# Patient Record
Sex: Male | Born: 1960 | Race: Black or African American | Hispanic: No | Smoking: Former smoker
Health system: Southern US, Community
[De-identification: ages and names within clinical notes are randomized; demographics above are authoritative.]

## PROBLEM LIST (undated history)

## (undated) DIAGNOSIS — I10 Essential (primary) hypertension: Secondary | ICD-10-CM

## (undated) DIAGNOSIS — I480 Paroxysmal atrial fibrillation: Secondary | ICD-10-CM

## (undated) DIAGNOSIS — T8610 Unspecified complication of kidney transplant: Secondary | ICD-10-CM

## (undated) DIAGNOSIS — Z8639 Personal history of other endocrine, nutritional and metabolic disease: Secondary | ICD-10-CM

## (undated) DIAGNOSIS — I2510 Atherosclerotic heart disease of native coronary artery without angina pectoris: Secondary | ICD-10-CM

## (undated) DIAGNOSIS — Z87438 Personal history of other diseases of male genital organs: Secondary | ICD-10-CM

## (undated) DIAGNOSIS — D631 Anemia in chronic kidney disease: Secondary | ICD-10-CM

## (undated) DIAGNOSIS — R112 Nausea with vomiting, unspecified: Secondary | ICD-10-CM

## (undated) DIAGNOSIS — Z992 Dependence on renal dialysis: Secondary | ICD-10-CM

## (undated) DIAGNOSIS — K219 Gastro-esophageal reflux disease without esophagitis: Secondary | ICD-10-CM

## (undated) DIAGNOSIS — I5022 Chronic systolic (congestive) heart failure: Secondary | ICD-10-CM

## (undated) DIAGNOSIS — R011 Cardiac murmur, unspecified: Secondary | ICD-10-CM

## (undated) DIAGNOSIS — N186 End stage renal disease: Secondary | ICD-10-CM

## (undated) DIAGNOSIS — N189 Chronic kidney disease, unspecified: Secondary | ICD-10-CM

## (undated) DIAGNOSIS — I428 Other cardiomyopathies: Secondary | ICD-10-CM

## (undated) DIAGNOSIS — Z8719 Personal history of other diseases of the digestive system: Secondary | ICD-10-CM

## (undated) HISTORY — PX: BACK SURGERY: SHX140

## (undated) HISTORY — PX: CARDIOVERSION: SHX1299

## (undated) HISTORY — PX: CARDIOVASCULAR STRESS TEST: SHX262

---

## 1999-01-20 HISTORY — PX: ANTERIOR CERVICAL DECOMP/DISCECTOMY FUSION: SHX1161

## 2009-09-21 HISTORY — PX: KIDNEY TRANSPLANT: SHX239

## 2009-10-21 HISTORY — PX: ESOPHAGOGASTRODUODENOSCOPY: SHX1529

## 2009-10-21 HISTORY — PX: COLONOSCOPY W/ POLYPECTOMY: SHX1380

## 2009-10-26 ENCOUNTER — Ambulatory Visit: Payer: Self-pay | Admitting: Internal Medicine

## 2009-10-26 ENCOUNTER — Inpatient Hospital Stay (HOSPITAL_COMMUNITY): Admission: EM | Admit: 2009-10-26 | Discharge: 2009-10-28 | Payer: Self-pay | Admitting: Emergency Medicine

## 2009-10-27 ENCOUNTER — Encounter: Payer: Self-pay | Admitting: Internal Medicine

## 2009-10-29 ENCOUNTER — Encounter: Payer: Self-pay | Admitting: Internal Medicine

## 2011-02-22 LAB — CBC
HCT: 20.2 % — ABNORMAL LOW (ref 39.0–52.0)
HCT: 25.5 % — ABNORMAL LOW (ref 39.0–52.0)
HCT: 27.9 % — ABNORMAL LOW (ref 39.0–52.0)
Hemoglobin: 5.8 g/dL — CL (ref 13.0–17.0)
Hemoglobin: 6.9 g/dL — CL (ref 13.0–17.0)
Hemoglobin: 9.6 g/dL — ABNORMAL LOW (ref 13.0–17.0)
MCHC: 32.9 g/dL (ref 30.0–36.0)
MCHC: 34.3 g/dL (ref 30.0–36.0)
MCHC: 34.5 g/dL (ref 30.0–36.0)
MCHC: 34.6 g/dL (ref 30.0–36.0)
MCV: 93 fL (ref 78.0–100.0)
MCV: 93 fL (ref 78.0–100.0)
MCV: 93.2 fL (ref 78.0–100.0)
MCV: 94.2 fL (ref 78.0–100.0)
MCV: 94.4 fL (ref 78.0–100.0)
Platelets: 110 10*3/uL — ABNORMAL LOW (ref 150–400)
Platelets: 123 10*3/uL — ABNORMAL LOW (ref 150–400)
Platelets: 125 10*3/uL — ABNORMAL LOW (ref 150–400)
Platelets: 126 10*3/uL — ABNORMAL LOW (ref 150–400)
Platelets: 202 10*3/uL (ref 150–400)
RBC: 2.8 MIL/uL — ABNORMAL LOW (ref 4.22–5.81)
RDW: 16.2 % — ABNORMAL HIGH (ref 11.5–15.5)
RDW: 16.2 % — ABNORMAL HIGH (ref 11.5–15.5)
RDW: 16.2 % — ABNORMAL HIGH (ref 11.5–15.5)
RDW: 19.3 % — ABNORMAL HIGH (ref 11.5–15.5)
WBC: 5.8 10*3/uL (ref 4.0–10.5)
WBC: 7.6 10*3/uL (ref 4.0–10.5)
WBC: 8 10*3/uL (ref 4.0–10.5)

## 2011-02-22 LAB — COMPREHENSIVE METABOLIC PANEL
Albumin: 2.9 g/dL — ABNORMAL LOW (ref 3.5–5.2)
Alkaline Phosphatase: 107 U/L (ref 39–117)
BUN: 57 mg/dL — ABNORMAL HIGH (ref 6–23)
Creatinine, Ser: 3.05 mg/dL — ABNORMAL HIGH (ref 0.4–1.5)
Potassium: 5.9 mEq/L — ABNORMAL HIGH (ref 3.5–5.1)
Total Protein: 5.3 g/dL — ABNORMAL LOW (ref 6.0–8.3)

## 2011-02-22 LAB — CROSSMATCH: Antibody Screen: NEGATIVE

## 2011-02-22 LAB — URINALYSIS, ROUTINE W REFLEX MICROSCOPIC
Glucose, UA: NEGATIVE mg/dL
Specific Gravity, Urine: 1.014 (ref 1.005–1.030)
Urobilinogen, UA: 0.2 mg/dL (ref 0.0–1.0)

## 2011-02-22 LAB — APTT: aPTT: 24 seconds (ref 24–37)

## 2011-02-22 LAB — URINE MICROSCOPIC-ADD ON

## 2011-02-22 LAB — POCT I-STAT, CHEM 8
Calcium, Ion: 1.49 mmol/L — ABNORMAL HIGH (ref 1.12–1.32)
Chloride: 120 mEq/L — ABNORMAL HIGH (ref 96–112)
Glucose, Bld: 133 mg/dL — ABNORMAL HIGH (ref 70–99)
HCT: 17 % — ABNORMAL LOW (ref 39.0–52.0)
TCO2: 13 mmol/L (ref 0–100)

## 2011-02-22 LAB — DIFFERENTIAL
Lymphs Abs: 0.3 10*3/uL — ABNORMAL LOW (ref 0.7–4.0)
Monocytes Relative: 6 % (ref 3–12)
Neutro Abs: 8.7 10*3/uL — ABNORMAL HIGH (ref 1.7–7.7)
Neutrophils Relative %: 89 % — ABNORMAL HIGH (ref 43–77)

## 2011-02-22 LAB — RENAL FUNCTION PANEL
Albumin: 2.5 g/dL — ABNORMAL LOW (ref 3.5–5.2)
Calcium: 9.2 mg/dL (ref 8.4–10.5)
Creatinine, Ser: 2.95 mg/dL — ABNORMAL HIGH (ref 0.4–1.5)
GFR calc Af Amer: 28 mL/min — ABNORMAL LOW (ref >60)
GFR calc non Af Amer: 23 mL/min — ABNORMAL LOW (ref >60)
Phosphorus: 3.3 mg/dL (ref 2.3–4.6)

## 2011-02-22 LAB — BASIC METABOLIC PANEL
Calcium: 9.2 mg/dL (ref 8.4–10.5)
Calcium: 9.4 mg/dL (ref 8.4–10.5)
Chloride: 115 mEq/L — ABNORMAL HIGH (ref 96–112)
Creatinine, Ser: 3.02 mg/dL — ABNORMAL HIGH (ref 0.4–1.5)
Creatinine, Ser: 3.1 mg/dL — ABNORMAL HIGH (ref 0.4–1.5)
GFR calc Af Amer: 26 mL/min — ABNORMAL LOW (ref >60)
GFR calc non Af Amer: 22 mL/min — ABNORMAL LOW (ref >60)
Glucose, Bld: 102 mg/dL — ABNORMAL HIGH (ref 70–99)
Sodium: 136 mEq/L (ref 135–145)
Sodium: 138 mEq/L (ref 135–145)

## 2011-02-22 LAB — MRSA PCR SCREENING

## 2011-02-22 LAB — GLUCOSE, CAPILLARY: Glucose-Capillary: 83 mg/dL (ref 70–99)

## 2011-02-22 LAB — PROTIME-INR: INR: 1.41 (ref 0.00–1.49)

## 2011-02-22 LAB — HAPTOGLOBIN: Haptoglobin: 127 mg/dL (ref 16–200)

## 2013-11-21 HISTORY — PX: RENAL BIOPSY: SHX156

## 2013-12-02 ENCOUNTER — Encounter (HOSPITAL_COMMUNITY): Payer: Self-pay | Admitting: Emergency Medicine

## 2013-12-02 ENCOUNTER — Emergency Department (INDEPENDENT_AMBULATORY_CARE_PROVIDER_SITE_OTHER): Payer: Medicare Other

## 2013-12-02 ENCOUNTER — Emergency Department (INDEPENDENT_AMBULATORY_CARE_PROVIDER_SITE_OTHER)
Admission: EM | Admit: 2013-12-02 | Discharge: 2013-12-02 | Disposition: A | Discharge status: Home / Self Care | Payer: Medicare Other | Source: Home / Self Care | Attending: Emergency Medicine | Admitting: Emergency Medicine

## 2013-12-02 DIAGNOSIS — S93409A Sprain of unspecified ligament of unspecified ankle, initial encounter: Secondary | ICD-10-CM

## 2013-12-02 DIAGNOSIS — Y939 Activity, unspecified: Secondary | ICD-10-CM

## 2013-12-02 DIAGNOSIS — X500XXA Overexertion from strenuous movement or load, initial encounter: Secondary | ICD-10-CM

## 2013-12-02 MED ORDER — HYDROCODONE-ACETAMINOPHEN 5-325 MG PO TABS: 1.0000 | ORAL_TABLET | Freq: Four times a day (QID) | ORAL | Status: DC | PRN

## 2013-12-02 NOTE — ED Provider Notes (Signed)
Medical screening examination/treatment/procedure(s) were performed by non-physician practitioner and as supervising physician I was immediately available for consultation/collaboration.  Leslee Homeavid Lionardo Haze, M.D.  Reuben Likesavid C Cherokee Boccio, MD 12/02/13 [phone]

## 2013-12-02 NOTE — ED Notes (Signed)
Reports injury to right ankle.  States that over the past two weeks when stepping down ankle has rolled several times. Now having pain and swelling  Also c/o pain in right shoulder. Hx of collar bone fracture. Denies injury.  Pain with rotating arm inward.  Pt has tried otc meds for pain with no relief.

## 2013-12-02 NOTE — Discharge Instructions (Signed)
Ankle Sprain °An ankle sprain is an injury to the strong, fibrous tissues (ligaments) that hold the bones of your ankle joint together.  °CAUSES °An ankle sprain is usually caused by a fall or by twisting your ankle. Ankle sprains most commonly occur when you step on the outer edge of your foot, and your ankle turns inward. People who participate in sports are more prone to these types of injuries.  °SYMPTOMS  °· Pain in your ankle. The pain may be present at rest or only when you are trying to stand or walk. °· Swelling. °· Bruising. Bruising may develop immediately or within 1 to 2 days after your injury. °· Difficulty standing or walking, particularly when turning corners or changing directions. °DIAGNOSIS  °Your caregiver will ask you details about your injury and perform a physical exam of your ankle to determine if you have an ankle sprain. During the physical exam, your caregiver will press on and apply pressure to specific areas of your foot and ankle. Your caregiver will try to move your ankle in certain ways. An X-ray exam may be done to be sure a bone was not broken or a ligament did not separate from one of the bones in your ankle (avulsion fracture).  °TREATMENT  °Certain types of braces can help stabilize your ankle. Your caregiver can make a recommendation for this. Your caregiver may recommend the use of medicine for pain. If your sprain is severe, your caregiver may refer you to a surgeon who helps to restore function to parts of your skeletal system (orthopedist) or a physical therapist. °HOME CARE INSTRUCTIONS  °· Apply ice to your injury for 1 2 days or as directed by your caregiver. Applying ice helps to reduce inflammation and pain. °· Put ice in a plastic bag. °· Place a towel between your skin and the bag. °· Leave the ice on for 15-20 minutes at a time, every 2 hours while you are awake. °· Only take over-the-counter or prescription medicines for pain, discomfort, or fever as directed by  your caregiver. °· Elevate your injured ankle above the level of your heart as much as possible for 2 3 days. °· If your caregiver recommends crutches, use them as instructed. Gradually put weight on the affected ankle. Continue to use crutches or a cane until you can walk without feeling pain in your ankle. °· If you have a plaster splint, wear the splint as directed by your caregiver. Do not rest it on anything harder than a pillow for the first 24 hours. Do not put weight on it. Do not get it wet. You may take it off to take a shower or bath. °· You may have been given an elastic bandage to wear around your ankle to provide support. If the elastic bandage is too tight (you have numbness or tingling in your foot or your foot becomes cold and blue), adjust the bandage to make it comfortable. °· If you have an air splint, you may blow more air into it or let air out to make it more comfortable. You may take your splint off at night and before taking a shower or bath. Wiggle your toes in the splint several times per day to decrease swelling. °SEEK MEDICAL CARE IF:  °· You have rapidly increasing bruising or swelling. °· Your toes feel extremely cold or you lose feeling in your foot. °· Your pain is not relieved with medicine. °SEEK IMMEDIATE MEDICAL CARE IF: °· Your toes are numb   or blue.  You have severe pain that is increasing. MAKE SURE YOU:   Understand these instructions.  Will watch your condition.  Will get help right away if you are not doing well or get worse. Document Released: 11/07/2005 Document Revised: 08/01/2012 Document Reviewed: 11/19/2011 [facility] Patient Information 2014 [employer], [place].  Contact Dr. Ophelia CharterYates for follow up evaluation. Wear splint and shoe during daytime and use crutches during daytime. Remove splint and shoe at night while sleeping. Ice and elevated injured ankle 2-3 x day.

## 2013-12-02 NOTE — ED Provider Notes (Signed)
CSN: [id number]     Arrival date & time 12/02/13  1302 History   First MD Initiated Contact with Patient 12/02/13 1348     Chief Complaint  Patient presents with  . Ankle Injury  . Shoulder Pain   (Consider location/radiation/quality/duration/timing/severity/associated sxs/prior Treatment) HPI Comments: States he has "rolled this ankle" a few times over the past 2 weeks and this morning while stepping out of his truck, he stepped down on a rock and began to drop something. In the process of trying to catch what he dropped, he again injured his right ankle, pronating right foot. Denies previous surgery.   Patient is a 53 y.o. male presenting with lower extremity injury and shoulder pain. The history is provided by the patient.  Ankle Injury  Shoulder Pain    History reviewed. No pertinent past medical history. Past Surgical History  Procedure Laterality Date  . Nephrectomy transplanted organ    . Back surgery     History reviewed. No pertinent family history. History  Substance Use Topics  . Smoking status: Never Smoker   . Smokeless tobacco: Not on file  . Alcohol Use: No    Review of Systems  All other systems reviewed and are negative.    Allergies  Review of patient's allergies indicates no known allergies.  Home Medications   Current Outpatient Rx  Name  Route  Sig  Dispense  Refill  . cholecalciferol (VITAMIN D) 1000 UNITS tablet   Oral   Take 1,000 Units by mouth daily.         Marland Kitchen. esomeprazole (NEXIUM) 40 MG capsule   Oral   Take 40 mg by mouth daily at 12 noon.         Marland Kitchen. lisinopril (PRINIVIL,ZESTRIL) 5 MG tablet   Oral   Take 5 mg by mouth daily.         . prednisoLONE 5 MG TABS tablet   Oral   Take by mouth.         Marland Kitchen. TACROLIMUS PO   Oral   Take by mouth.          BP 153/71  Pulse 90  Temp(Src) 98.8 F (37.1 C) (Oral)  Resp 16  SpO2 100% Physical Exam  Nursing note and vitals reviewed. Constitutional: He is oriented to person,  place, and time. He appears well-developed and well-nourished. No distress.  HENT:  Head: Normocephalic and atraumatic.  Eyes: Conjunctivae are normal.  Cardiovascular: Normal rate.   Pulmonary/Chest: Effort normal.  Musculoskeletal: Normal range of motion.       Right ankle: He exhibits swelling. He exhibits normal range of motion, no ecchymosis, no deformity, no laceration and normal pulse. Tenderness. Lateral malleolus tenderness found. No medial malleolus tenderness found. Achilles tendon normal.       Feet:  Neurological: He is alert and oriented to person, place, and time.  Skin: Skin is warm and dry.  +intact  Psychiatric: He has a normal mood and affect. His behavior is normal.    ED Course  Procedures (including critical care time) Labs Review Labs Reviewed - No data to display Imaging Review No results found.  EKG Interpretation    Date/Time:    Ventricular Rate:    PR Interval:    QRS Duration:   QT Interval:    QTC Calculation:   R Axis:     Text Interpretation:              MDM  Xrays suggestive of ankle sprain  with small avulsion injury to calcaneal bone spur. Advised patient ice, elevation, crutches as needed and follow up with Dr. Ophelia Charter. ASO and post op shoe applied at [facility]. Referral to Dr. Ophelia Charter for follow up.     Jess Barters [place], [place] 12/02/13 1452

## 2013-12-04 NOTE — ED Notes (Signed)
[facility] and asked for the name or the doctor to whom we referred and the phone number.  D/C instructions reprinted and given to the pt. Vassie MoselleYork, Sonja Manseau M 12/04/2013

## 2013-12-13 ENCOUNTER — Emergency Department (INDEPENDENT_AMBULATORY_CARE_PROVIDER_SITE_OTHER)
Admission: EM | Admit: 2013-12-13 | Discharge: 2013-12-13 | Disposition: A | Discharge status: Home / Self Care | Payer: Medicare Other | Source: Home / Self Care

## 2013-12-13 ENCOUNTER — Encounter (HOSPITAL_COMMUNITY): Payer: Self-pay | Admitting: Emergency Medicine

## 2013-12-13 DIAGNOSIS — L02419 Cutaneous abscess of limb, unspecified: Secondary | ICD-10-CM

## 2013-12-13 DIAGNOSIS — L02416 Cutaneous abscess of left lower limb: Secondary | ICD-10-CM

## 2013-12-13 DIAGNOSIS — L03119 Cellulitis of unspecified part of limb: Secondary | ICD-10-CM

## 2013-12-13 MED ORDER — CLINDAMYCIN HCL 150 MG PO CAPS: 300.0000 mg | ORAL_CAPSULE | Freq: Two times a day (BID) | ORAL | Status: DC

## 2013-12-13 MED ORDER — TRAMADOL HCL 50 MG PO TABS: 50.0000 mg | ORAL_TABLET | Freq: Four times a day (QID) | ORAL | Status: DC | PRN

## 2013-12-13 NOTE — Discharge Instructions (Signed)
Abscess °An abscess is an infected area that contains a collection of pus and debris. It can occur in almost any part of the body. An abscess is also known as a furuncle or boil. °CAUSES  °An abscess occurs when tissue gets infected. This can occur from blockage of oil or sweat glands, infection of hair follicles, or a minor injury to the skin. As the body tries to fight the infection, pus collects in the area and creates pressure under the skin. This pressure causes pain. People with weakened immune systems have difficulty fighting infections and get certain abscesses more often.  °SYMPTOMS °Usually an abscess develops on the skin and becomes a painful mass that is red, warm, and tender. If the abscess forms under the skin, you may feel a moveable soft area under the skin. Some abscesses break open (rupture) on their own, but most will continue to get worse without care. The infection can spread deeper into the body and eventually into the bloodstream, causing you to feel ill.  °DIAGNOSIS  °Your caregiver will take your medical history and perform a physical exam. A sample of fluid may also be taken from the abscess to determine what is causing your infection. °TREATMENT  °Your caregiver may prescribe antibiotic medicines to fight the infection. However, taking antibiotics alone usually does not cure an abscess. Your caregiver may need to make a small cut (incision) in the abscess to drain the pus. In some cases, gauze is packed into the abscess to reduce pain and to continue draining the area. °HOME CARE INSTRUCTIONS  °· Only take over-the-counter or prescription medicines for pain, discomfort, or fever as directed by your caregiver. °· If you were prescribed antibiotics, take them as directed. Finish them even if you start to feel better. °· If gauze is used, follow your caregiver's directions for changing the gauze. °· To avoid spreading the infection: °· Keep your draining abscess covered with a  bandage. °· Wash your hands well. °· Do not share personal care items, towels, or whirlpools with others. °· Avoid skin contact with others. °· Keep your skin and clothes clean around the abscess. °· Keep all follow-up appointments as directed by your caregiver. °SEEK MEDICAL CARE IF:  °· You have increased pain, swelling, redness, fluid drainage, or bleeding. °· You have muscle aches, chills, or a general ill feeling. °· You have a fever. °MAKE SURE YOU:  °· Understand these instructions. °· Will watch your condition. °· Will get help right away if you are not doing well or get worse. °Document Released: 08/17/2005 Document Revised: 05/08/2012 Document Reviewed: 01/20/2012 °ExitCare® Patient Information ©2014 [employer], LLC. ° °Cellulitis °Cellulitis is an infection of the skin and the tissue under the skin. The infected area is usually red and tender. This happens most often in the arms and lower legs. °HOME CARE  °· Take your antibiotic medicine as told. Finish the medicine even if you start to feel better. °· Keep the infected arm or leg raised (elevated). °· Put a warm cloth on the area up to 4 times per day. °· Only take medicines as told by your doctor. °· Keep all doctor visits as told. °GET HELP RIGHT AWAY IF:  °· You have a fever. °· You feel very sleepy. °· You throw up (vomit) or have watery poop (diarrhea). °· You feel sick and have muscle aches and pains. °· You see red streaks on the skin coming from the infected area. °· Your red area gets bigger or turns   a dark color. °· Your bone or joint under the infected area is painful after the skin heals. °· Your infection comes back in the same area or different area. °· You have a puffy (swollen) bump in the infected area. °· You have new symptoms. °MAKE SURE YOU:  °· Understand these instructions. °· Will watch your condition. °· Will get help right away if you are not doing well or get worse. °Document Released: 04/25/2008 Document Revised: 05/08/2012  Document Reviewed: 01/23/2012 °ExitCare® Patient Information ©2014 [employer], LLC. ° °

## 2013-12-13 NOTE — ED Notes (Signed)
Discussed importance of prompt attention for infections in future due to transplant history

## 2013-12-13 NOTE — ED Notes (Signed)
States 3-5 day duration of red sore area on left thigh. Has been using warm compresses. Area aprox 10 cm measured of redness, pain w palpation, hot to touch

## 2013-12-13 NOTE — ED Provider Notes (Signed)
CSN: [id number]     Arrival date & time 12/13/13  1230 History   First MD Initiated Contact with Patient 12/13/13 1339     Chief Complaint  Patient presents with  . Cellulitis   (Consider location/radiation/quality/duration/timing/severity/associated sxs/prior Treatment) HPI Comments: 53 year old male presents with redness and soreness to the left posterior thigh. It began as a small pustule 3 days ago but has increased in size with induration, tenderness and erythema. He denies other areas of tenderness or erythema. Denies systemic symptoms. Patient had a kidney transplant approximate 5 years ago and is currently taking Tacrolimus and Prednisone.   History reviewed. No pertinent past medical history. Past Surgical History  Procedure Laterality Date  . Nephrectomy transplanted organ    . Back surgery     History reviewed. No pertinent family history. History  Substance Use Topics  . Smoking status: Never Smoker   . Smokeless tobacco: Not on file  . Alcohol Use: No    Review of Systems  Skin: Positive for wound.       As per history of present illness  All other systems reviewed and are negative.    Allergies  Review of patient's allergies indicates no known allergies.  Home Medications   Current Outpatient Rx  Name  Route  Sig  Dispense  Refill  . cholecalciferol (VITAMIN D) 1000 UNITS tablet   Oral   Take 1,000 Units by mouth daily.         . clindamycin (CLEOCIN) 150 MG capsule   Oral   Take 2 capsules (300 mg total) by mouth 2 (two) times daily. X 10 days   20 capsule   0   . esomeprazole (NEXIUM) 40 MG capsule   Oral   Take 40 mg by mouth daily at 12 noon.         Marland Kitchen HYDROcodone-acetaminophen (NORCO/VICODIN) 5-325 MG per tablet   Oral   Take 1 tablet by mouth every 6 (six) hours as needed for moderate pain or severe pain.   15 tablet   0   . lisinopril (PRINIVIL,ZESTRIL) 5 MG tablet   Oral   Take 5 mg by mouth daily.         . prednisoLONE 5 MG  TABS tablet   Oral   Take by mouth.         Marland Kitchen TACROLIMUS PO   Oral   Take by mouth.         . traMADol (ULTRAM) 50 MG tablet   Oral   Take 1 tablet (50 mg total) by mouth every 6 (six) hours as needed.   20 tablet   0    BP 138/78  Pulse 87  Temp(Src) 98.2 F (36.8 C) (Oral)  Resp 20  SpO2 99% Physical Exam  Nursing note and vitals reviewed. Constitutional: He is oriented to person, place, and time. He appears well-developed and well-nourished. No distress.  Neck: Normal range of motion. Neck supple.  Cardiovascular: Normal rate.   Pulmonary/Chest: Effort normal. No respiratory distress.  Musculoskeletal: He exhibits no edema.  Neurological: He is alert and oriented to person, place, and time. He exhibits normal muscle tone.  Skin: Skin is warm and dry.  9 x 9 cm anular red, indurated lesion to the L posterior thigh. No lymphangitis or current drainage.   Psychiatric: He has a normal mood and affect.    ED Course  INCISION AND DRAINAGE Date/Time: 12/13/2013 3:06 PM Performed by: Phineas Real, Candid Bovey Authorized by: Bradd Canary D Consent:  Verbal consent obtained. Risks and benefits: risks, benefits and alternatives were discussed Consent given by: patient Patient understanding: patient states understanding of the procedure being performed Patient consent: the patient's understanding of the procedure matches consent given Procedure consent: procedure consent matches procedure scheduled Patient identity confirmed: verbally with patient and provided demographic data Time out: Immediately prior to procedure a "time out" was called to verify the correct patient, procedure, equipment, support staff and site/side marked as required. Type: abscess Body area: lower extremity Location details: left leg Anesthesia: local infiltration Local anesthetic: lidocaine 2% without epinephrine Anesthetic total: 10 ml Patient sedated: no Scalpel size: 11 Needle gauge: 20 Incision type:  single straight Complexity: simple Drainage: purulent and bloody Drainage amount: moderate Wound treatment: wound left open Packing material: 1/4 in iodoform gauze (approx 4 inches) Patient tolerance: Patient tolerated the procedure well with no immediate complications. Comments: Assisted by C. Street, MD (PGY-2, [facility])   (including critical care time) Labs Review Labs Reviewed - No data to display Imaging Review No results found.    MDM   1. Abscess of left leg     Clindamycin 3 mg twice a day for 10 days Tramadol 50 mg every 6 hours when necessary pain Culture wound, results pending Recheck in 2 days Procedure performed by Dr. Burnis Medinhris Street    Hayden Rasmussenavid Makynlee Kressin, NP 12/13/13 [phone]

## 2013-12-15 NOTE — ED Provider Notes (Signed)
Medical screening examination/treatment/procedure(s) were performed by resident physician or non-physician practitioner and as supervising physician I was immediately available for consultation/collaboration.   KINDL,JAMES DOUGLAS MD.   James D Kindl, MD 12/15/13 1147 

## 2013-12-16 ENCOUNTER — Emergency Department (INDEPENDENT_AMBULATORY_CARE_PROVIDER_SITE_OTHER)
Admission: EM | Admit: 2013-12-16 | Discharge: 2013-12-16 | Disposition: A | Discharge status: Home / Self Care | Payer: Medicare Other | Source: Home / Self Care | Attending: Family Medicine | Admitting: Family Medicine

## 2013-12-16 ENCOUNTER — Encounter (HOSPITAL_COMMUNITY): Payer: Self-pay | Admitting: Emergency Medicine

## 2013-12-16 DIAGNOSIS — Z4800 Encounter for change or removal of nonsurgical wound dressing: Secondary | ICD-10-CM

## 2013-12-16 DIAGNOSIS — L02415 Cutaneous abscess of right lower limb: Secondary | ICD-10-CM

## 2013-12-16 LAB — CULTURE, ROUTINE-ABSCESS

## 2013-12-16 NOTE — Discharge Instructions (Signed)
Thank you for coming in today. Continue the antibiotics.  FOllow up with your doctor  Abscess An abscess is an infected area that contains a collection of pus and debris.It can occur in almost any part of the body. An abscess is also known as a furuncle or boil. CAUSES  An abscess occurs when tissue gets infected. This can occur from blockage of oil or sweat glands, infection of hair follicles, or a minor injury to the skin. As the body tries to fight the infection, pus collects in the area and creates pressure under the skin. This pressure causes pain. People with weakened immune systems have difficulty fighting infections and get certain abscesses more often.  SYMPTOMS Usually an abscess develops on the skin and becomes a painful mass that is red, warm, and tender. If the abscess forms under the skin, you may feel a moveable soft area under the skin. Some abscesses break open (rupture) on their own, but most will continue to get worse without care. The infection can spread deeper into the body and eventually into the bloodstream, causing you to feel ill.  DIAGNOSIS  Your caregiver will take your medical history and perform a physical exam. A sample of fluid may also be taken from the abscess to determine what is causing your infection. TREATMENT  Your caregiver may prescribe antibiotic medicines to fight the infection. However, taking antibiotics alone usually does not cure an abscess. Your caregiver may need to make a small cut (incision) in the abscess to drain the pus. In some cases, gauze is packed into the abscess to reduce pain and to continue draining the area. HOME CARE INSTRUCTIONS   Only take over-the-counter or prescription medicines for pain, discomfort, or fever as directed by your caregiver.  If you were prescribed antibiotics, take them as directed. Finish them even if you start to feel better.  If gauze is used, follow your caregiver's directions for changing the gauze.  To  avoid spreading the infection:  Keep your draining abscess covered with a bandage.  Wash your hands well.  Do not share personal care items, towels, or whirlpools with others.  Avoid skin contact with others.  Keep your skin and clothes clean around the abscess.  Keep all follow-up appointments as directed by your caregiver. SEEK MEDICAL CARE IF:   You have increased pain, swelling, redness, fluid drainage, or bleeding.  You have muscle aches, chills, or a general ill feeling.  You have a fever. MAKE SURE YOU:   Understand these instructions.  Will watch your condition.  Will get help right away if you are not doing well or get worse. Document Released: 08/17/2005 Document Revised: 05/08/2012 Document Reviewed: 01/20/2012 [facility] Patient Information 2014 [employer]Care, [place].

## 2013-12-16 NOTE — ED Notes (Signed)
Follow up to abscess packing, patient here for packing removal

## 2013-12-16 NOTE — ED Provider Notes (Signed)
Samuel PennaClayton Rowland is a 53 y.o. male who presents to Urgent Care today for followup left thigh abscess. Patient was seen on 1/23 and treated with incision and drainage and packing. He was treated with clindamycin antibiotics. Culture results show MRSA sensitive to clindamycin. Patient noted significant improvement in pain. No fevers chills nausea vomiting or diarrhea.  Past medical history significant for kidney transplant   History reviewed. No pertinent past medical history. History  Substance Use Topics  . Smoking status: Never Smoker   . Smokeless tobacco: Not on file  . Alcohol Use: No   ROS as above Medications: No current facility-administered medications for this encounter.   Current Outpatient Prescriptions  Medication Sig Dispense Refill  . cholecalciferol (VITAMIN D) 1000 UNITS tablet Take 1,000 Units by mouth daily.      . clindamycin (CLEOCIN) 150 MG capsule Take 2 capsules (300 mg total) by mouth 2 (two) times daily. X 10 days  20 capsule  0  . esomeprazole (NEXIUM) 40 MG capsule Take 40 mg by mouth daily at 12 noon.      Marland Kitchen. HYDROcodone-acetaminophen (NORCO/VICODIN) 5-325 MG per tablet Take 1 tablet by mouth every 6 (six) hours as needed for moderate pain or severe pain.  15 tablet  0  . lisinopril (PRINIVIL,ZESTRIL) 5 MG tablet Take 5 mg by mouth daily.      . prednisoLONE 5 MG TABS tablet Take by mouth.      Marland Kitchen. TACROLIMUS PO Take by mouth.      . traMADol (ULTRAM) 50 MG tablet Take 1 tablet (50 mg total) by mouth every 6 (six) hours as needed.  20 tablet  0    Exam:  BP 153/83  Pulse 74  Temp(Src) 98.5 F (36.9 C) (Oral)  Resp 16  SpO2 100% Gen: Well NAD LEFT THIGH ABSCESS: Well appearing packing intact.  No surrounding induration or erythema. Nontender. Packing was removed   Assessment and Plan: 53 y.o. male with resolving left thigh abscess. Continue antibiotics. Followup with primary care provider.  Discussed warning signs or symptoms. Please see discharge  instructions. Patient expresses understanding.    Rodolph BongEvan S Corey, MD 12/16/13 [phone]

## 2013-12-17 NOTE — ED Notes (Signed)
Abscess culture L thigh:  Mod. MRSA.  Pt. adequately treated with Clindamycin.  Needs notified. Vassie MoselleYork, [name] 12/17/2013

## 2013-12-18 ENCOUNTER — Telehealth (HOSPITAL_COMMUNITY): Payer: Self-pay | Admitting: *Deleted

## 2013-12-18 NOTE — ED Notes (Signed)
Abscess culture L thigh: Mod. MRSA.  I called pt. Pt. verified x 2 and given result.  Pt. told he was adequately treated with Clindamycin.  Pt. said the doctor told him when he came in for his recheck. I reviewed the [facility] MRSA instructions with him.  Pt. voiced understanding. Vassie MoselleYork, Navy Rothschild M 12/18/2013

## 2014-01-15 ENCOUNTER — Encounter (HOSPITAL_COMMUNITY): Payer: Self-pay | Admitting: Emergency Medicine

## 2014-01-15 ENCOUNTER — Emergency Department (HOSPITAL_COMMUNITY)
Admission: EM | Admit: 2014-01-15 | Discharge: 2014-01-15 | Disposition: A | Discharge status: Home / Self Care | Payer: Medicare Other | Attending: Emergency Medicine | Admitting: Emergency Medicine

## 2014-01-15 DIAGNOSIS — I10 Essential (primary) hypertension: Secondary | ICD-10-CM | POA: Insufficient documentation

## 2014-01-15 DIAGNOSIS — Z79899 Other long term (current) drug therapy: Secondary | ICD-10-CM | POA: Insufficient documentation

## 2014-01-15 DIAGNOSIS — Z87448 Personal history of other diseases of urinary system: Secondary | ICD-10-CM | POA: Insufficient documentation

## 2014-01-15 DIAGNOSIS — L02214 Cutaneous abscess of groin: Secondary | ICD-10-CM

## 2014-01-15 DIAGNOSIS — IMO0002 Reserved for concepts with insufficient information to code with codable children: Secondary | ICD-10-CM | POA: Insufficient documentation

## 2014-01-15 DIAGNOSIS — L02219 Cutaneous abscess of trunk, unspecified: Secondary | ICD-10-CM | POA: Insufficient documentation

## 2014-01-15 DIAGNOSIS — L03319 Cellulitis of trunk, unspecified: Principal | ICD-10-CM

## 2014-01-15 HISTORY — DX: Unspecified complication of kidney transplant: T86.10

## 2014-01-15 HISTORY — DX: Essential (primary) hypertension: I10

## 2014-01-15 MED ORDER — OXYCODONE-ACETAMINOPHEN 5-325 MG PO TABS
1.0000 | ORAL_TABLET | Freq: Once | ORAL | Status: AC
Start: 2014-01-15 — End: 2014-01-15
  Administered 2014-01-15: 1 via ORAL
  Filled 2014-01-15: qty 1

## 2014-01-15 MED ORDER — DOXYCYCLINE HYCLATE 100 MG PO CAPS: 100.0000 mg | ORAL_CAPSULE | Freq: Two times a day (BID) | ORAL | Status: DC

## 2014-01-15 MED ORDER — OXYCODONE-ACETAMINOPHEN 5-325 MG PO TABS: 1.0000 | ORAL_TABLET | Freq: Four times a day (QID) | ORAL | Status: DC | PRN

## 2014-01-15 NOTE — ED Provider Notes (Signed)
CSN: [id number]     Arrival date & time 01/15/14  1326 History   First MD Initiated Contact with Patient 01/15/14 1410     Chief Complaint  Patient presents with  . Wound Infection     (Consider location/radiation/quality/duration/timing/severity/associated sxs/prior Treatment) HPI Patient presents to the emergency department with an abscess to his left carotid.  Patient, states, that he's noticed the area getting worse over the last 4 days.  The patient, states he tried to press on the area himself.  Patient denies fevers, nausea, vomiting, diarrhea, headache, blurred vision, weakness, dizziness, or syncope Past Medical History  Diagnosis Date  . Renal disorder   . Kidney transplant as cause of abnormal reaction or later complication   . Hypertension    Past Surgical History  Procedure Laterality Date  . Nephrectomy transplanted organ    . Back surgery     No family history on file. History  Substance Use Topics  . Smoking status: Never Smoker   . Smokeless tobacco: Not on file  . Alcohol Use: No    Review of Systems All other systems negative except as documented in the HPI. All pertinent positives and negatives as reviewed in the HPI.   Allergies  Review of patient's allergies indicates no known allergies.  Home Medications   Current Outpatient Rx  Name  Route  Sig  Dispense  Refill  . esomeprazole (NEXIUM) 40 MG capsule   Oral   Take 40 mg by mouth daily as needed (for acid reflux).          Marland Kitchen lisinopril (PRINIVIL,ZESTRIL) 5 MG tablet   Oral   Take 2.5 mg by mouth 2 (two) times daily.          . mycophenolate (MYFORTIC) 180 MG EC tablet   Oral   Take 720 mg by mouth 2 (two) times daily.         . predniSONE (DELTASONE) 5 MG tablet   Oral   Take 5 mg by mouth daily with breakfast.         . tacrolimus (PROGRAF) 1 MG capsule   Oral   Take 4 mg by mouth 2 (two) times daily.          BP 171/96  Pulse 92  Temp(Src) 98.2 F (36.8 C) (Oral)   Resp 18  Wt 210 lb (95.255 kg)  SpO2 100% Physical Exam  Nursing note and vitals reviewed. Constitutional: He appears well-developed and well-nourished. No distress.  HENT:  Head: Normocephalic and atraumatic.  Mouth/Throat: Oropharynx is clear and moist.  Neck: Normal range of motion. Neck supple.  Cardiovascular: Normal rate and regular rhythm.   Pulmonary/Chest: Effort normal and breath sounds normal.  Skin:  Aisha has an area that is 3 cm x 4 cm of fluctuance in the left groin with surrounding cellulitis    ED Course  Procedures (including critical care time) INCISION AND DRAINAGE Performed by: Carlyle Dolly Consent: Verbal consent obtained. Risks and benefits: risks, benefits and alternatives were discussed Type: abscess  Body area: Left groin  Anesthesia: local infiltration  Incision was made with a scalpel.  Local anesthetic: lidocaine 2 % with epinephrine  Anesthetic total: 7 ml  Complexity: complex Blunt dissection to break up loculations  Drainage: purulent  Drainage amount: Moderate   Packing material: 1/4 in iodoform gauze  Patient tolerance: Patient tolerated the procedure well with no immediate complications.   She is advised to return here in 2 days for recheck.  He is  also advised to followup with general surgery for a recheck.  He will be placed on antibiotics due to the surrounding cellulitis.   Carlyle DollyChristopher W Toribio Seiber, PA-C 01/15/14 [phone]

## 2014-01-15 NOTE — Discharge Instructions (Signed)
Return here as needed.  Followup with the, general surgeon.  Return here in 2 days for recheck

## 2014-01-15 NOTE — ED Notes (Signed)
Has abcess left groin he soaked it and mashed it and now it is worse , pt is kidney transplant 4 years ago.

## 2014-01-15 NOTE — ED Provider Notes (Signed)
Medical screening examination/treatment/procedure(s) were performed by non-physician practitioner and as supervising physician I was immediately available for consultation/collaboration.  EKG Interpretation   None         Layla MawKristen N Daeron Carreno, DO 01/15/14 [phone]

## 2014-01-18 ENCOUNTER — Encounter (HOSPITAL_COMMUNITY): Payer: Self-pay | Admitting: Emergency Medicine

## 2014-01-18 ENCOUNTER — Emergency Department (HOSPITAL_COMMUNITY)
Admission: EM | Admit: 2014-01-18 | Discharge: 2014-01-18 | Disposition: A | Discharge status: Home / Self Care | Payer: Medicare Other | Attending: Emergency Medicine | Admitting: Emergency Medicine

## 2014-01-18 DIAGNOSIS — I129 Hypertensive chronic kidney disease with stage 1 through stage 4 chronic kidney disease, or unspecified chronic kidney disease: Secondary | ICD-10-CM | POA: Insufficient documentation

## 2014-01-18 DIAGNOSIS — Z79899 Other long term (current) drug therapy: Secondary | ICD-10-CM | POA: Insufficient documentation

## 2014-01-18 DIAGNOSIS — IMO0002 Reserved for concepts with insufficient information to code with codable children: Secondary | ICD-10-CM | POA: Insufficient documentation

## 2014-01-18 DIAGNOSIS — L03319 Cellulitis of trunk, unspecified: Principal | ICD-10-CM

## 2014-01-18 DIAGNOSIS — L02219 Cutaneous abscess of trunk, unspecified: Secondary | ICD-10-CM | POA: Insufficient documentation

## 2014-01-18 DIAGNOSIS — N189 Chronic kidney disease, unspecified: Secondary | ICD-10-CM | POA: Insufficient documentation

## 2014-01-18 DIAGNOSIS — L0291 Cutaneous abscess, unspecified: Secondary | ICD-10-CM

## 2014-01-18 DIAGNOSIS — Z940 Kidney transplant status: Secondary | ICD-10-CM | POA: Insufficient documentation

## 2014-01-18 DIAGNOSIS — Z792 Long term (current) use of antibiotics: Secondary | ICD-10-CM | POA: Insufficient documentation

## 2014-01-18 NOTE — Discharge Instructions (Signed)
Abscess °An abscess is an infected area that contains a collection of pus and debris. It can occur in almost any part of the body. An abscess is also known as a furuncle or boil. °CAUSES  °An abscess occurs when tissue gets infected. This can occur from blockage of oil or sweat glands, infection of hair follicles, or a minor injury to the skin. As the body tries to fight the infection, pus collects in the area and creates pressure under the skin. This pressure causes pain. People with weakened immune systems have difficulty fighting infections and get certain abscesses more often.  °SYMPTOMS °Usually an abscess develops on the skin and becomes a painful mass that is red, warm, and tender. If the abscess forms under the skin, you may feel a moveable soft area under the skin. Some abscesses break open (rupture) on their own, but most will continue to get worse without care. The infection can spread deeper into the body and eventually into the bloodstream, causing you to feel ill.  °DIAGNOSIS  °Your caregiver will take your medical history and perform a physical exam. A sample of fluid may also be taken from the abscess to determine what is causing your infection. °TREATMENT  °Your caregiver may prescribe antibiotic medicines to fight the infection. However, taking antibiotics alone usually does not cure an abscess. Your caregiver may need to make a small cut (incision) in the abscess to drain the pus. In some cases, gauze is packed into the abscess to reduce pain and to continue draining the area. °HOME CARE INSTRUCTIONS  °· Only take over-the-counter or prescription medicines for pain, discomfort, or fever as directed by your caregiver. °· If you were prescribed antibiotics, take them as directed. Finish them even if you start to feel better. °· If gauze is used, follow your caregiver's directions for changing the gauze. °· To avoid spreading the infection: °· Keep your draining abscess covered with a  bandage. °· Wash your hands well. °· Do not share personal care items, towels, or whirlpools with others. °· Avoid skin contact with others. °· Keep your skin and clothes clean around the abscess. °· Keep all follow-up appointments as directed by your caregiver. °SEEK MEDICAL CARE IF:  °· You have increased pain, swelling, redness, fluid drainage, or bleeding. °· You have muscle aches, chills, or a general ill feeling. °· You have a fever. °MAKE SURE YOU:  °· Understand these instructions. °· Will watch your condition. °· Will get help right away if you are not doing well or get worse. °Document Released: 08/17/2005 Document Revised: 05/08/2012 Document Reviewed: 01/20/2012 °ExitCare® Patient Information ©2014 [employer]. ° °Community-Associated MRSA °CA-MRSA stands for community-associated methicillin-resistant Staphylococcus aureus. MRSA is a type of bacteria that is resistant to some common antibiotics. It can cause infections in the skin and many other places in the body. Staphylococcus aureus, often called "staph," is a bacteria that normally lives on the skin or in the nose. Staph on the surface of the skin or in the nose does not cause problems. However, if the staph enters the body through a cut, wound, or break in the skin, an infection can happen. °Up until recently, infections with the MRSA type of staph mainly occurred in hospitals and other healthcare settings. There are now increasing problems with MRSA infections in the community as well. Infections with MRSA may be very serious or even life-threatening. °CA-MRSA is becoming more common. It is known to spread in crowded settings, in jails and prisons, and   in situations where there is close skin-to-skin contact, such as during sporting events or in locker rooms. MRSA can be spread through shared items, such as children's toys, razors, towels, or sports equipment.  °CAUSES °All staph, including MRSA, are normally harmless unless they enter the body  through a scratch, cut, or wound, such as with surgery. All staph, including MRSA, can be spread from person-to-person by touching contaminated objects or through direct contact. °· MRSA now causes illness in people who have not been in hospitals or other healthcare facilities. Cases of MRSA diseases in the community have been associated with: °· Recent antibiotic use. °· Sharing contaminated towels or clothes. °· Having active skin diseases. °· Participating in contact sports. °· Living in crowded settings. °· Intravenous (IV) drug use. °· Community-associated MRSA infections are usually skin infections, but may cause other severe illnesses. °· Staph bacteria are one of the most common causes of skin infection. However, they are also a common cause of pneumonia, bone or joint infections, and bloodstream infections. °DIAGNOSIS °Diagnosis of MRSA is done by cultures of fluid samples that may come from: °· Swabs taken from cuts or wounds in infected areas. °· Nasal swabs. °· Saliva or deep cough specimens from the lungs (sputum). °· Urine. °· Blood. °Many people are "colonized" with MRSA but have no signs of infection. This means that people carry the MRSA germ on their skin or in their nose and may never develop MRSA infection.  °TREATMENT  °Treatment varies and is based on how serious, how deep, or how extensive the infection is. For example: °· Some skin infections, such as a small boil or abscess, may be treated by draining yellowish-white fluid (pus) from the site of the infection. °· Deeper or more widespread soft tissue infections are usually treated with surgery to drain pus and with antibiotic medicine given by vein or by mouth. This may be recommended even if you are pregnant. °· Serious infections may require a hospital stay. °If antibiotics are given, they may be needed for several weeks. °PREVENTION °Because many people are colonized with staph, including MRSA, preventing the spread of the bacteria from  person-to-person is most important. The best way to prevent the spread of bacteria and other germs is through proper hand washing or by using alcohol-based hand disinfectants. The following are other ways to help prevent MRSA infection within community settings.  °· Wash your hands frequently with soap and water for at least 15 seconds. Otherwise, use alcohol-based hand disinfectants when soap and water is not available. °· Make sure people who live with you wash their hands often, too. °· Do not share personal items. For example, avoid sharing razors and other personal hygiene items, towels, clothing, and athletic equipment. °· Wash and dry your clothes and bedding at the warmest temperatures recommended on the labels. °· Keep wounds covered. Pus from infected sores may contain MRSA and other bacteria. Keep cuts and abrasions clean and covered with germ-free (sterile), dry bandages until they are healed. °· If you have a wound that appears infected, ask your caregiver if a culture for MRSA and other bacteria should be done. °· If you are breastfeeding, talk to your caregiver about MRSA. You may be asked to temporarily stop breastfeeding. °HOME CARE INSTRUCTIONS  °· Take your antibiotics as directed. Finish them even if you start to feel better. °· Avoid close contact with those around you as much as possible. Do not use towels, razors, toothbrushes, bedding, or other items that   will be used by others. °· To fight the infection, follow your caregiver's instructions for wound care. Wash your hands before and after changing your bandages. °· If you have an intravascular device, such as a catheter, make sure you know how to care for it. °· Be sure to tell any healthcare providers that you have MRSA so they are aware of your infection. °SEEK IMMEDIATE MEDICAL CARE IF: °· The infection appears to be getting worse. Signs include: °· Increased warmth, redness, or tenderness around the wound site. °· A red line that extends  from the infection site. °· A dark color in the area around the infection. °· Wound drainage that is tan, yellow, or green. °· A bad smell coming from the wound. °· You feel sick to your stomach (nauseous) and throw up (vomit) or cannot keep medicine down. °· You have a fever. °· Your baby is older than 3 months with a rectal temperature of 102° F (38.9° C) or higher. °· Your baby is 3 months old or younger with a rectal temperature of 100.4° F (38° C) or higher. °· You have difficulty breathing. °MAKE SURE YOU:  °· Understand these instructions. °· Will watch your condition. °· Will get help right away if you are not doing well or get worse. °Document Released: 02/10/2006 Document Revised: 01/30/2012 Document Reviewed: 02/10/2011 °ExitCare® Patient Information ©2014 [employer]. ° °

## 2014-01-18 NOTE — ED Provider Notes (Signed)
CSN: [id number]     Arrival date & time 01/18/14  1617 History  This chart was scribed for non-physician practitioner Cherrie Distance working with Richardean Canal, MD by Elveria Rising, ED Scribe. This patient was seen in room TR10C/TR10C and the patient's care was started at 4:39 PM.   Chief Complaint  Patient presents with  . Wound Check      The history is provided by the patient. No language interpreter was used.   HPI Comments: Samuel Rowland is a 53 y.o. male who presents to the Emergency Department requesting recheck of an abscess to left suprapubic groin that was incised and drained three days ago, 01/15/2014. Patient is here to have his packing removed. Patient reports continued drainage from abscess.   Past Medical History  Diagnosis Date  . Renal disorder   . Kidney transplant as cause of abnormal reaction or later complication   . Hypertension    Past Surgical History  Procedure Laterality Date  . Nephrectomy transplanted organ    . Back surgery      C5-6 anterior spinal fusion   History reviewed. No pertinent family history. History  Substance Use Topics  . Smoking status: Never Smoker   . Smokeless tobacco: Not on file  . Alcohol Use: No    Review of Systems  Skin: Positive for wound.       Abscess to left groin.   All other systems reviewed and are negative.      Allergies  Review of patient's allergies indicates no known allergies.  Home Medications   Current Outpatient Rx  Name  Route  Sig  Dispense  Refill  . doxycycline (VIBRAMYCIN) 100 MG capsule   Oral   Take 1 capsule (100 mg total) by mouth 2 (two) times daily.   20 capsule   0   . esomeprazole (NEXIUM) 40 MG capsule   Oral   Take 40 mg by mouth daily as needed (for acid reflux).          Marland Kitchen lisinopril (PRINIVIL,ZESTRIL) 5 MG tablet   Oral   Take 2.5 mg by mouth 2 (two) times daily.          . mycophenolate (MYFORTIC) 180 MG EC tablet   Oral   Take 720 mg by mouth 2 (two) times  daily.         Marland Kitchen oxyCODONE-acetaminophen (PERCOCET/ROXICET) 5-325 MG per tablet   Oral   Take 1 tablet by mouth every 6 (six) hours as needed for severe pain.   15 tablet   0   . predniSONE (DELTASONE) 5 MG tablet   Oral   Take 5 mg by mouth daily with breakfast.         . tacrolimus (PROGRAF) 1 MG capsule   Oral   Take 4 mg by mouth 2 (two) times daily.          BP 151/96  Pulse 88  Temp(Src) 97.6 F (36.4 C) (Oral)  Resp 16  Wt 197 lb 6.4 oz (89.54 kg)  SpO2 100% Physical Exam  Nursing note and vitals reviewed. Constitutional: He is oriented to person, place, and time. He appears well-developed and well-nourished. No distress.  HENT:  Head: Normocephalic and atraumatic.  Mouth/Throat: Oropharynx is clear and moist.  Eyes: Conjunctivae are normal. No scleral icterus.  Pulmonary/Chest: Effort normal.  Musculoskeletal: Normal range of motion. He exhibits no edema and no tenderness.  Neurological: He is alert and oriented to person, place, and time. He exhibits  normal muscle tone. Coordination normal.  Skin: Skin is warm and dry. No rash noted. There is erythema. No pallor.  Abscess to left suprapubic area with packing in place and purulent drainage noted.  Psychiatric: He has a normal mood and affect. His behavior is normal. Judgment and thought content normal.    ED Course  Procedures (including critical care time) DIAGNOSTIC STUDIES: Oxygen Saturation is 100% on room air, normal by my interpretation.    COORDINATION OF CARE: 4:44 PM- Pt advised of plan for treatment and pt agrees.  4:55 PM- Abscess repacked.      Labs Review Labs Reviewed - No data to display Imaging Review No results found.  INCISION AND DRAINAGE Performed by: Cherrie DistanceSANFORD,Kellyann Ordway C. Consent: Verbal consent obtained. Risks and benefits: risks, benefits and alternatives were discussed Type: abscess  Body area: left suprapubic area  Anesthesia: local infiltration  Incision was made  with a scalpel.  Local anesthetic: lidocaine 2% with epinephrine  Anesthetic total: 2 ml  Complexity: complex Blunt dissection to break up loculations  Drainage: purulent  Drainage amount: small  Packing material: 1/4 in iodoform gauze  Patient tolerance: Patient tolerated the procedure well with no immediate complications.     MDM   Abscess  Patient returns for re-check of left suprapubic wound, continues with purulent drainage, packing pulled and replaced, patient tolerated well.  Will return again in 2 days for re-check.  I personally performed the services described in this documentation, which was scribed in my presence. The recorded information has been reviewed and is accurate.   Izola PriceFrances C. Marisue HumbleSanford, PA-C 01/18/14 1714

## 2014-01-18 NOTE — ED Notes (Signed)
Pt was here on 2/25 and had abscess left groin I&D, here to have packing removed, no other complaints.

## 2014-01-19 NOTE — ED Provider Notes (Signed)
Medical screening examination/treatment/procedure(s) were performed by non-physician practitioner and as supervising physician I was immediately available for consultation/collaboration.   EKG Interpretation None        Evgenia Merriman H Jaela Yepez, MD 01/19/14 0001 

## 2014-01-20 ENCOUNTER — Emergency Department (HOSPITAL_COMMUNITY)
Admission: EM | Admit: 2014-01-20 | Discharge: 2014-01-20 | Disposition: A | Discharge status: Home / Self Care | Payer: Medicare Other | Attending: Emergency Medicine | Admitting: Emergency Medicine

## 2014-01-20 ENCOUNTER — Encounter (HOSPITAL_COMMUNITY): Payer: Self-pay | Admitting: Emergency Medicine

## 2014-01-20 DIAGNOSIS — Z79899 Other long term (current) drug therapy: Secondary | ICD-10-CM | POA: Insufficient documentation

## 2014-01-20 DIAGNOSIS — I10 Essential (primary) hypertension: Secondary | ICD-10-CM | POA: Insufficient documentation

## 2014-01-20 DIAGNOSIS — Z940 Kidney transplant status: Secondary | ICD-10-CM | POA: Insufficient documentation

## 2014-01-20 DIAGNOSIS — Z5189 Encounter for other specified aftercare: Secondary | ICD-10-CM

## 2014-01-20 DIAGNOSIS — Z87448 Personal history of other diseases of urinary system: Secondary | ICD-10-CM | POA: Insufficient documentation

## 2014-01-20 DIAGNOSIS — IMO0002 Reserved for concepts with insufficient information to code with codable children: Secondary | ICD-10-CM | POA: Insufficient documentation

## 2014-01-20 DIAGNOSIS — Z792 Long term (current) use of antibiotics: Secondary | ICD-10-CM | POA: Insufficient documentation

## 2014-01-20 DIAGNOSIS — Z4801 Encounter for change or removal of surgical wound dressing: Secondary | ICD-10-CM | POA: Insufficient documentation

## 2014-01-20 NOTE — Discharge Instructions (Signed)
Call for a follow up appointment with a Family or Primary Care Provider.  Return if Symptoms worsen, you have an increase in pain, or have a fever. Continue taking your doxycyline as prescribed.  Change your dressing at least twice a day or if it gets wet.   Place a warm compress over the area for 10-15 minutes a day 3-4 times a day. Take medication as prescribed.

## 2014-01-20 NOTE — ED Provider Notes (Signed)
CSN: [id number]     Arrival date & time 01/20/14  1419 History  This chart was scribed for non-physician practitioner, Mellody Drown, PA-C,working with Laray Anger, DO, by Karle Plumber, ED Scribe.  This patient was seen in room TR11C/TR11C and the patient's care was started at 4:05 PM.  Chief Complaint  Patient presents with  . Follow-up   The history is provided by the patient and medical records. No language interpreter was used.   HPI Comments:  Samuel Rowland is a 53 y.o. male who presents to the Emergency Department needing a wound check from a previous I&D of an abscess to the left groin that was done approximately 5 days ago. Pt states he has been having  associated drainage of the sight. He presented 3 days ago to have it rechecked, but had it another I&D to the same area and was told to follow up again today. He states he has not changed the dressing since it was applied here three days prior. Pt states he has been taking his antibiotics as directed. He reports taking a Percocet this morning with moderate relief. He denies fever or current pain.   Past Medical History  Diagnosis Date  . Renal disorder   . Kidney transplant as cause of abnormal reaction or later complication   . Hypertension    Past Surgical History  Procedure Laterality Date  . Nephrectomy transplanted organ    . Back surgery      C5-6 anterior spinal fusion   History reviewed. No pertinent family history. History  Substance Use Topics  . Smoking status: Never Smoker   . Smokeless tobacco: Not on file  . Alcohol Use: No    Review of Systems  Constitutional: Negative for fever and chills.  Gastrointestinal: Negative for nausea, vomiting and abdominal pain.  Skin: Positive for wound. Negative for rash.  All other systems reviewed and are negative.   Allergies  Review of patient's allergies indicates no known allergies.  Home Medications   Current Outpatient Rx  Name  Route  Sig  Dispense   Refill  . doxycycline (VIBRAMYCIN) 100 MG capsule   Oral   Take 100 mg by mouth 2 (two) times daily. Started medication on 01-16-14         . esomeprazole (NEXIUM) 40 MG capsule   Oral   Take 40 mg by mouth daily as needed (for acid reflux).          Marland Kitchen lisinopril (PRINIVIL,ZESTRIL) 5 MG tablet   Oral   Take 2.5 mg by mouth 2 (two) times daily.          . mycophenolate (MYFORTIC) 180 MG EC tablet   Oral   Take 720 mg by mouth 2 (two) times daily.         Marland Kitchen oxyCODONE-acetaminophen (PERCOCET/ROXICET) 5-325 MG per tablet   Oral   Take 1 tablet by mouth every 6 (six) hours as needed for severe pain.   15 tablet   0   . predniSONE (DELTASONE) 5 MG tablet   Oral   Take 5 mg by mouth daily with breakfast.         . tacrolimus (PROGRAF) 1 MG capsule   Oral   Take 4 mg by mouth 2 (two) times daily.          Triage Vitals: BP 153/85  Pulse 86  Temp(Src) 97.9 F (36.6 C) (Oral)  Resp 18  Ht 5\' 10"  (1.778 m)  Wt 197 lb 8  oz (89.585 kg)  BMI 28.34 kg/m2  SpO2 100% Physical Exam  Nursing note and vitals reviewed. Constitutional: He is oriented to person, place, and time. He appears well-developed and well-nourished. No distress.  HENT:  Head: Normocephalic and atraumatic.  Eyes: EOM are normal.  Neck: Normal range of motion.  Cardiovascular: Normal rate.   Pulmonary/Chest: Effort normal. No respiratory distress.  Abdominal: Soft. Normal appearance. There is no tenderness. There is no rigidity, no rebound and no guarding.  Genitourinary:     Musculoskeletal: Normal range of motion.  Neurological: He is alert and oriented to person, place, and time.  Skin: Skin is warm and dry.  Open linear wound with iodoform in place, Packing removed. Purulent drainage noted to the packing and overlying gauze. There is a 7x4 cm area of induration to left groin, tracking into the left inguinal region.   Psychiatric: He has a normal mood and affect. His behavior is normal.     ED Course  Procedures (including critical care time) COORDINATION OF CARE: 4:12 PM- Advised pt to keep area covered loosely secondary to drainage and use warm compresses to promote further drainage. Advised to take Ibuprofen for pain and inflammation. Offered pt further pain medication, but declined stating he had enough. Pt verbalizes understanding and agrees to plan.  Labs Review Labs Reviewed - No data to display Imaging Review No results found.   EKG Interpretation None      MDM   Final diagnoses:  Visit for wound check   Pt returns for a wound check on an abscess that required two I &D's.  Pt remains afebrile and compliant with doxycycline.  There is a 7x4 area of induration, non-tender.  The iodoform was removed.  Granulose tissue to surrounding area. Afebrile.  Will have the patient continue to take doxycycline as prescribed. Discussed treatment plan with the patient. Return precautions given. Reports understanding and no other concerns at this time.  Patient is stable for discharge at this time.  I personally performed the services described in this documentation, which was scribed in my presence. The recorded information has been reviewed and is accurate.    Clabe SealLauren M Taronda Comacho, PA-C 01/23/14 1104

## 2014-01-20 NOTE — ED Notes (Signed)
Pt here for follow up of I & D.

## 2014-01-23 NOTE — ED Provider Notes (Signed)
Medical screening examination/treatment/procedure(s) were performed by non-physician practitioner and as supervising physician I was immediately available for consultation/collaboration.   EKG Interpretation None        Laray AngerKathleen M Berkley Cronkright, DO 01/23/14 1407

## 2014-05-28 ENCOUNTER — Other Ambulatory Visit: Payer: Self-pay

## 2014-05-28 ENCOUNTER — Other Ambulatory Visit: Payer: Self-pay | Admitting: Nephrology

## 2014-05-28 DIAGNOSIS — R634 Abnormal weight loss: Secondary | ICD-10-CM

## 2014-06-11 ENCOUNTER — Ambulatory Visit
Admission: RE | Admit: 2014-06-11 | Discharge: 2014-06-11 | Disposition: A | Discharge status: Home / Self Care | Payer: Medicare Other | Source: Ambulatory Visit | Attending: Nephrology | Admitting: Nephrology

## 2014-06-11 ENCOUNTER — Other Ambulatory Visit: Payer: Self-pay | Admitting: Nephrology

## 2014-06-11 DIAGNOSIS — R634 Abnormal weight loss: Secondary | ICD-10-CM

## 2014-07-05 ENCOUNTER — Emergency Department (HOSPITAL_COMMUNITY): Payer: Medicare Other

## 2014-07-05 ENCOUNTER — Emergency Department (HOSPITAL_COMMUNITY)
Admission: EM | Admit: 2014-07-05 | Discharge: 2014-07-06 | Disposition: A | Discharge status: Home / Self Care | Payer: Medicare Other | Attending: Emergency Medicine | Admitting: Emergency Medicine

## 2014-07-05 ENCOUNTER — Encounter (HOSPITAL_COMMUNITY): Payer: Self-pay | Admitting: Emergency Medicine

## 2014-07-05 DIAGNOSIS — I10 Essential (primary) hypertension: Secondary | ICD-10-CM | POA: Diagnosis not present

## 2014-07-05 DIAGNOSIS — Z79899 Other long term (current) drug therapy: Secondary | ICD-10-CM | POA: Diagnosis not present

## 2014-07-05 DIAGNOSIS — Z940 Kidney transplant status: Secondary | ICD-10-CM | POA: Diagnosis not present

## 2014-07-05 DIAGNOSIS — Z87448 Personal history of other diseases of urinary system: Secondary | ICD-10-CM | POA: Diagnosis not present

## 2014-07-05 DIAGNOSIS — IMO0002 Reserved for concepts with insufficient information to code with codable children: Secondary | ICD-10-CM | POA: Insufficient documentation

## 2014-07-05 DIAGNOSIS — R109 Unspecified abdominal pain: Secondary | ICD-10-CM | POA: Diagnosis present

## 2014-07-05 DIAGNOSIS — R112 Nausea with vomiting, unspecified: Secondary | ICD-10-CM | POA: Insufficient documentation

## 2014-07-05 HISTORY — DX: Nausea with vomiting, unspecified: R11.2

## 2014-07-05 LAB — CBC WITH DIFFERENTIAL/PLATELET
Basophils Absolute: 0 10*3/uL (ref 0.0–0.1)
Basophils Relative: 0 % (ref 0–1)
EOS PCT: 2 % (ref 0–5)
Eosinophils Absolute: 0.1 10*3/uL (ref 0.0–0.7)
HCT: 34.4 % — ABNORMAL LOW (ref 39.0–52.0)
HEMOGLOBIN: 10.7 g/dL — AB (ref 13.0–17.0)
LYMPHS ABS: 1.1 10*3/uL (ref 0.7–4.0)
Lymphocytes Relative: 18 % (ref 12–46)
MCH: 26.6 pg (ref 26.0–34.0)
MCHC: 31.1 g/dL (ref 30.0–36.0)
MCV: 85.4 fL (ref 78.0–100.0)
MONOS PCT: 14 % — AB (ref 3–12)
Monocytes Absolute: 0.8 10*3/uL (ref 0.1–1.0)
Neutro Abs: 3.8 10*3/uL (ref 1.7–7.7)
Neutrophils Relative %: 66 % (ref 43–77)
Platelets: 240 10*3/uL (ref 150–400)
RBC: 4.03 MIL/uL — AB (ref 4.22–5.81)
RDW: 13.6 % (ref 11.5–15.5)
WBC: 5.8 10*3/uL (ref 4.0–10.5)

## 2014-07-05 LAB — COMPREHENSIVE METABOLIC PANEL
ALT: 9 U/L (ref 0–53)
AST: 12 U/L (ref 0–37)
Albumin: 2.8 g/dL — ABNORMAL LOW (ref 3.5–5.2)
Alkaline Phosphatase: 79 U/L (ref 39–117)
Anion gap: 15 (ref 5–15)
BUN: 27 mg/dL — AB (ref 6–23)
CALCIUM: 9.9 mg/dL (ref 8.4–10.5)
CO2: 22 meq/L (ref 19–32)
Chloride: 103 mEq/L (ref 96–112)
Creatinine, Ser: 3.27 mg/dL — ABNORMAL HIGH (ref 0.50–1.35)
GFR calc Af Amer: 23 mL/min — ABNORMAL LOW (ref >90)
GFR, EST NON AFRICAN AMERICAN: 20 mL/min — AB (ref >90)
GLUCOSE: 103 mg/dL — AB (ref 70–99)
Potassium: 4.3 mEq/L (ref 3.7–5.3)
SODIUM: 140 meq/L (ref 137–147)
Total Bilirubin: 0.5 mg/dL (ref 0.3–1.2)
Total Protein: 6 g/dL (ref 6.0–8.3)

## 2014-07-05 LAB — URINALYSIS, ROUTINE W REFLEX MICROSCOPIC
Bilirubin Urine: NEGATIVE
GLUCOSE, UA: NEGATIVE mg/dL
KETONES UR: NEGATIVE mg/dL
Leukocytes, UA: NEGATIVE
Nitrite: NEGATIVE
Protein, ur: >300 mg/dL — AB
Specific Gravity, Urine: 1.023 (ref 1.005–1.030)
Urobilinogen, UA: 0.2 mg/dL (ref 0.0–1.0)
pH: 6 (ref 5.0–8.0)

## 2014-07-05 LAB — LIPASE, BLOOD: Lipase: 41 U/L (ref 11–59)

## 2014-07-05 LAB — URINE MICROSCOPIC-ADD ON

## 2014-07-05 MED ORDER — SODIUM CHLORIDE 0.9 % IV SOLN
INTRAVENOUS | Status: DC
  Administered 2014-07-05: 21:00:00 via INTRAVENOUS

## 2014-07-05 MED ORDER — FAMOTIDINE IN NACL 20-0.9 MG/50ML-% IV SOLN
20.0000 mg | Freq: Once | INTRAVENOUS | Status: AC
  Administered 2014-07-05: 20 mg via INTRAVENOUS
  Filled 2014-07-05: qty 50

## 2014-07-05 MED ORDER — PROMETHAZINE HCL 25 MG/ML IJ SOLN
12.5000 mg | Freq: Once | INTRAMUSCULAR | Status: AC
  Administered 2014-07-06: 12.5 mg via INTRAVENOUS
  Filled 2014-07-05: qty 1

## 2014-07-05 MED ORDER — ONDANSETRON HCL 4 MG/2ML IJ SOLN
4.0000 mg | INTRAMUSCULAR | Status: AC | PRN
  Administered 2014-07-05 (×2): 4 mg via INTRAVENOUS
  Filled 2014-07-05: qty 2

## 2014-07-05 NOTE — ED Notes (Signed)
The pt is a kidney transplant 6 years.  Fistula lt arm still.  The pt has had 90 days of nv.  No diarrhea. He has a burning in his insides.  He vomited just prior to arrival here

## 2014-07-05 NOTE — ED Provider Notes (Signed)
CSN: [id number]     Arrival date & time 07/05/14  1831 History   First MD Initiated Contact with Patient 07/05/14 2031     Chief Complaint  Patient presents with  . Abdominal Pain      HPI Pt was seen at 2040. Per pt, c/o gradual onset and persistence of multiple intermittent episodes of N/V for the past 3 months. States he has "lost over 30 lbs" due to daily N/V and decreased PO intake. States he was evaluated by his PMD last month for same, dx UTI, rx cipro. Pt states his symptoms did not improve after taking the cipro. Pt has not f/u with his Renal MD for same. Denies abd pain, no CP/SOB, no back pain, no fevers, no black or blood in stools or emesis.     Renal MD: Dr. Leeroy Bock at [facility] Past Medical History  Diagnosis Date  . Renal disorder   . Kidney transplant as cause of abnormal reaction or later complication   . Hypertension   . Nausea and vomiting     chronic   Past Surgical History  Procedure Laterality Date  . Kidney transplant    . Back surgery      C5-6 anterior spinal fusion    History  Substance Use Topics  . Smoking status: Never Smoker   . Smokeless tobacco: Not on file  . Alcohol Use: No    Review of Systems ROS: Statement: All systems negative except as marked or noted in the HPI; Constitutional: Negative for fever and chills. ; ; Eyes: Negative for eye pain, redness and discharge. ; ; ENMT: Negative for ear pain, hoarseness, nasal congestion, sinus pressure and sore throat. ; ; Cardiovascular: Negative for chest pain, palpitations, diaphoresis, dyspnea and peripheral edema. ; ; Respiratory: Negative for cough, wheezing and stridor. ; ; Gastrointestinal: +N/V. Negative for diarrhea, abdominal pain, blood in stool, hematemesis, jaundice and rectal bleeding. . ; ; Genitourinary: Negative for dysuria, flank pain and hematuria. ; ; Musculoskeletal: Negative for back pain and neck pain. Negative for swelling and trauma.; ; Skin: Negative for pruritus, rash,  abrasions, blisters, bruising and skin lesion.; ; Neuro: Negative for headache, lightheadedness and neck stiffness. Negative for weakness, altered level of consciousness , altered mental status, extremity weakness, paresthesias, involuntary movement, seizure and syncope.      Allergies  Review of patient's allergies indicates no known allergies.  Home Medications   Prior to Admission medications   Medication Sig Start Date End Date Taking? Authorizing Provider  aspirin-sod bicarb-citric acid (ALKA-SELTZER) 325 MG TBEF tablet Take 325 mg by mouth daily as needed (for heartburn / indigestion).   Yes Historical Provider, MD  esomeprazole (NEXIUM) 40 MG capsule Take 40 mg by mouth daily as needed (for acid reflux).    Yes Historical Provider, MD  lisinopril (PRINIVIL,ZESTRIL) 5 MG tablet Take 5 mg by mouth 3 (three) times daily.    Yes Historical Provider, MD  mycophenolate (MYFORTIC) 180 MG EC tablet Take 720 mg by mouth 2 (two) times daily.   Yes Historical Provider, MD  predniSONE (DELTASONE) 5 MG tablet Take 5 mg by mouth daily with breakfast.   Yes Historical Provider, MD  tacrolimus (PROGRAF) 1 MG capsule Take 4 mg by mouth 2 (two) times daily.   Yes Historical Provider, MD   BP 160/87  Pulse 84  Temp(Src) 98.2 F (36.8 C)  Resp 18  Wt 171 lb (77.565 kg)  SpO2 100% Physical Exam 2045: Physical examination:  Nursing notes  reviewed; Vital signs and O2 SAT reviewed;  Constitutional: Well developed, Well nourished, Well hydrated, In no acute distress; Head:  Normocephalic, atraumatic; Eyes: EOMI, PERRL, No scleral icterus; ENMT: Mouth and pharynx normal, Mucous membranes moist; Neck: Supple, Full range of motion, No lymphadenopathy; Cardiovascular: Regular rate and rhythm, No gallop; Respiratory: Breath sounds clear & equal bilaterally, No wheezes.  Speaking full sentences with ease, Normal respiratory effort/excursion; Chest: Nontender, Movement normal; Abdomen: Soft, Nontender,  Nondistended, Normal bowel sounds. NT over transplant site.; Genitourinary: No CVA tenderness; Extremities: Pulses normal, No tenderness, No edema, No calf edema or asymmetry.; Neuro: AA&Ox3, Major CN grossly intact.  Speech clear. No gross focal motor or sensory deficits in extremities.; Skin: Color normal, Warm, Dry.     ED Course  Procedures     EKG Interpretation None      MDM  MDM Reviewed: previous chart, nursing note and vitals Reviewed previous: labs Interpretation: labs and x-ray   Results for orders placed during the hospital encounter of 07/05/14  CBC WITH DIFFERENTIAL      Result Value Ref Range   WBC 5.8  4.0 - 10.5 K/uL   RBC 4.03 (*) 4.22 - 5.81 MIL/uL   Hemoglobin 10.7 (*) 13.0 - 17.0 g/dL   HCT 40.9 (*) 81.1 - 91.4 %   MCV 85.4  78.0 - 100.0 fL   MCH 26.6  26.0 - 34.0 pg   MCHC 31.1  30.0 - 36.0 g/dL   RDW 78.2  95.6 - 21.3 %   Platelets 240  150 - 400 K/uL   Neutrophils Relative % 66  43 - 77 %   Neutro Abs 3.8  1.7 - 7.7 K/uL   Lymphocytes Relative 18  12 - 46 %   Lymphs Abs 1.1  0.7 - 4.0 K/uL   Monocytes Relative 14 (*) 3 - 12 %   Monocytes Absolute 0.8  0.1 - 1.0 K/uL   Eosinophils Relative 2  0 - 5 %   Eosinophils Absolute 0.1  0.0 - 0.7 K/uL   Basophils Relative 0  0 - 1 %   Basophils Absolute 0.0  0.0 - 0.1 K/uL  COMPREHENSIVE METABOLIC PANEL      Result Value Ref Range   Sodium 140  137 - 147 mEq/L   Potassium 4.3  3.7 - 5.3 mEq/L   Chloride 103  96 - 112 mEq/L   CO2 22  19 - 32 mEq/L   Glucose, Bld 103 (*) 70 - 99 mg/dL   BUN 27 (*) 6 - 23 mg/dL   Creatinine, Ser 0.86 (*) 0.50 - 1.35 mg/dL   Calcium 9.9  8.4 - 57.8 mg/dL   Total Protein 6.0  6.0 - 8.3 g/dL   Albumin 2.8 (*) 3.5 - 5.2 g/dL   AST 12  0 - 37 U/L   ALT 9  0 - 53 U/L   Alkaline Phosphatase 79  39 - 117 U/L   Total Bilirubin 0.5  0.3 - 1.2 mg/dL   GFR calc non Af Amer 20 (*) >90 mL/min   GFR calc Af Amer 23 (*) >90 mL/min   Anion gap 15  5 - 15  LIPASE, BLOOD       Result Value Ref Range   Lipase 41  11 - 59 U/L  URINALYSIS, ROUTINE W REFLEX MICROSCOPIC      Result Value Ref Range   Color, Urine AMBER (*) YELLOW   APPearance CLEAR  CLEAR   Specific Gravity, Urine 1.023  1.005 - 1.030   pH 6.0  5.0 - 8.0   Glucose, UA NEGATIVE  NEGATIVE mg/dL   Hgb urine dipstick MODERATE (*) NEGATIVE   Bilirubin Urine NEGATIVE  NEGATIVE   Ketones, ur NEGATIVE  NEGATIVE mg/dL   Protein, ur >604 (*) NEGATIVE mg/dL   Urobilinogen, UA 0.2  0.0 - 1.0 mg/dL   Nitrite NEGATIVE  NEGATIVE   Leukocytes, UA NEGATIVE  NEGATIVE  URINE MICROSCOPIC-ADD ON      Result Value Ref Range   Squamous Epithelial / LPF RARE  RARE   WBC, UA 0-2  <3 WBC/hpf   RBC / HPF 0-2  <3 RBC/hpf   Casts HYALINE CASTS (*) NEGATIVE   Dg Chest 2 View 07/05/2014   CLINICAL DATA:  Nausea, vomiting.  EXAM: CHEST  2 VIEW  COMPARISON:  12/16/2008  FINDINGS: The heart size and mediastinal contours are within normal limits. Both lungs are clear. The visualized skeletal structures are unremarkable.  IMPRESSION: No active cardiopulmonary disease.   Electronically Signed   By: Charlett Nose M.D.   On: 07/05/2014 22:32   US Abdomen Complete 06/11/2014   CLINICAL DATA:  Weight loss, abdominal pain, history of renal transplantation  EXAM: ULTRASOUND ABDOMEN COMPLETE  COMPARISON:  None.  FINDINGS: Gallbladder:  Gallbladder is visualized and no gallstones are noted. There is no pain over the gallbladder with compression.  Common bile duct:  Diameter: The common bile duct is normal measuring 3.4 mm in diameter.  Liver:  The liver is echogenic consistent with fatty infiltration. Several hyper echogenic foci are noted of approximately 5 mm in diameter most consistent with calcified granulomas.  IVC:  No abnormality visualized.  Pancreas:  The pancreas is largely obscured by bowel gas.  Spleen:  The spleen is normal measuring 7.7 cm sagittally.  Right Kidney:  Length: 5.7 cm. No hydronephrosis is seen. The renal parenchyma is  echogenic consistent with chronic renal medical disease. Two hypoechoic lesions are present consistent with cysts of 2.3 and 1.8 cm respectively.  Left Kidney:  Length: 8.1 cm. The renal parenchyma is echogenic consistent with chronic renal medical disease.  Abdominal aorta:  The abdominal aorta is normal caliber.  Other findings:  The transplanted kidney in the right pelvis measures 12.1 cm sagittally. No hydronephrosis is seen.  IMPRESSION: 1. Echogenic liver parenchyma consistent with fatty infiltration with probable small hepatic granulomas. 2. Echogenic renal parenchyma consistent with chronic renal medical disease. Two right renal cysts are noted. 3. Transplanted kidney in the right pelvis measures 12.1 cm with no evidence of hydronephrosis. 4. The pancreas is not well seen due to overlying bowel gas.   Electronically Signed   By: Dwyane Dee M.D.   On: 06/11/2014 11:04   US Renal Transplant W/doppler 06/11/2014   CLINICAL DATA:  Evaluate renal transplant. History of abdominal pain and weight loss.  EXAM: ULTRASOUND OF RENAL TRANSPLANT WITH DOPPLER  TECHNIQUE: Ultrasound examination of the renal transplant was performed with gray-scale, color and duplex doppler evaluation.  COMPARISON:  10/28/2009  FINDINGS: Transplant kidney location:  Right lower quadrant.  Transplant kidney:  Length: 12.1 cm. Normal in size and parenchymal echogenicity. No evidence of mass or hydronephrosis.  Color flow in the main renal artery:  Present  Color flow in the main renal vein:  Present  Duplex Doppler Evaluation  Resistive index in main renal artery: 0.77  Venous waveform in main renal vein:  Present  Resistive index in upper pole intrarenal artery: 0.7  Resistive index in  lower pole intrarenal artery: 0.73  Bladder: Normal for degree of bladder distention.  Other findings: Normal caliber of the abdominal aorta and proximal common iliac arteries. The right common iliac artery measures up to 1.2 cm. The right common iliac  artery systolic velocity near the anastomosis is 139 cm/sec. The transplant renal artery peak systolic velocity is 163 cm/sec at its origin. The transplant renal artery in the mid segment measures 72 cm/sec. Peak systolic velocity in the distal transplant renal artery measures 100 cm/sec. Normal waveforms within the transplant renal artery. The transplant renal vein is patent.  IMPRESSION: Normal appearance of the renal transplant within the right lower quadrant. No evidence for hydronephrosis.  The transplant renal artery is patent and no evidence to suggest renal artery stenosis.   Electronically Signed   By: Richarda OverlieAdam  Henn M.D.   On: 06/11/2014 11:15   Ct Abdomen Pelvis Wo Contrast 07/05/2014   CLINICAL DATA:  Abdominal pain, vomiting.  EXAM: CT ABDOMEN AND PELVIS WITHOUT CONTRAST  TECHNIQUE: Multidetector CT imaging of the abdomen and pelvis was performed following the standard protocol without IV contrast.  COMPARISON:  Ultrasound 06/11/2014  FINDINGS: Heart is borderline in size. Lung bases are clear. No effusions. Calcified structure in the lower posterior mediastinum, presumably calcified node related to old granulomatous disease.  Calcifications scattered throughout the liver compatible with old granulomatous disease. Gallbladder, spleen, pancreas, adrenals are unremarkable.  Kidneys are severely atrophic with severe diffuse cortical thinning. Small scattered calcified cystic structures within the native kidneys. Right lower quadrant renal transplant noted which is grossly unremarkable.  Stomach, large and small bowel are grossly unremarkable. Urinary bladder grossly unremarkable. Aorta is calcified, non aneurysmal. No free fluid, free air or adenopathy. Prostate is mildly enlarged.  No acute bony abnormality or focal bone lesion.  IMPRESSION: Evidence of old granulomatous disease.  Right lower quadrant renal transplant grossly unremarkable.  No acute findings.   Electronically Signed   By: Charlett NoseKevin  Dover M.D.    On: 07/05/2014 23:44    2350:  BUN/Cr near baseline. Pt has tol PO well while in the ED without vomiting for his near 6 hour ED visit.  No stooling while in the ED.  Abd remains benign, VSS. Feels better and wants to go home now. Pt requesting "something else for nausea before I go" as well as a prescription. Will dose phenergan before d/c. Strongly encouraged to f/u with his Renal MD at [facility]; verb understanding. Dx and testing d/w pt and family.  Questions answered.  Verb understanding, agreeable to d/c home with outpt f/u.        Samuel JesterKathleen Mickle Campton, DO 07/08/14 1745

## 2014-07-05 NOTE — ED Notes (Signed)
Pt returned from ct

## 2014-07-05 NOTE — ED Notes (Signed)
Per pt sts 4 days of abdominal pain, vomiting. sts unable to take meds and pt hx of kidney transplant.

## 2014-07-05 NOTE — ED Notes (Signed)
To ct

## 2014-07-06 DIAGNOSIS — R112 Nausea with vomiting, unspecified: Secondary | ICD-10-CM | POA: Diagnosis not present

## 2014-07-06 MED ORDER — PROMETHAZINE HCL 25 MG PO TABS: 25.0000 mg | ORAL_TABLET | Freq: Four times a day (QID) | ORAL | Status: DC | PRN

## 2014-07-06 NOTE — Discharge Instructions (Signed)
°Emergency Department Resource Guide °1) Find a Doctor and Pay Out of Pocket °Although you won't have to find out who is covered by your insurance plan, it is a good idea to ask around and get recommendations. You will then need to call the office and see if the doctor you have chosen will accept you as a new patient and what types of options they offer for patients who are self-pay. Some doctors offer discounts or will set up payment plans for their patients who do not have insurance, but you will need to ask so you aren't surprised when you get to your appointment. ° °2) Contact Your Local Health Department °Not all health departments have doctors that can see patients for sick visits, but many do, so it is worth a call to see if yours does. If you don't know where your local health department is, you can check in your phone book. The CDC also has a tool to help you locate your state's health department, and many state websites also have listings of all of their local health departments. ° °3) Find a Walk-in Clinic °If your illness is not likely to be very severe or complicated, you may want to try a walk in clinic. These are popping up all over the country in pharmacies, drugstores, and shopping centers. They're usually staffed by nurse practitioners or physician assistants that have been trained to treat common illnesses and complaints. They're usually fairly quick and inexpensive. However, if you have serious medical issues or chronic medical problems, these are probably not your best option. ° °No Primary Care Doctor: °- Call Health Connect at  832-8000 - they can help you locate a primary care doctor that  accepts your insurance, provides certain services, etc. °- Physician Referral Service- 1-800-533-3463 ° °Chronic Pain Problems: °Organization         Address  Phone   Notes  °[facility]  (336) 297-2271 Patients need to be referred by their primary care doctor.  ° °Medication  Assistance: °Organization         Address  Phone   Notes  °[place] [facility] [address] °[place], [place] [address] (336) 641-8030 --Must be a resident of [place] °-- Must have NO insurance coverage whatsoever (no Medicaid/ Medicare, etc.) °-- The pt. MUST have a primary care doctor that directs their care regularly and follows them in the community °  °[facility]  (866) 331-1348   °[employer]  (888) 892-1162   ° °Agencies that provide inexpensive medical care: °Organization         Address  Phone   Notes  °[facility]  (336) 832-8035   °[facility]    (336) 832-7272   °[facility] [address] °[place], [place] [address] (336) 832-4777   °[facility] [address], °[place] (336) 271-4999   °[facility]    (336) 373-0678   °[facility]    (336) 272-1050   °[facility] ° [address], [place] Phone:  (336) 832-4444, Fax:  (336) 832-4440 Hours of Operation:  9 am - 6 pm, M-F.  Also accepts Medicaid/Medicare and self-pay.  °[facility] ° [address], [place] Phone: (336) 832-3150, Fax: (336) [phone]. Hours of Operation:  8:30 am - 5:30 pm, M-F.  Also accepts Medicaid and self-pay.  °[facility] [place] [address], [place] Phone: (336) 878-6027   °[facility] [address], [place], [place] (336)723-1848, Ext. 123 Mondays & Thursdays: 7-9 AM.  First 15 patients are seen on a first come, first serve basis. °  ° °Medicaid-accepting [place] Providers: ° °Organization         Address  Phone   Notes  °[facility] [address], [place] (336) 641-2100 Also accepts self-pay patients.  °[facility] [address], [place] ° (336) 856-9996   °[facility] [address], [place]  (336) 288-8857   °[facility] [address] [place] [address], [place] (336) 299-7000   °Veita Bland [address], [place]  ° (336) 373-1557 Only accepts [place] Access Medicaid patients after they have their name applied to their card.  ° °Self-Pay (no insurance) in [place]: ° °Organization         Address  Phone   Notes  °Sickle Cell Patients, [facility] [address], [place] (336) 832-1970   °[facility] [address], [place] (336) 832-4400   °[facility] ° [address], [place] (336) 992-4800   °[facility]/Dr. Osei-Bonsu ° [address] [place] [address], [place] or [address], [place] (336) 841-8500 Phone number for both [place] and [place] locations is the same.  °[facility] [address], [place] (336) 299-0000   °[facility] [address] [place] [address], [place] or [address] (336) 852-7530 °(336) 878-2260   °[facility] [address], [place] (336) 350-1642, phone; (336) 294-5005, fax Sees patients 1st and 3rd Saturday of every month.  Must not qualify for public or private insurance (i.e. Medicaid, Medicare, [place] Health Choice, Veterans' Benefits) • Household income should be no more than 200% of the poverty level •The clinic cannot treat you if you are pregnant or think you are pregnant • Sexually transmitted diseases are not treated at the clinic.  ° ° °Dental Care: °Organization         Address  Phone  Notes  °[place] [facility] [address], [place] (336) 641-6152 Accepts children up to age 21 who are enrolled in Medicaid or [place] Health Choice; pregnant women with a Medicaid card; and children who have applied for Medicaid or [place] Health Choice, but were declined, whose parents can pay a reduced fee at time of service.  °[place]  [facility] [place]  [address], [place] (336) 641-7733 Accepts children up to age 21 who are enrolled in Medicaid or [place] Health Choice; pregnant women with a Medicaid card; and children who have applied for Medicaid or [place] Health Choice, but were declined, whose parents can pay a reduced fee at time of service.  °[facility] ° [address], [place] (336) 641-4533 Patients are seen by appointment only. Walk-ins are not accepted. [facility] will see patients 18 years of age and older. °Monday - Tuesday (8am-5pm) °Most Wednesdays (8:30-5pm) °$30 per visit, cash only  °[facility] ° [address], [place] (336) 641-4533 Patients are seen by appointment only. Walk-ins are not accepted. [facility] will see patients 18 years of age and older. °One   Wednesday Evening (Monthly: Volunteer Based).  $30 per visit, cash only  °[facility]  (919) 537-3737 for adults; Children under age 4, call Graduate Pediatric Dentistry at (919) [phone]. Children aged 4-14, please call (919) [phone] to request a pediatric application. ° Dental services are provided in all areas of dental care including fillings, crowns and bridges, complete and partial dentures, implants, gum treatment, root canals, and extractions. Preventive care is also provided. Treatment is provided to both adults and children. °Patients are selected via a lottery and there is often a waiting list. °  °[facility] [address], °[place] ° (336) 763-8833 [email or web address] °  °[facility] [address], [place], [place] (336)723-1848, Ext. 123 Second and Fourth Thursday of each month, opens at 6:30 AM; Clinic ends at 9 AM.  Patients are seen on a first-come first-served basis, and a limited number are seen during each clinic.  ° °[facility] ° [address], [place], [place] (336) 723-7904    Eligibility Requirements °You must have lived in [place], [place], or [place] counties for at least the last three months. °  You cannot be eligible for state or federal sponsored healthcare insurance, including Veterans Administration, Medicaid, or Medicare. °  You generally cannot be eligible for healthcare insurance through your employer.  °  How to apply: °Eligibility screenings are held every Tuesday and Wednesday afternoon from 1:00 pm until 4:00 pm. You do not need an appointment for the interview!  °[facility] [address], [place], [place] 336-631-2330   °[facility]  336-342-8273   °[facility]  336-703-3100   °[facility]  336-570-6415   ° °Behavioral Health Resources in the Community: °Intensive Outpatient Programs °Organization         Address  Phone  Notes  °[facility] [address], [place], [place] 336-878-6098   °[facility] [address], [place], [place] 336-832-9800   °[facility] [address], [place], [place] ° 336-882-2125   °[facility] [address],  °[place], [place] 1-800-853-5163 or 336-641-4981   °Substance Abuse Resources °Organization         Address  Phone  Notes  °[facility]  336-882-2125   °[facility]  336-784-9470   °[facility]  336-285-9073   °[facility]  336-845-3988   °Residential & Outpatient Substance Abuse Program  1-800-659-3381   °Psychological Services °Organization         Address  Phone  Notes  °[facility]  336- 832-9600   °[facility]  336- 378-7881   °[facility] [address], [place] 1-800-853-5163 or 336-641-4981   ° °Mobile Crisis Teams °Organization         Address  Phone  Notes  °[facility], Mobile Crisis Care Unit  1-877-626-1772   °Assertive °[facility] ° [address]  [place], [place] 336-834-9664   °Sharon DeEsch [address] °[place] 336-554-5454   ° °Self-Help/Support Groups °Organization         Address  Phone             Notes  °[facility] of  - variety of support groups  336- 373-1402 Call for more information  °Narcotics Anonymous (NA), [facility] [address], °[place]  2 meetings at this location  ° °  Residential Treatment Programs Organization         Address  Phone  Notes  [facility] [address],    [place]  [phone])[phone]   [facility]  [address], [place] [phone], [place], [place] [phone]   [facility] [address], [place] [phone] Admissions: 8am-3pm M-F  [facility] [address]    [place], [place] [phone]   [facility] [address], [place], [place] [phone]   [facility] [address]  [place], [place] [phone]   [facility] Intensive Outpatient [address], [place], [place] [phone]   [facility] ([facility] [address]  [place], [place] [phone] or ([phone]) [phone]   [facility] ([facility]) [address] [place], [place] [phone] Accepts Medicaid  [facility] [address]  [place] [phone] Substance Abuse/Addiction Treatment   [facility] [place] Resources Organization         Address  Phone  Notes  [facility]  [phone](888) [phone]   Angie FavaJulie Brannon, PhD [address], [address] [place], [place]   [phone](336) [phone] or [phone]) [phone]   [facility]   [address] [place], [place] ([phone]) [phone](336) [phone]   [facility] [address], [place], [place] [phone](336) [phone] Insurance/Medicaid/sponsorship through [facility]  [facility] [address]                                    [place], [place] [phone]) [phone] Therapy/tele-psych/case    [facility] [address]   [place], [place] [phone]) [phone]    Dr. Lolly MustacheArfeen  [phone]) [phone]   [facility] [place]  [employer] [facility] 1) [address], [place] 2) [address], [place] 3)  [address], [place] [phone](336) [phone](336) [phone]  [phone](336) [phone]   [facility] Child [facility] [phone](336) 340-012-0032 or [phone](336) ([phone] (After Hours)      Take the prescription as directed.  Increase your fluid intake for the next few days.  Eat a bland diet and advance to your regular diet slowly as you can tolerate it.  Call your Renal doctor on Monday to schedule a follow up appointment this week.  Return to the Emergency Department immediately if not improving (or even worsening) despite taking the medicines as prescribed, any black or bloody stool or vomit, if you develop a fever over "101," or for any other concerns.

## 2014-09-19 ENCOUNTER — Encounter (HOSPITAL_COMMUNITY): Payer: Self-pay | Admitting: Emergency Medicine

## 2014-09-19 ENCOUNTER — Inpatient Hospital Stay (HOSPITAL_COMMUNITY)
Admission: EM | Admit: 2014-09-19 | Discharge: 2014-09-21 | DRG: 727 | Disposition: A | Discharge status: Home / Self Care | Payer: Medicare Other | Attending: Internal Medicine | Admitting: Internal Medicine

## 2014-09-19 DIAGNOSIS — N179 Acute kidney failure, unspecified: Secondary | ICD-10-CM | POA: Diagnosis present

## 2014-09-19 DIAGNOSIS — E872 Acidosis, unspecified: Secondary | ICD-10-CM

## 2014-09-19 DIAGNOSIS — Z981 Arthrodesis status: Secondary | ICD-10-CM

## 2014-09-19 DIAGNOSIS — E43 Unspecified severe protein-calorie malnutrition: Secondary | ICD-10-CM | POA: Diagnosis present

## 2014-09-19 DIAGNOSIS — Z87440 Personal history of urinary (tract) infections: Secondary | ICD-10-CM

## 2014-09-19 DIAGNOSIS — N419 Inflammatory disease of prostate, unspecified: Principal | ICD-10-CM | POA: Diagnosis present

## 2014-09-19 DIAGNOSIS — R110 Nausea: Secondary | ICD-10-CM

## 2014-09-19 DIAGNOSIS — Z79899 Other long term (current) drug therapy: Secondary | ICD-10-CM

## 2014-09-19 DIAGNOSIS — N410 Acute prostatitis: Secondary | ICD-10-CM | POA: Diagnosis present

## 2014-09-19 DIAGNOSIS — N189 Chronic kidney disease, unspecified: Secondary | ICD-10-CM | POA: Diagnosis present

## 2014-09-19 DIAGNOSIS — I10 Essential (primary) hypertension: Secondary | ICD-10-CM | POA: Insufficient documentation

## 2014-09-19 DIAGNOSIS — Z7952 Long term (current) use of systemic steroids: Secondary | ICD-10-CM

## 2014-09-19 DIAGNOSIS — Z940 Kidney transplant status: Secondary | ICD-10-CM

## 2014-09-19 DIAGNOSIS — I129 Hypertensive chronic kidney disease with stage 1 through stage 4 chronic kidney disease, or unspecified chronic kidney disease: Secondary | ICD-10-CM | POA: Diagnosis present

## 2014-09-19 DIAGNOSIS — Z6823 Body mass index (BMI) 23.0-23.9, adult: Secondary | ICD-10-CM

## 2014-09-19 LAB — CBC WITH DIFFERENTIAL/PLATELET
Basophils Absolute: 0 10*3/uL (ref 0.0–0.1)
Basophils Relative: 0 % (ref 0–1)
Eosinophils Absolute: 0.1 10*3/uL (ref 0.0–0.7)
Eosinophils Relative: 1 % (ref 0–5)
HCT: 32.9 % — ABNORMAL LOW (ref 39.0–52.0)
HEMOGLOBIN: 10.3 g/dL — AB (ref 13.0–17.0)
Lymphocytes Relative: 7 % — ABNORMAL LOW (ref 12–46)
Lymphs Abs: 0.6 10*3/uL — ABNORMAL LOW (ref 0.7–4.0)
MCH: 26.7 pg (ref 26.0–34.0)
MCHC: 31.3 g/dL (ref 30.0–36.0)
MCV: 85.2 fL (ref 78.0–100.0)
MONOS PCT: 13 % — AB (ref 3–12)
Monocytes Absolute: 1 10*3/uL (ref 0.1–1.0)
NEUTROS ABS: 6.1 10*3/uL (ref 1.7–7.7)
Neutrophils Relative %: 79 % — ABNORMAL HIGH (ref 43–77)
Platelets: 217 10*3/uL (ref 150–400)
RBC: 3.86 MIL/uL — ABNORMAL LOW (ref 4.22–5.81)
RDW: 15 % (ref 11.5–15.5)
WBC: 7.7 10*3/uL (ref 4.0–10.5)

## 2014-09-19 LAB — URINALYSIS, ROUTINE W REFLEX MICROSCOPIC
Bilirubin Urine: NEGATIVE
Glucose, UA: NEGATIVE mg/dL
Ketones, ur: NEGATIVE mg/dL
Leukocytes, UA: NEGATIVE
Nitrite: NEGATIVE
PH: 6 (ref 5.0–8.0)
Protein, ur: >300 mg/dL — AB
Specific Gravity, Urine: 1.025 (ref 1.005–1.030)
Urobilinogen, UA: 0.2 mg/dL (ref 0.0–1.0)

## 2014-09-19 LAB — COMPREHENSIVE METABOLIC PANEL
ALBUMIN: 1.9 g/dL — AB (ref 3.5–5.2)
ALK PHOS: 90 U/L (ref 39–117)
ALT: 8 U/L (ref 0–53)
AST: 10 U/L (ref 0–37)
Anion gap: 14 (ref 5–15)
BILIRUBIN TOTAL: 0.4 mg/dL (ref 0.3–1.2)
BUN: 30 mg/dL — AB (ref 6–23)
CHLORIDE: 107 meq/L (ref 96–112)
CO2: 19 mEq/L (ref 19–32)
Calcium: 8.7 mg/dL (ref 8.4–10.5)
Creatinine, Ser: 4.03 mg/dL — ABNORMAL HIGH (ref 0.50–1.35)
GFR calc Af Amer: 18 mL/min — ABNORMAL LOW (ref >90)
GFR calc non Af Amer: 16 mL/min — ABNORMAL LOW (ref >90)
Glucose, Bld: 102 mg/dL — ABNORMAL HIGH (ref 70–99)
POTASSIUM: 4.2 meq/L (ref 3.7–5.3)
Sodium: 140 mEq/L (ref 137–147)
Total Protein: 5.3 g/dL — ABNORMAL LOW (ref 6.0–8.3)

## 2014-09-19 LAB — URINE MICROSCOPIC-ADD ON

## 2014-09-19 MED ORDER — SULFAMETHOXAZOLE-TMP DS 800-160 MG PO TABS
1.0000 | ORAL_TABLET | Freq: Every day | ORAL | Status: DC
  Administered 2014-09-20: 1 via ORAL
  Filled 2014-09-19 (×2): qty 1

## 2014-09-19 MED ORDER — SODIUM CHLORIDE 0.9 % IV SOLN
INTRAVENOUS | Status: DC
  Administered 2014-09-20 (×3): via INTRAVENOUS

## 2014-09-19 MED ORDER — SODIUM CHLORIDE 0.9 % IV BOLUS (SEPSIS)
1000.0000 mL | Freq: Once | INTRAVENOUS | Status: AC
  Administered 2014-09-20: 1000 mL via INTRAVENOUS

## 2014-09-19 MED ORDER — TACROLIMUS 1 MG PO CAPS
5.0000 mg | ORAL_CAPSULE | Freq: Two times a day (BID) | ORAL | Status: DC
  Administered 2014-09-20 – 2014-09-21 (×4): 5 mg via ORAL
  Filled 2014-09-19 (×5): qty 5

## 2014-09-19 MED ORDER — HEPARIN SODIUM (PORCINE) 5000 UNIT/ML IJ SOLN
5000.0000 [IU] | Freq: Three times a day (TID) | INTRAMUSCULAR | Status: DC
  Filled 2014-09-19 (×8): qty 1

## 2014-09-19 MED ORDER — ONDANSETRON HCL 4 MG PO TABS: 4.0000 mg | ORAL_TABLET | Freq: Four times a day (QID) | ORAL | Status: DC | PRN

## 2014-09-19 MED ORDER — MORPHINE SULFATE 4 MG/ML IJ SOLN
4.0000 mg | INTRAMUSCULAR | Status: DC | PRN
  Administered 2014-09-20: 2 mg via INTRAVENOUS
  Administered 2014-09-21: 4 mg via INTRAVENOUS
  Filled 2014-09-19 (×2): qty 1

## 2014-09-19 MED ORDER — CIPROFLOXACIN HCL 500 MG PO TABS: 500.0000 mg | ORAL_TABLET | Freq: Every day | ORAL | Status: DC

## 2014-09-19 MED ORDER — CINACALCET HCL 30 MG PO TABS
30.0000 mg | ORAL_TABLET | Freq: Every day | ORAL | Status: DC
  Administered 2014-09-20 – 2014-09-21 (×2): 30 mg via ORAL
  Filled 2014-09-19 (×3): qty 1

## 2014-09-19 MED ORDER — PREDNISONE 5 MG PO TABS
5.0000 mg | ORAL_TABLET | Freq: Every day | ORAL | Status: DC
  Administered 2014-09-20 – 2014-09-21 (×2): 5 mg via ORAL
  Filled 2014-09-19 (×3): qty 1

## 2014-09-19 MED ORDER — SULFAMETHOXAZOLE-TMP DS 800-160 MG PO TABS: 1.0000 | ORAL_TABLET | Freq: Two times a day (BID) | ORAL | Status: DC

## 2014-09-19 MED ORDER — ALUM & MAG HYDROXIDE-SIMETH 200-200-20 MG/5ML PO SUSP: 30.0000 mL | Freq: Four times a day (QID) | ORAL | Status: DC | PRN

## 2014-09-19 MED ORDER — ONDANSETRON HCL 4 MG/2ML IJ SOLN
4.0000 mg | Freq: Four times a day (QID) | INTRAMUSCULAR | Status: DC | PRN
  Filled 2014-09-19: qty 2

## 2014-09-19 MED ORDER — ONDANSETRON HCL 4 MG/2ML IJ SOLN
4.0000 mg | Freq: Once | INTRAMUSCULAR | Status: AC
  Administered 2014-09-19: 4 mg via INTRAVENOUS
  Filled 2014-09-19: qty 2

## 2014-09-19 MED ORDER — MYCOPHENOLATE SODIUM 180 MG PO TBEC
720.0000 mg | DELAYED_RELEASE_TABLET | Freq: Two times a day (BID) | ORAL | Status: DC
  Administered 2014-09-20 – 2014-09-21 (×4): 720 mg via ORAL
  Filled 2014-09-19 (×5): qty 4

## 2014-09-19 MED ORDER — ACETAMINOPHEN 500 MG PO TABS
1000.0000 mg | ORAL_TABLET | Freq: Once | ORAL | Status: AC
  Administered 2014-09-19: 1000 mg via ORAL
  Filled 2014-09-19: qty 2

## 2014-09-19 MED ORDER — MORPHINE SULFATE 4 MG/ML IJ SOLN
4.0000 mg | Freq: Once | INTRAMUSCULAR | Status: AC
  Administered 2014-09-19: 4 mg via INTRAVENOUS
  Filled 2014-09-19: qty 1

## 2014-09-19 NOTE — ED Notes (Signed)
Pt sts since yesterday he has been having difficulty voiding. sts very painful and hard to start urinating.

## 2014-09-19 NOTE — ED Provider Notes (Signed)
MSE was initiated and I personally evaluated the patient and placed orders (if any) at  6:55 PM on September 19, 2014.  The patient appears stable so that the remainder of the MSE may be completed by another provider.  Patient taken in sign out from PA Konrad DoloresMerrell This is a patient who has a history of renal transplant. He had a [facility] was performed 10 years ago. Patient had a biopsy because his creatinine and protein levels have elevated significantly. Patient states that his creatinine was around 2 for a long period of time however recently jumped up to 3 and today is found to be at 4.03 today. Patient states that he was having symptoms of nausea and chills prior to the biopsy, was found to have a urinary tract infection and was treated with 10 days of Cipro. Patient states that the reason he came in today was because his symptoms worsened and he is having burning pain and difficulty with urination. He has associated myalgias, chills, loss of appetite. He denies fever at home. Prostate examination was performed by Dr. Effie ShyWENTZ. And was found to be swollen, boggy and exquisitely tender to palpation. Patient[place] in [place]. He has no local follow-up and lives here. Considering the patient's immunosuppression secondary to antirejection medications, elevating creatinine, UTI which has failed outpatient antibiotics and possible prostatitis I feel admission is warranted. I discussed this with the patient who agrees to admission. I discussed the case with Dr. Elesa MassedWard who is also in agreement.  Arthor CaptainAbigail Wyolene Weimann, PA-C 09/20/14 [phone]

## 2014-09-19 NOTE — H&P (Addendum)
[facility] History and Physical  Samuel Rowland ZOX:[id number] DOB: 04/10/1961 DOA: 09/19/2014  Referring physician: Cammy CopaAbigail PA - [facility] PCP: No PCP Per Patient   Chief Complaint: nausea, lower abd pain and dysuria  HPI: Samuel PennaClayton Rowland is a 53 y.o. male  W/ h/o renal transplant in 2010 at [facility]. Symptoms of nausea, lower abd pain, started a few weeks ago. dysuyria started a24-48 hrs ago.  Not getting better or worse. Increased prograf dose a couple months ago (direct increase in nausea during this time). [facility], but hastn' heard about the results. Has had these symptoms in the past and fluids and phenergan helps. Given zofran adn morphine in ED w/ some improvement.   Dysuria is only at the beginning of urination. Denies frequency. Difficulty initiating stream. Always able to fully empty bladder. Last treated for UTI w/ cipro 3-4 weeks ago.   H/o hypomagnesemia   Review of Systems:  Constitutional:  No weight loss, night sweats, Fevers, chills, fatigue.  HEENT:  No headaches, Difficulty swallowing,Tooth/dental problems,Sore throat,  No sneezing, itching, ear ache, nasal congestion, post nasal drip,  Cardio-vascular:  No chest pain, Orthopnea, PND, swelling in lower extremities, anasarca, dizziness, palpitations  GI:   abdominal pain, nausea, . No change in bowel habits, loss of appetite  Resp:  No shortness of breath with exertion or at rest. No excess mucus, no productive cough, No non-productive cough, No coughing up of blood.No change in color of mucus.No wheezing.No chest wall deformity  Skin:  no rash or lesions.  GU:  Per HPI Musculoskeletal:  No joint pain or swelling. No decreased range of motion. No back pain.  Psych:  No change in mood or affect. No depression or anxiety. No memory loss.   Past Medical History  Diagnosis Date  . Renal disorder   . Kidney transplant as cause of abnormal reaction or later complication   .  Hypertension   . Nausea and vomiting     chronic   Past Surgical History  Procedure Laterality Date  . Kidney transplant    . Back surgery      C5-6 anterior spinal fusion   Social History:  reports that he has never smoked. He does not have any smokeless tobacco history on file. He reports that he does not drink alcohol or use illicit drugs.  No Known Allergies  History reviewed. No pertinent family history.   Prior to Admission medications   Medication Sig Start Date End Date Taking? Authorizing Provider  cinacalcet (SENSIPAR) 30 MG tablet Take 30 mg by mouth daily. 08/26/14  Yes Historical Provider, MD  lisinopril (PRINIVIL,ZESTRIL) 5 MG tablet Take 5 mg by mouth 3 (three) times daily.    Yes Historical Provider, MD  mycophenolate (MYFORTIC) 180 MG EC tablet Take 720 mg by mouth 2 (two) times daily.   Yes Historical Provider, MD  predniSONE (DELTASONE) 5 MG tablet Take 5 mg by mouth daily with breakfast.   Yes Historical Provider, MD  ranitidine (ZANTAC) 150 MG tablet Take 150 mg by mouth daily as needed for heartburn.   Yes Historical Provider, MD  tacrolimus (PROGRAF) 1 MG capsule Take 5 mg by mouth 2 (two) times daily.    Yes Historical Provider, MD   Physical Exam: Filed Vitals:   09/19/14 1900 09/19/14 2000 09/19/14 2030 09/19/14 2100  BP: 172/96 160/86 163/78 162/80  Pulse: 86 84 84 78  Temp:      TempSrc:  Resp:      SpO2: 98% 99% 97% 98%    Wt Readings from Last 3 Encounters:  07/05/14 77.565 kg (171 lb)  01/20/14 89.585 kg (197 lb 8 oz)  01/18/14 89.54 kg (197 lb 6.4 oz)    General: Appears calm and comfortable Eyes: PERRL, normal lids, irises & conjunctiva ENT: dry mucus membranes.  grossly normal hearing, lips & tongue Neck: no LAD, masses or thyromegaly Cardiovascular: RRR, III/VI blowing systolic murmur. No LE edema. Telemetry: SR, no arrhythmias  Respiratory: CTA bilaterally, no w/r/r. Normal respiratory effort. Abdomen:  soft, ntnd GU: performed  by ED physician and noted to be large tenderna dn boggy.  Skin:  no rash or induration seen on limited exam Musculoskeletal:  grossly normal tone BUE/BLE Psychiatric:  grossly normal mood and affect, speech fluent and appropriate Neurologic:  grossly non-focal.          Labs on Admission:  Basic Metabolic Panel:  Recent Labs Lab 09/19/14 1405  NA 140  K 4.2  CL 107  CO2 19  GLUCOSE 102*  BUN 30*  CREATININE 4.03*  CALCIUM 8.7   Liver Function Tests:  Recent Labs Lab 09/19/14 1405  AST 10  ALT 8  ALKPHOS 90  BILITOT 0.4  PROT 5.3*  ALBUMIN 1.9*   No results found for this basename: LIPASE, AMYLASE,  in the last 168 hours No results found for this basename: AMMONIA,  in the last 168 hours CBC:  Recent Labs Lab 09/19/14 1405  WBC 7.7  NEUTROABS 6.1  HGB 10.3*  HCT 32.9*  MCV 85.2  PLT 217   Cardiac Enzymes: No results found for this basename: CKTOTAL, CKMB, CKMBINDEX, TROPONINI,  in the last 168 hours  BNP (last 3 results) No results found for this basename: PROBNP,  in the last 8760 hours CBG: No results found for this basename: GLUCAP,  in the last 168 hours  Radiological Exams on Admission: No results found.  EKG: Independently reviewed. Not done in ED  Assessment/Plan Active Problems:   Prostatitis  Acute on Chronic Kidney disease in Transplant patient Low protein Abdominal pain and nausea  Abd pain and nausea: etiology unclear but likely multifactorial w/ primary cause from prograf or other longterm medications started shortly before symptoms. May be compounded by dehydration and possible prostatitis. Much improved per pt after oral hydration, zofran, and morphine in the ED. No acute abdomen on exam. No imaging today.  - Admit obs - IVF NS 15894ml/hr - Zofran PRN - Pt needs outpt f/u w/ [facility] physicians regarding medical regimen and side effects  AoCKD: pt is s/p transplant. Baseline Cr 3.2. Cr 4.03 on admission. Concern for rejection vs  dehydration. Per pt there has been a gradual climb since transplant. On lisinopril - 1L NS bolus - IVF NS 14394m/hr - BMET in am - f/u [facility] renal - hold lisinopril - Continue home prednisone, prograf, myfortic, sensipar  Prostatitis: will need 6 wks of therapy. This is likely the cause of his described dysuria and hesitancy. Dysuria.  - Bactrim Qday (CrCl 23). Discussed possible cipro prograf interaction.  HTN: elevated on admission. Taking lisinopril - hold lisinopril due to AoCKD - hydralazine prn   Code Status: FULL DVT Prophylaxis: Hep [place] TID Family Communication: none Disposition Plan: pending improvement - likely obs  Time spent: >70 min in direct pt care and coordination  Tatem Holsonback J [facility] [email or web address] Password [email or web address] h&

## 2014-09-19 NOTE — ED Provider Notes (Signed)
CSN: [id number]     Arrival date & time 09/19/14  1326 History   First MD Initiated Contact with Patient 09/19/14 1344     Chief Complaint  Patient presents with  . Urinary Frequency  . Dysuria     (Consider location/radiation/quality/duration/timing/severity/associated sxs/prior Treatment) HPI Comments: Patient is a 53 year old male with history of kidney transplant and hypertension who presents the emergency department today for evaluation of dysuria. He reports that yesterday he began having pain with urination. He states it is difficult to start urinating. He had a kidney transplant done in 2010. [place]. He recently had a kidney biopsy 2 weeks ago because he has been having gradually worsening creatinine. His family member reports that she believes he has a fever, but this was not measured. He has associated nausea without vomiting. He recently took Sudafed for a sinus headache which he feels as though it exacerbated his symptoms.   Patient is a 53 y.o. male presenting with frequency and dysuria. The history is provided by the patient. No language interpreter was used.  Urinary Frequency Associated symptoms include a fever (subjective) and nausea. Pertinent negatives include no abdominal pain, chest pain, chills or vomiting.  Dysuria Associated symptoms include a fever (subjective) and nausea. Pertinent negatives include no abdominal pain, chest pain, chills or vomiting.    Past Medical History  Diagnosis Date  . Renal disorder   . Kidney transplant as cause of abnormal reaction or later complication   . Hypertension   . Nausea and vomiting     chronic   Past Surgical History  Procedure Laterality Date  . Kidney transplant    . Back surgery      C5-6 anterior spinal fusion   History reviewed. No pertinent family history. History  Substance Use Topics  . Smoking status: Never Smoker   . Smokeless tobacco: Not on file  . Alcohol Use: No     Review of Systems  Constitutional: Positive for fever (subjective). Negative for chills.  Respiratory: Negative for shortness of breath.   Cardiovascular: Negative for chest pain.  Gastrointestinal: Positive for nausea. Negative for vomiting and abdominal pain.  Genitourinary: Positive for dysuria, frequency and difficulty urinating.  All other systems reviewed and are negative.     Allergies  Review of patient's allergies indicates no known allergies.  Home Medications   Prior to Admission medications   Medication Sig Start Date End Date Taking? Authorizing Provider  cinacalcet (SENSIPAR) 30 MG tablet Take 30 mg by mouth daily. 08/26/14  Yes Historical Provider, MD  aspirin-sod bicarb-citric acid (ALKA-SELTZER) 325 MG TBEF tablet Take 325 mg by mouth daily as needed (for heartburn / indigestion).    Historical Provider, MD  esomeprazole (NEXIUM) 40 MG capsule Take 40 mg by mouth daily as needed (for acid reflux).     Historical Provider, MD  lisinopril (PRINIVIL,ZESTRIL) 5 MG tablet Take 5 mg by mouth 3 (three) times daily.     Historical Provider, MD  mycophenolate (MYFORTIC) 180 MG EC tablet Take 720 mg by mouth 2 (two) times daily.    Historical Provider, MD  predniSONE (DELTASONE) 5 MG tablet Take 5 mg by mouth daily with breakfast.    Historical Provider, MD  promethazine (PHENERGAN) 25 MG tablet Take 1 tablet (25 mg total) by mouth every 6 (six) hours as needed for nausea or vomiting. 07/06/14   Samuel JesterKathleen McManus, DO  tacrolimus (PROGRAF) 1 MG capsule Take 4 mg by mouth 2 (two) times  daily.    Historical Provider, MD   BP 160/87  Pulse 90  Temp(Src) 97.8 F (36.6 C) (Oral)  Resp 18  SpO2 100% Physical Exam  Nursing note and vitals reviewed. Constitutional: He is oriented to person, place, and time. He appears well-developed and well-nourished. No distress.  HENT:  Head: Normocephalic and atraumatic.  Right Ear: External ear normal.  Left Ear: External ear normal.   Nose: Nose normal.  Eyes: Conjunctivae are normal.  Neck: Normal range of motion. No tracheal deviation present.  Cardiovascular: Normal rate, regular rhythm and normal heart sounds.   Pulmonary/Chest: Effort normal and breath sounds normal. No stridor.  Abdominal: Soft. He exhibits no distension. There is no tenderness. There is no rigidity and no guarding.  Musculoskeletal: Normal range of motion.  Neurological: He is alert and oriented to person, place, and time.  Skin: Skin is warm and dry. He is not diaphoretic.  Psychiatric: He has a normal mood and affect. His behavior is normal.    ED Course  Procedures (including critical care time) Labs Review Labs Reviewed  CBC WITH DIFFERENTIAL - Abnormal; Notable for the following:    RBC 3.86 (*)    Hemoglobin 10.3 (*)    HCT 32.9 (*)    Neutrophils Relative % 79 (*)    Lymphocytes Relative 7 (*)    Lymphs Abs 0.6 (*)    Monocytes Relative 13 (*)    All other components within normal limits  COMPREHENSIVE METABOLIC PANEL - Abnormal; Notable for the following:    Glucose, Bld 102 (*)    BUN 30 (*)    Creatinine, Ser 4.03 (*)    Total Protein 5.3 (*)    Albumin 1.9 (*)    GFR calc non Af Amer 16 (*)    GFR calc Af Amer 18 (*)    All other components within normal limits  URINE CULTURE  URINALYSIS, ROUTINE W REFLEX MICROSCOPIC    Imaging Review No results found.   EKG Interpretation None      MDM   Final diagnoses:  None    Patient present to emergency department for evaluation of dysuria, difficulty urinating, and nausea. Patient was enlarged, tender prostate. Patient likely with prostatitis. Urine is pending. Patient signed out to RosaliaHarris, PA-C at change of shift. Vital signs stable at this time.     Mora BellmanHannah S Eleny Cortez, PA-C 09/19/14 [phone]

## 2014-09-19 NOTE — ED Notes (Signed)
Pt also states the left side of his face is hurting. No facial drop or obvious injury noted.

## 2014-09-19 NOTE — ED Provider Notes (Signed)
  Face-to-face evaluation   History: He complains of a sensation of suprapubic discomfort, pain, each time he voids for the last day. He has mild associated dysuria without hematuria. He denies fever, chills, nausea, vomiting. He had a renal biopsy 2 weeks ago to follow-up on abnormal protein and creatinine in his urine. His last UTI was 2 months ago.  Physical exam: Alert, calm, cooperative.  Her abdomen is nontender. Minimal suprapubic tenderness without palpable mass. Rectal examination normal anal tone, enlarged prostate, which is tender, bilaterally. No prostate nodules.  Medical screening examination/treatment/procedure(s) were conducted as a shared visit with non-physician practitioner(s) and myself.  I personally evaluated the patient during the encounter  Flint MelterElliott L Laquonda Welby, MD 09/21/14 1454

## 2014-09-20 DIAGNOSIS — E43 Unspecified severe protein-calorie malnutrition: Secondary | ICD-10-CM | POA: Insufficient documentation

## 2014-09-20 DIAGNOSIS — Z6823 Body mass index (BMI) 23.0-23.9, adult: Secondary | ICD-10-CM | POA: Diagnosis not present

## 2014-09-20 DIAGNOSIS — Z87440 Personal history of urinary (tract) infections: Secondary | ICD-10-CM | POA: Diagnosis not present

## 2014-09-20 DIAGNOSIS — N179 Acute kidney failure, unspecified: Secondary | ICD-10-CM | POA: Diagnosis present

## 2014-09-20 DIAGNOSIS — N419 Inflammatory disease of prostate, unspecified: Secondary | ICD-10-CM | POA: Diagnosis present

## 2014-09-20 DIAGNOSIS — I129 Hypertensive chronic kidney disease with stage 1 through stage 4 chronic kidney disease, or unspecified chronic kidney disease: Secondary | ICD-10-CM | POA: Diagnosis present

## 2014-09-20 DIAGNOSIS — Z981 Arthrodesis status: Secondary | ICD-10-CM | POA: Diagnosis not present

## 2014-09-20 DIAGNOSIS — Z940 Kidney transplant status: Secondary | ICD-10-CM

## 2014-09-20 DIAGNOSIS — Z7952 Long term (current) use of systemic steroids: Secondary | ICD-10-CM | POA: Diagnosis not present

## 2014-09-20 DIAGNOSIS — Z79899 Other long term (current) drug therapy: Secondary | ICD-10-CM | POA: Diagnosis not present

## 2014-09-20 DIAGNOSIS — N189 Chronic kidney disease, unspecified: Secondary | ICD-10-CM | POA: Diagnosis present

## 2014-09-20 DIAGNOSIS — R110 Nausea: Secondary | ICD-10-CM | POA: Diagnosis present

## 2014-09-20 LAB — CBC
HCT: 27.2 % — ABNORMAL LOW (ref 39.0–52.0)
HEMOGLOBIN: 8.7 g/dL — AB (ref 13.0–17.0)
MCH: 27.3 pg (ref 26.0–34.0)
MCHC: 32 g/dL (ref 30.0–36.0)
MCV: 85.3 fL (ref 78.0–100.0)
Platelets: 170 10*3/uL (ref 150–400)
RBC: 3.19 MIL/uL — AB (ref 4.22–5.81)
RDW: 15.1 % (ref 11.5–15.5)
WBC: 5.4 10*3/uL (ref 4.0–10.5)

## 2014-09-20 LAB — BASIC METABOLIC PANEL
Anion gap: 12 (ref 5–15)
BUN: 29 mg/dL — AB (ref 6–23)
CHLORIDE: 113 meq/L — AB (ref 96–112)
CO2: 16 mEq/L — ABNORMAL LOW (ref 19–32)
Calcium: 7.8 mg/dL — ABNORMAL LOW (ref 8.4–10.5)
Creatinine, Ser: 3.92 mg/dL — ABNORMAL HIGH (ref 0.50–1.35)
GFR, EST AFRICAN AMERICAN: 19 mL/min — AB (ref >90)
GFR, EST NON AFRICAN AMERICAN: 16 mL/min — AB (ref >90)
Glucose, Bld: 79 mg/dL (ref 70–99)
POTASSIUM: 4.2 meq/L (ref 3.7–5.3)
SODIUM: 141 meq/L (ref 137–147)

## 2014-09-20 LAB — URINE CULTURE
Colony Count: NO GROWTH
Culture: NO GROWTH

## 2014-09-20 MED ORDER — ENSURE COMPLETE PO LIQD
237.0000 mL | Freq: Two times a day (BID) | ORAL | Status: DC
  Administered 2014-09-21 (×2): 237 mL via ORAL

## 2014-09-20 MED ORDER — CIPROFLOXACIN HCL 500 MG PO TABS: 500.0000 mg | ORAL_TABLET | Freq: Two times a day (BID) | ORAL | Status: DC

## 2014-09-20 MED ORDER — CIPROFLOXACIN HCL 500 MG PO TABS
500.0000 mg | ORAL_TABLET | Freq: Every day | ORAL | Status: DC
  Administered 2014-09-20 – 2014-09-21 (×2): 500 mg via ORAL
  Filled 2014-09-20 (×2): qty 1

## 2014-09-20 MED ORDER — PROMETHAZINE HCL 25 MG/ML IJ SOLN
25.0000 mg | Freq: Four times a day (QID) | INTRAMUSCULAR | Status: DC | PRN
  Administered 2014-09-20: 25 mg via INTRAVENOUS
  Filled 2014-09-20: qty 1

## 2014-09-20 NOTE — Progress Notes (Signed)
INITIAL NUTRITION ASSESSMENT  DOCUMENTATION CODES Per approved criteria  -Severe malnutrition in the context of chronic illness   Pt meets criteria for severe MALNUTRITION in the context of chronic illness as evidenced by 23% weight loss within the past year and intake </= 75% of estimated energy requirement for >/= 1 month.   INTERVENTION:  Ensure Complete PO BID, each supplement provides 350 kcal and 13 grams of protein  Recommend liberalize diet to regular; patient does not need a renal diet (potassium restriction) as potassium level is WNL.  NUTRITION DIAGNOSIS: Malnutrition related to poor intake as evidenced by 23% weight loss within the past year and intake </= 75% of estimated energy requirement for >/= 1 month.   Goal: Intake to meet >90% of estimated nutrition needs.  Monitor:  PO intake, labs, weight trend.  Reason for Assessment: MST  53 y.o. male  Admitting Dx: Nausea, lower abd pain, dysuria  ASSESSMENT: [age] y.o. male with h/o renal transplant in 2010 at [facility]. Symptoms of nausea, lower abd pain, started a few weeks ago. dysuyria started 24-48 hrs ago.   Unable to complete nutrition focused physical exam at this time.  Patient reports that he has had a poor appetite for a few months. Drinks Ensure at home once a day. Intake since admission has been poor because he does not like the food he's been receiving. Intake PTA was also poor; he's lost 48 lb within the past 8 months.  Height: Ht Readings from Last 1 Encounters:  09/19/14 5\' 10"  (1.778 m)    Weight: Wt Readings from Last 1 Encounters:  09/19/14 162 lb 11.2 oz (73.8 kg)    Ideal Body Weight: 75.5 kg  % Ideal Body Weight: 98%  Wt Readings from Last 10 Encounters:  09/19/14 162 lb 11.2 oz (73.8 kg)  07/05/14 171 lb (77.565 kg)  01/20/14 197 lb 8 oz (89.585 kg)  01/18/14 197 lb 6.4 oz (89.54 kg)  01/15/14 210 lb (95.255 kg)    Usual Body Weight: 210 lb (8 months ago)  % Usual Body Weight:  77%  BMI:  Body mass index is 23.34 kg/(m^2).  Estimated Nutritional Needs: Kcal: 2000-2200 Protein: 90-110 gm Fluid: 2 L  Skin: WDL  Diet Order: Renal  EDUCATION NEEDS: -Education needs addressed   Intake/Output Summary (Last 24 hours) at 09/20/14 1106 Last data filed at 09/20/14 0907  Gross per 24 hour  Intake    240 ml  Output      0 ml  Net    240 ml    Last BM: 10/30   Labs:   Recent Labs Lab 09/19/14 1405 09/20/14 0650  NA 140 141  K 4.2 4.2  CL 107 113*  CO2 19 16*  BUN 30* 29*  CREATININE 4.03* 3.92*  CALCIUM 8.7 7.8*  GLUCOSE 102* 79    CBG (last 3)  No results found for this basename: GLUCAP,  in the last 72 hours  Scheduled Meds: . cinacalcet  30 mg Oral Q breakfast  . ciprofloxacin  500 mg Oral Daily  . heparin  5,000 Units Subcutaneous 3 times per day  . mycophenolate  720 mg Oral BID  . predniSONE  5 mg Oral Q breakfast  . tacrolimus  5 mg Oral BID    Continuous Infusions: . sodium chloride 100 mL/hr at 09/20/14 0007    Past Medical History  Diagnosis Date  . Renal disorder   . Kidney transplant as cause of abnormal reaction or later complication   .  Hypertension   . Nausea and vomiting     chronic    Past Surgical History  Procedure Laterality Date  . Kidney transplant    . Back surgery      C5-6 anterior spinal fusion    Joaquin CourtsKimberly Harris, RD, LDN, CNSC Pager [phone] After Hours Pager [phone]

## 2014-09-20 NOTE — ED Provider Notes (Signed)
Medical screening examination/treatment/procedure(s) were performed by non-physician practitioner and as supervising physician I was immediately available for consultation/collaboration.   EKG Interpretation None        Donnalyn Juran N Greysin Medlen, DO 09/20/14 0111 

## 2014-09-20 NOTE — Progress Notes (Signed)
53 y/o male admitted over night for nausea, lower abdominal pain and dysuria. Found to have a significantly tender prostate on exam and also noted to have acute on chronic CKD and therefore admitted. He was treated for a UTI about 3-4 wks ago.   Principal Problem:   Prostatitis - will need 6 wks of Cipro- cont via IV for now   Active Problems:   Protein-calorie malnutrition, severe - encourage PO intake    AKI (acute kidney injury) - will continue to hydrate - this may be related to outflow obstruction from above in addition to prerenal state - has CKD in a transplanted kidney and underwent a renal biopsy 2 wks ago to ensure there was no rejection- he did not reveal a call back and has presumed it was normal otherwise, he is usually called    Renal transplant recipient/ CKD - cont Prograf, Myfortic, Sensipar and low dose Prednisone   HTN - holding Lisinopril in setting of AKI  Calvert CantorSaima Aarushi Hemric, MD

## 2014-09-21 DIAGNOSIS — N410 Acute prostatitis: Secondary | ICD-10-CM

## 2014-09-21 DIAGNOSIS — E872 Acidosis, unspecified: Secondary | ICD-10-CM

## 2014-09-21 LAB — BASIC METABOLIC PANEL
Anion gap: 15 (ref 5–15)
BUN: 30 mg/dL — ABNORMAL HIGH (ref 6–23)
CHLORIDE: 111 meq/L (ref 96–112)
CO2: 13 meq/L — AB (ref 19–32)
Calcium: 8.3 mg/dL — ABNORMAL LOW (ref 8.4–10.5)
Creatinine, Ser: 4.07 mg/dL — ABNORMAL HIGH (ref 0.50–1.35)
GFR calc Af Amer: 18 mL/min — ABNORMAL LOW (ref >90)
GFR calc non Af Amer: 15 mL/min — ABNORMAL LOW (ref >90)
Glucose, Bld: 85 mg/dL (ref 70–99)
Potassium: 4.6 mEq/L (ref 3.7–5.3)
SODIUM: 139 meq/L (ref 137–147)

## 2014-09-21 LAB — CBC
HEMATOCRIT: 31 % — AB (ref 39.0–52.0)
Hemoglobin: 9.8 g/dL — ABNORMAL LOW (ref 13.0–17.0)
MCH: 26.3 pg (ref 26.0–34.0)
MCHC: 31.6 g/dL (ref 30.0–36.0)
MCV: 83.3 fL (ref 78.0–100.0)
Platelets: 213 10*3/uL (ref 150–400)
RBC: 3.72 MIL/uL — ABNORMAL LOW (ref 4.22–5.81)
RDW: 14.9 % (ref 11.5–15.5)
WBC: 8.1 10*3/uL (ref 4.0–10.5)

## 2014-09-21 MED ORDER — METOPROLOL TARTRATE 25 MG PO TABS
25.0000 mg | ORAL_TABLET | Freq: Two times a day (BID) | ORAL | Status: DC
Start: 2014-09-21 — End: 2015-11-13

## 2014-09-21 MED ORDER — CIPROFLOXACIN HCL 500 MG PO TABS: 500.0000 mg | ORAL_TABLET | Freq: Every day | ORAL | Status: DC

## 2014-09-21 MED ORDER — ACETAMINOPHEN 325 MG PO TABS
650.0000 mg | ORAL_TABLET | Freq: Four times a day (QID) | ORAL | Status: DC | PRN
  Administered 2014-09-21: 650 mg via ORAL
  Filled 2014-09-21: qty 2

## 2014-09-21 MED ORDER — METOPROLOL TARTRATE 25 MG PO TABS
25.0000 mg | ORAL_TABLET | Freq: Two times a day (BID) | ORAL | Status: DC
  Filled 2014-09-21: qty 1

## 2014-09-21 NOTE — Discharge Summary (Addendum)
Physician Discharge Summary  Samuel Rowland WGN:[id number] DOB: 02/22/1961 DOA: 09/19/2014  PCP: No PCP Per Patient  Admit date: 09/19/2014 Discharge date: 09/21/2014  Time spent: >45 minutes  Recommendations for Outpatient Follow-up:  1. oupt f/u with renal transplant team   Discharge Condition: stable Diet recommendation: renal diet  Discharge Diagnoses:  Principal Problem:   Prostatitis, acute Active Problems:   Protein-calorie malnutrition, severe   AKI (acute kidney injury)   Renal transplant recipient   Metabolic acidosis   History of present illness:  Samuel PennaClayton Rowland is a 53 y.o. male with h/o renal transplant in 2010 at [facility]. Symptoms of nausea, lower abd pain, started a few weeks ago. dysuria started a24-48 hrs ago. Increased prograf dose a couple months ago (direct increase in nausea during this time). [facility], but hastn' heard about the results.  Given zofran adn morphine in ED w/ some improvement.   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 and underwent a renal biopsy 2 wks ago to ensure there was no rejection- he did not receive a call back and has presumed it was normal  - at this point, he is asking me to discharge him today so that he can follow up with his renal doctors tomorrow at [facility]- I had originally planned to keep him hospitalized and consult nephrology for further work up of his renal failure but have have agreed to d/c him home as long as he will follow up with his nephrologists in the AM   Renal transplant recipient/ CKD - cont Prograf, Myfortic, Sensipar and low dose Prednisone  - outpt f/u with nephrologist tomorrow  HTN - holding  Lisinopril in setting of AKI   Protein-calorie malnutrition, severe - encourage PO intake  Procedures:  none  Consultations:  none  Discharge Exam: Filed Weights   09/19/14 2351  Weight: 73.8 kg (162 lb 11.2 oz)   Filed Vitals:   09/21/14 0646  BP: 181/85  Pulse: 78  Temp: 98.2 F (36.8 C)  Resp: 18    General: AAO x 3, no distress Cardiovascular: RRR, no murmurs  Respiratory: clear to auscultation bilaterally GI: soft, non-tender, non-distended, bowel sound positive  Discharge Instructions You were cared for by a hospitalist during your hospital stay. If you have any questions about your discharge medications or the care you received while you were in the hospital after you are discharged, you can call the unit and asked to speak with the hospitalist on call if the hospitalist that took care of you is not available. Once you are discharged, your primary care physician will handle any further medical issues. Please note that NO REFILLS for any discharge medications will be authorized once you are discharged, as it is imperative that you return to your primary care physician (or establish a relationship with a primary care physician if you do not have one) for your aftercare needs so that they can reassess your need for medications and monitor your lab values.      Discharge Instructions    Diet - low sodium heart healthy    Complete by:  As directed      Increase activity slowly    Complete by:  As  directed             Medication List    STOP taking these medications        lisinopril 5 MG tablet  Commonly known as:  PRINIVIL,ZESTRIL      TAKE these medications        cinacalcet 30 MG tablet  Commonly known as:  SENSIPAR  Take 30 mg by mouth daily.     ciprofloxacin 500 MG tablet  Commonly known as:  CIPRO  Take 1 tablet (500 mg total) by mouth daily.     metoprolol tartrate 25 MG tablet  Commonly known as:  LOPRESSOR  Take 1 tablet (25 mg total) by  mouth 2 (two) times daily.     mycophenolate 180 MG EC tablet  Commonly known as:  MYFORTIC  Take 720 mg by mouth 2 (two) times daily.     predniSONE 5 MG tablet  Commonly known as:  DELTASONE  Take 5 mg by mouth daily with breakfast.     ranitidine 150 MG tablet  Commonly known as:  ZANTAC  Take 150 mg by mouth daily as needed for heartburn.     tacrolimus 1 MG capsule  Commonly known as:  PROGRAF  Take 5 mg by mouth 2 (two) times daily.       No Known Allergies Follow-up Information    Schedule an appointment as soon as possible for a visit with [facility]   Contact information:   [address]  [place] [address] [place] [address] [phone]        The results of significant diagnostics from this hospitalization (including imaging, microbiology, ancillary and laboratory) are listed below for reference.    Significant Diagnostic Studies: No results found.  Microbiology: Recent Results (from the past 240 hour(s))  Urine culture     Status: None   Collection Time: 09/19/14  5:10 PM  Result Value Ref Range Status   Specimen Description URINE, CLEAN CATCH  Final   Special Requests NONE  Final   Culture  Setup Time   Final    09/19/2014 22:56 Performed at [facility]   Colony Count NO GROWTH Performed at [facility]  Final   Culture NO GROWTH Performed at [facility]  Final   Report Status 09/20/2014 FINAL  Final     Labs: Basic Metabolic Panel:  Recent Labs Lab 09/19/14 1405 09/20/14 0650 09/21/14 0610  NA 140 141 139  K 4.2 4.2 4.6  CL 107 113* 111  CO2 19 16* 13*  GLUCOSE 102* 79 85  BUN 30* 29* 30*  CREATININE 4.03* 3.92* 4.07*  CALCIUM 8.7 7.8* 8.3*   Liver Function Tests:  Recent Labs Lab 09/19/14 1405  AST 10  ALT 8  ALKPHOS 90  BILITOT 0.4  PROT 5.3*  ALBUMIN 1.9*   No results for input(s): LIPASE, AMYLASE in the last 168 hours. No results for input(s): AMMONIA in the last 168  hours. CBC:  Recent Labs Lab 09/19/14 1405 09/20/14 0650 09/21/14 0610  WBC 7.7 5.4 8.1  NEUTROABS 6.1  --   --   HGB 10.3* 8.7* 9.8*  HCT 32.9* 27.2* 31.0*  MCV 85.2 85.3 83.3  PLT 217 170 213   Cardiac Enzymes: No results for input(s): CKTOTAL, CKMB, CKMBINDEX, TROPONINI in the last 168 hours. BNP: BNP (last 3 results) No results for input(s): PROBNP in the last 8760 hours. CBG: No results for input(s): GLUCAP in the last 168  hours.     SignedCalvert Cantor, MD [facility] 09/21/2014, 10:24 AM

## 2014-09-21 NOTE — Progress Notes (Signed)
0830 bladder scan 266 ml, post void 0745.

## 2014-09-24 DIAGNOSIS — I10 Essential (primary) hypertension: Secondary | ICD-10-CM | POA: Insufficient documentation

## 2014-09-24 DIAGNOSIS — N179 Acute kidney failure, unspecified: Secondary | ICD-10-CM | POA: Insufficient documentation

## 2015-04-13 ENCOUNTER — Ambulatory Visit (INDEPENDENT_AMBULATORY_CARE_PROVIDER_SITE_OTHER): Payer: Medicare Other | Admitting: Physician Assistant

## 2015-04-13 VITALS — BP 145/80 | HR 75 | Temp 98.7°F | Resp 18 | Ht 71.0 in | Wt 165.0 lb

## 2015-04-13 DIAGNOSIS — N184 Chronic kidney disease, stage 4 (severe): Secondary | ICD-10-CM | POA: Diagnosis not present

## 2015-04-13 DIAGNOSIS — N508 Other specified disorders of male genital organs: Secondary | ICD-10-CM | POA: Diagnosis not present

## 2015-04-13 DIAGNOSIS — N5089 Other specified disorders of the male genital organs: Secondary | ICD-10-CM

## 2015-04-13 NOTE — Progress Notes (Signed)
Subjective:    Patient ID: Samuel Rowland, male    DOB: Nov 03, 1961, 53 y.o.   MRN: [id number]  HPI Patient with end-stage renal failure presents for right-sided scrotal lump that was first noticed 2 days ago. Was not worried initially as he has several masses from calcium deposits in his left scrotum from when he had a bladder leakage 7 years ago. Now, however, lump has become painful at times. Pain does not radiate. Denies erythema, increase in lump size, scrotal swelling, penile discharge/swelling, and fever. Has baseline decrease in appetite and nausea secondary to being weaned off prograft. Area does itch, but maybe secondary to rest of body itching. Sexually active with monogamous male partner of 2 1/2 years. HIV testing 02/2015 was negative. No recent travel outside of the country.   Has regular f/u with nephrologist, Dr. Jean Rosenthal. Has dialysis 3x/week. Does not have f/u with urology.  NKDA.   Review of Systems  Constitutional: Positive for appetite change. Negative for fever, chills, activity change and unexpected weight change.  Respiratory: Negative.  Negative for shortness of breath.   Cardiovascular: Negative.  Negative for chest pain, palpitations and leg swelling.  Gastrointestinal: Positive for nausea. Negative for vomiting and diarrhea.  Genitourinary: Positive for decreased urine volume and testicular pain. Negative for dysuria, urgency, frequency, hematuria, discharge, penile swelling, scrotal swelling and penile pain.  Neurological: Negative.  Negative for dizziness, light-headedness and headaches.  Psychiatric/Behavioral: Negative.        Objective:   Physical Exam  Constitutional: He is oriented to person, place, and time. He appears well-developed and well-nourished. No distress.  Blood pressure 145/80, pulse 75, temperature 98.7 F (37.1 C), temperature source Oral, resp. rate 18, height  (1.803 m), weight 165 lb (74.844 kg), SpO2 96 %.   HENT:  Head:  Normocephalic and atraumatic.  Right Ear: External ear normal.  Left Ear: External ear normal.  Eyes: Conjunctivae are normal. Right eye exhibits no discharge. Left eye exhibits no discharge.  Cardiovascular: Normal rate, regular rhythm and normal heart sounds.  Exam reveals no gallop and no friction rub.   No murmur heard. Pulmonary/Chest: Effort normal and breath sounds normal. No respiratory distress. He has no wheezes. He has no rales.  Abdominal: Soft. Bowel sounds are normal. He exhibits no distension and no mass. There is tenderness. There is no rebound and no guarding. Hernia confirmed negative in the right inguinal area and confirmed negative in the left inguinal area.  Genitourinary: Penis normal. Right testis shows mass. Right testis shows no swelling and no tenderness. Right testis is descended. Cremasteric reflex is not absent on the right side. Left testis shows mass (multiple calcium). Left testis shows no swelling and no tenderness. Left testis is descended. Uncircumcised. No phimosis, paraphimosis, hypospadias, penile erythema or penile tenderness. No discharge found.  Lymphadenopathy:       Right: No inguinal adenopathy present.       Left: No inguinal adenopathy present.  Neurological: He is alert and oriented to person, place, and time.  Skin: Skin is warm and dry. No rash noted. He is not diaphoretic. No erythema. No pallor.  Psychiatric: He has a normal mood and affect. His behavior is normal. Judgment and thought content normal.       Assessment & Plan:  1. Scrotal mass HIV testing neg 02/2015 per [facility]. - Ambulatory referral to Urology - GC/Chlamydia Probe Amp - RPR  2. Chronic kidney disease (CKD), stage IV (severe) F/u with Dr. Jean Rosenthal. CPE  recommend as patient has not seen primary care in over 2 years.    Janan Ridgeishira Darrell Leonhardt PA-C  [facility] 04/13/2015 1:41 PM

## 2015-04-14 LAB — RPR

## 2015-04-28 ENCOUNTER — Encounter: Payer: Self-pay | Admitting: Physician Assistant

## 2015-11-12 ENCOUNTER — Encounter (HOSPITAL_COMMUNITY): Payer: Self-pay | Admitting: Nurse Practitioner

## 2015-11-12 ENCOUNTER — Observation Stay (HOSPITAL_COMMUNITY)
Admission: EM | Admit: 2015-11-12 | Discharge: 2015-11-13 | Disposition: A | Discharge status: Home / Self Care | Payer: Medicare Other | Attending: Oncology | Admitting: Oncology

## 2015-11-12 ENCOUNTER — Emergency Department (HOSPITAL_COMMUNITY): Payer: Medicare Other

## 2015-11-12 DIAGNOSIS — N289 Disorder of kidney and ureter, unspecified: Secondary | ICD-10-CM | POA: Diagnosis not present

## 2015-11-12 DIAGNOSIS — I10 Essential (primary) hypertension: Secondary | ICD-10-CM | POA: Diagnosis not present

## 2015-11-12 DIAGNOSIS — I959 Hypotension, unspecified: Secondary | ICD-10-CM | POA: Diagnosis not present

## 2015-11-12 DIAGNOSIS — Z7901 Long term (current) use of anticoagulants: Secondary | ICD-10-CM | POA: Diagnosis not present

## 2015-11-12 DIAGNOSIS — Z79899 Other long term (current) drug therapy: Secondary | ICD-10-CM | POA: Diagnosis not present

## 2015-11-12 DIAGNOSIS — D649 Anemia, unspecified: Secondary | ICD-10-CM | POA: Diagnosis not present

## 2015-11-12 DIAGNOSIS — N189 Chronic kidney disease, unspecified: Secondary | ICD-10-CM

## 2015-11-12 DIAGNOSIS — I4891 Unspecified atrial fibrillation: Secondary | ICD-10-CM | POA: Diagnosis not present

## 2015-11-12 DIAGNOSIS — I480 Paroxysmal atrial fibrillation: Secondary | ICD-10-CM | POA: Diagnosis present

## 2015-11-12 DIAGNOSIS — D631 Anemia in chronic kidney disease: Secondary | ICD-10-CM | POA: Diagnosis present

## 2015-11-12 DIAGNOSIS — Z940 Kidney transplant status: Secondary | ICD-10-CM

## 2015-11-12 HISTORY — DX: Paroxysmal atrial fibrillation: I48.0

## 2015-11-12 HISTORY — DX: Gastro-esophageal reflux disease without esophagitis: K21.9

## 2015-11-12 HISTORY — DX: Cardiac murmur, unspecified: R01.1

## 2015-11-12 HISTORY — DX: Dependence on renal dialysis: Z99.2

## 2015-11-12 HISTORY — DX: Dependence on renal dialysis: N18.6

## 2015-11-12 LAB — I-STAT CHEM 8, ED
BUN: 54 mg/dL — AB (ref 6–20)
CHLORIDE: 97 mmol/L — AB (ref 101–111)
Calcium, Ion: 0.86 mmol/L — ABNORMAL LOW (ref 1.12–1.23)
Creatinine, Ser: 8 mg/dL — ABNORMAL HIGH (ref 0.61–1.24)
GLUCOSE: 86 mg/dL (ref 65–99)
HEMATOCRIT: 31 % — AB (ref 39.0–52.0)
HEMOGLOBIN: 10.5 g/dL — AB (ref 13.0–17.0)
POTASSIUM: 4.4 mmol/L (ref 3.5–5.1)
Sodium: 137 mmol/L (ref 135–145)
TCO2: 27 mmol/L (ref 0–100)

## 2015-11-12 LAB — COMPREHENSIVE METABOLIC PANEL
ALT: 20 U/L (ref 17–63)
AST: 22 U/L (ref 15–41)
Albumin: 3 g/dL — ABNORMAL LOW (ref 3.5–5.0)
Alkaline Phosphatase: 174 U/L — ABNORMAL HIGH (ref 38–126)
Anion gap: 18 — ABNORMAL HIGH (ref 5–15)
BUN: 47 mg/dL — ABNORMAL HIGH (ref 6–20)
CHLORIDE: 97 mmol/L — AB (ref 101–111)
CO2: 24 mmol/L (ref 22–32)
CREATININE: 8.11 mg/dL — AB (ref 0.61–1.24)
Calcium: 7.4 mg/dL — ABNORMAL LOW (ref 8.9–10.3)
GFR calc Af Amer: 8 mL/min — ABNORMAL LOW (ref >60)
GFR calc non Af Amer: 7 mL/min — ABNORMAL LOW (ref >60)
GLUCOSE: 90 mg/dL (ref 65–99)
POTASSIUM: 4.7 mmol/L (ref 3.5–5.1)
SODIUM: 139 mmol/L (ref 135–145)
Total Bilirubin: 0.6 mg/dL (ref 0.3–1.2)
Total Protein: 7.8 g/dL (ref 6.5–8.1)

## 2015-11-12 LAB — CBC WITH DIFFERENTIAL/PLATELET
BASOS ABS: 0 10*3/uL (ref 0.0–0.1)
Basophils Relative: 0 %
Eosinophils Absolute: 0.2 10*3/uL (ref 0.0–0.7)
Eosinophils Relative: 1 %
HEMATOCRIT: 26.2 % — AB (ref 39.0–52.0)
Hemoglobin: 8 g/dL — ABNORMAL LOW (ref 13.0–17.0)
Lymphocytes Relative: 14 %
Lymphs Abs: 2.1 10*3/uL (ref 0.7–4.0)
MCH: 27.7 pg (ref 26.0–34.0)
MCHC: 30.5 g/dL (ref 30.0–36.0)
MCV: 90.7 fL (ref 78.0–100.0)
MONOS PCT: 9 %
Monocytes Absolute: 1.4 10*3/uL — ABNORMAL HIGH (ref 0.1–1.0)
Neutro Abs: 11.3 10*3/uL — ABNORMAL HIGH (ref 1.7–7.7)
Neutrophils Relative %: 76 %
PLATELETS: 243 10*3/uL (ref 150–400)
RBC: 2.89 MIL/uL — ABNORMAL LOW (ref 4.22–5.81)
RDW: 22.9 % — ABNORMAL HIGH (ref 11.5–15.5)
WBC: 15 10*3/uL — ABNORMAL HIGH (ref 4.0–10.5)

## 2015-11-12 LAB — PROTIME-INR
INR: 2.11 — ABNORMAL HIGH (ref 0.00–1.49)
Prothrombin Time: 23.5 seconds — ABNORMAL HIGH (ref 11.6–15.2)

## 2015-11-12 LAB — I-STAT CG4 LACTIC ACID, ED: Lactic Acid, Venous: 3.3 mmol/L (ref 0.5–2.0)

## 2015-11-12 LAB — PROCALCITONIN: PROCALCITONIN: 0.6 ng/mL

## 2015-11-12 LAB — LACTIC ACID, PLASMA
LACTIC ACID, VENOUS: 3.5 mmol/L — AB (ref 0.5–2.0)
Lactic Acid, Venous: 3.9 mmol/L (ref 0.5–2.0)

## 2015-11-12 MED ORDER — SODIUM CHLORIDE 0.9 % IV BOLUS (SEPSIS)
250.0000 mL | Freq: Once | INTRAVENOUS | Status: AC
  Administered 2015-11-12: 250 mL via INTRAVENOUS

## 2015-11-12 MED ORDER — PREDNISONE 5 MG PO TABS
15.0000 mg | ORAL_TABLET | Freq: Every day | ORAL | Status: DC
  Administered 2015-11-13: 15 mg via ORAL
  Filled 2015-11-12: qty 1

## 2015-11-12 MED ORDER — ACETAMINOPHEN 650 MG RE SUPP: 650.0000 mg | Freq: Four times a day (QID) | RECTAL | Status: DC | PRN

## 2015-11-12 MED ORDER — ALBUTEROL SULFATE (2.5 MG/3ML) 0.083% IN NEBU: 2.5000 mg | INHALATION_SOLUTION | Freq: Four times a day (QID) | RESPIRATORY_TRACT | Status: DC | PRN

## 2015-11-12 MED ORDER — CINACALCET HCL 30 MG PO TABS
30.0000 mg | ORAL_TABLET | Freq: Every day | ORAL | Status: DC
  Administered 2015-11-13: 30 mg via ORAL
  Filled 2015-11-12 (×2): qty 1

## 2015-11-12 MED ORDER — FAMOTIDINE 20 MG PO TABS: 20.0000 mg | ORAL_TABLET | Freq: Every day | ORAL | Status: DC | PRN

## 2015-11-12 MED ORDER — ACETAMINOPHEN 325 MG PO TABS: 650.0000 mg | ORAL_TABLET | Freq: Four times a day (QID) | ORAL | Status: DC | PRN

## 2015-11-12 MED ORDER — ETOMIDATE 2 MG/ML IV SOLN
INTRAVENOUS | Status: AC | PRN
  Administered 2015-11-12: 10 mg via INTRAVENOUS

## 2015-11-12 MED ORDER — PROMETHAZINE HCL 25 MG RE SUPP: 12.5000 mg | Freq: Four times a day (QID) | RECTAL | Status: DC | PRN

## 2015-11-12 MED ORDER — WARFARIN - PHYSICIAN DOSING INPATIENT: Freq: Every day | Status: DC

## 2015-11-12 MED ORDER — PANTOPRAZOLE SODIUM 40 MG PO TBEC
40.0000 mg | DELAYED_RELEASE_TABLET | Freq: Two times a day (BID) | ORAL | Status: DC
  Administered 2015-11-12 – 2015-11-13 (×2): 40 mg via ORAL
  Filled 2015-11-12 (×2): qty 1

## 2015-11-12 MED ORDER — SODIUM CHLORIDE 0.9 % IV BOLUS (SEPSIS)
250.0000 mL | Freq: Once | INTRAVENOUS | Status: DC | PRN
Start: 2015-11-12 — End: 2015-11-13

## 2015-11-12 MED ORDER — ETOMIDATE 2 MG/ML IV SOLN
10.0000 mg | Freq: Once | INTRAVENOUS | Status: AC
  Filled 2015-11-12: qty 10

## 2015-11-12 MED ORDER — SEVELAMER CARBONATE 2.4 G PO PACK
2.4000 g | PACK | Freq: Three times a day (TID) | ORAL | Status: DC
  Administered 2015-11-13 (×2): 2.4 g via ORAL
  Filled 2015-11-12 (×4): qty 1

## 2015-11-12 MED ORDER — NEPRO/CARBSTEADY PO LIQD
237.0000 mL | Freq: Two times a day (BID) | ORAL | Status: DC
  Filled 2015-11-12 (×4): qty 237

## 2015-11-12 MED ORDER — SODIUM CHLORIDE 0.9 % IV SOLN
INTRAVENOUS | Status: DC
  Administered 2015-11-12: 10:00:00 via INTRAVENOUS

## 2015-11-12 MED ORDER — POLYSACCHARIDE IRON COMPLEX 150 MG PO CAPS
150.0000 mg | ORAL_CAPSULE | Freq: Two times a day (BID) | ORAL | Status: DC
  Administered 2015-11-12 – 2015-11-13 (×2): 150 mg via ORAL
  Filled 2015-11-12 (×2): qty 1

## 2015-11-12 MED ORDER — ETOMIDATE 2 MG/ML IV SOLN
INTRAVENOUS | Status: AC
  Filled 2015-11-12: qty 10

## 2015-11-12 MED ORDER — METOPROLOL TARTRATE 1 MG/ML IV SOLN
5.0000 mg | Freq: Once | INTRAVENOUS | Status: AC
  Administered 2015-11-12: 5 mg via INTRAVENOUS
  Filled 2015-11-12: qty 5

## 2015-11-12 MED ORDER — PROMETHAZINE HCL 25 MG PO TABS: 12.5000 mg | ORAL_TABLET | Freq: Four times a day (QID) | ORAL | Status: DC | PRN

## 2015-11-12 MED ORDER — WARFARIN SODIUM 2 MG PO TABS
2.0000 mg | ORAL_TABLET | Freq: Every day | ORAL | Status: DC
  Administered 2015-11-12 – 2015-11-13 (×2): 2 mg via ORAL
  Filled 2015-11-12 (×2): qty 1

## 2015-11-12 MED ORDER — PROMETHAZINE HCL 25 MG/ML IJ SOLN
12.5000 mg | Freq: Four times a day (QID) | INTRAMUSCULAR | Status: DC | PRN
  Administered 2015-11-12: 12.5 mg via INTRAVENOUS
  Filled 2015-11-12: qty 1

## 2015-11-12 NOTE — ED Notes (Signed)
Brought patient back to room via wheelchair; patient undressed, in gown, on monitor, continuous pulse oximetry and blood pressure cuff 

## 2015-11-12 NOTE — ED Notes (Signed)
Internal medicine at bedside

## 2015-11-12 NOTE — H&P (Signed)
Date: 11/12/2015               Patient Name:  Samuel Rowland MRN: [id number]  DOB: 04/09/1961 Age / Sex: 54 y.o., male   PCP: No Pcp Per Patient         Medical Service: Internal Medicine Teaching Service         Attending Physician: Dr. Levert FeinsteinJames M Granfortuna, MD    First Contact: Dr. Gwynn BurlyAndrew Wallace Pager: [phone]  Second Contact: Dr. Gara Kroneriana Truong Pager: [phone]       After Hours (After 5p/  First Contact Pager: [phone]  weekends / holidays): Second Contact Pager: [phone]   Chief Complaint: Lightheadedness after cardioversion for atrial fibrillation  History of Present Illness: Mr. Samuel Rowland is a 54yo M with PMH of ESRD on TTS HD via LUE AVF 2/2 HTN s/p failed DDKT in 2010, HFrEF (EF 30-35% via TTE in 08/2015), GERD, and newly-diagnosed atrial fibrillation on coumadin [facility] who presents with hypotension after cardioversion in the ED for Afib.   He was found to be back in Afib this morning, at his dialysis center before starting dialysis and transferred to the ED. He did not have any symptoms at that time, his maximum HR was 135, and his BP was normal. He was cardioverted and developed lightheadedness, dizzyness, and hypotension down to the 80s/60s soon thereafter. He was started on IV fluids and his pressures have slowly responded, now 105/77 at bedside. Currently, he says he lightheadedness is still present but improving. His only other current complaint is a nonproductive cough that started this morning without rhinorrhea, sore throat, or sick contacts.   He denies headache, fevers, syncope, vision changes, chest pain, palpitations, shortness of breath, nausea/vomiting, abdominal pain, weakness, numbness, changes in urination or bowel movements, or other new symptoms at this time. He does say for the past several months, he gets occasional 'flushing' episodes that last a few minutes, particularly at night, that resolve spontaneously. He  also feels 'bloated' with abdominal swelling in between dialysis sessions. He has had some generalized malaise for the past ~1 year attributed to his chronic DDKT rejection for which he is taking prednisone and mycophenolate. He denies other symptoms at this time.  Meds: Current Facility-Administered Medications  Medication Dose Route Frequency Provider Last Rate Last Dose  . 0.9 %  sodium chloride infusion   Intravenous Continuous Vanetta MuldersScott Zackowski, MD 10 mL/hr at 11/12/15 [id number]     Current Outpatient Prescriptions  Medication Sig Dispense Refill  . cinacalcet (SENSIPAR) 30 MG tablet Take 30 mg by mouth daily.    . iron polysaccharides (NIFEREX) 150 MG capsule Take 150 mg by mouth 2 (two) times daily.    . metoprolol (LOPRESSOR) 50 MG tablet Take 50 mg by mouth 2 (two) times daily.    . metoprolol tartrate (LOPRESSOR) 25 MG tablet Take 1 tablet (25 mg total) by mouth 2 (two) times daily. 60 tablet 0  . predniSONE (DELTASONE) 5 MG tablet Take 15 mg by mouth daily with breakfast.     . ranitidine (ZANTAC) 150 MG tablet Take 150 mg by mouth daily as needed for heartburn.    . sevelamer carbonate (RENVELA) 2.4 G PACK Take 2.4 g by mouth 3 (three) times daily with meals.    Marland Kitchen. sulfamethoxazole-trimethoprim (BACTRIM,SEPTRA) 400-80 MG tablet Take 1 tablet by mouth 3 (three) times a week.    . warfarin (COUMADIN) 2 MG tablet Take 2 mg by mouth daily.    .Marland Kitchen  mycophenolate (MYFORTIC) 180 MG EC tablet Take 720 mg by mouth 2 (two) times daily.      Allergies: Allergies as of 11/12/2015  . (No Known Allergies)   Past Medical History  Diagnosis Date  . Renal disorder   . Kidney transplant as cause of abnormal reaction or later complication   . Hypertension   . Nausea and vomiting     chronic  . Anemia   . Atrial fibrillation [facility])    Past Surgical History  Procedure Laterality Date  . Kidney transplant    . Back surgery      C5-6 anterior spinal fusion  . Cardioversion     No family history on  file. Social History   Social History  . Marital Status: Legally Separated    Spouse Name: N/A  . Number of Children: N/A  . Years of Education: N/A   Occupational History  . Not on file.   Social History Main Topics  . Smoking status: Never Smoker   . Smokeless tobacco: Never Used  . Alcohol Use: No  . Drug Use: No  . Sexual Activity: Yes   Other Topics Concern  . Not on file   Social History Narrative    Review of Systems: Pertinent items noted in HPI and remainder of comprehensive ROS otherwise negative.  Physical Exam: Blood pressure 95/67, pulse 74, temperature 97.9 F (36.6 C), temperature source Oral, resp. rate 26, SpO2 100 %.   Gen: Well-appearing, alert and oriented to person, place, and time HEENT: Oropharynx clear without erythema or exudate.  Neck: No cervical LAD, no thyromegaly or nodules, no JVD noted. CV: Normal rate, regular rhythm, no murmurs, rubs, or gallops. Distant heart sounds. Pulmonary: Normal effort, CTA bilaterally, no wheezing, rales, or rhonchi Abdominal: Soft, non-tender, mildy-distended, without rebound, guarding, or masses Extremities: Distal pulses 2+ in upper and lower extremities bilaterally, no tenderness, erythema or edema. LUE AVF with bruit and palpable thrill. Skin: No atypical appearing moles. No rashes  Lab results: Basic Metabolic Panel:  Recent Labs  16/10/96 0930 11/12/15 0953  NA 139 137  K 4.7 4.4  CL 97* 97*  CO2 24  --   GLUCOSE 90 86  BUN 47* 54*  CREATININE 8.11* 8.00*  CALCIUM 7.4*  --    Liver Function Tests:  Recent Labs  11/12/15 0930  AST 22  ALT 20  ALKPHOS 174*  BILITOT 0.6  PROT 7.8  ALBUMIN 3.0*   CBC:  Recent Labs  11/12/15 0930 11/12/15 0953  WBC 15.0*  --   NEUTROABS 11.3*  --   HGB 8.0* 10.5*  HCT 26.2* 31.0*  MCV 90.7  --   PLT 243  --    Coagulation:  Recent Labs  11/12/15 0930  LABPROT 23.5*  INR 2.11*   Imaging results:  Dg Chest Port 1 View  11/12/2015   CLINICAL DATA:  Tachycardia for 2 weeks, initial encounter EXAM: PORTABLE CHEST - 1 VIEW COMPARISON:  07/05/2014 FINDINGS: Cardiac shadow is enlarged. The lungs are well aerated bilaterally. No central vascular congestion is seen. No bony abnormality is noted. IMPRESSION: Cardiomegaly.  No other focal abnormality is seen. Electronically Signed   By: Alcide Clever M.D.   On: 11/12/2015 10:20   Other results: Post-cardioversion EKG: normal sinus rhythm.  Assessment & Plan by Problem: Active Problems:   Paroxysmal atrial fibrillation with RVR (HCC) 1. PAF with RVR s/p cardioversion complicated by hypotension - now in sinus rhythm with gradually improving symptoms and blood  pressure. Of note, was discharged 3 days ago from [facility] after being found in atrial flutter/fibrilaltion (also found without symptoms during dialysis), refractory to diltiazem and metoprolol so he was cardioverted. TEE before DCCV on 11/06/15 showed borderline dilation of LAA without any thrombus, and mild concentric LVH. His hypotension is responding to gentle fluids, and is most likely due to a rapidly decreased heart rate in the setting of underlying HFrEF as well B-blockers which may have blunted his autonomic response to cardioversion. However, cardioversion rarely causes hypotension directly so it is more likely the effect of these other factors. Patient does have WBC 15 on prednisone, lactate 3.3 in the setting of ESRD and CHF, procalcitonin 0.6 (threshold in ESRD is 0.75), so infection is unlikely causing his hypotension. -Continue gentle IV fluid boluses PRN for SBP <90 given ESRD and CHF -Continue home warfarin -F/u BCx -Hold home metoprolol  BID  2. HFrEF - recently diagnosed at [facility] in 08/2015. EF 30-35% - Hold home metoprolol and furosemide  BID on non dialysis days   3. ESRD on TTS HD s/p DDKT in 2010 - Has been back on HD for ~66yr due to chronic rejection of DDKT. Did not receive dialysis on 12/22 because  of heart rate. -Continue home prednisone -Consult nephrology tomorrow -Continue home sensipar  4. H/o GI bleed  - at hospitalization 1 week ago, 1 black bowel movement while on heparin, EGD showed esophagitis without obvious causes, started on PPI BID. -Continue protonix BID  PPX -DVT: coumadin  Diet: renal  Dispo: Disposition is deferred at this time, awaiting improvement of current medical problems. Anticipated discharge in approximately 1-3 day(s).   The patient does not have a current PCP (No Pcp Per Patient) and does need an [facility] hospital follow-up appointment after discharge.  The patient does not have transportation limitations that hinder transportation to clinic appointments.  Signed: Darrick Huntsman, MD 11/12/2015, 3:57 PM

## 2015-11-12 NOTE — ED Notes (Signed)
Patient denies pain and is resting comfortably.  

## 2015-11-12 NOTE — ED Provider Notes (Addendum)
CSN: [id number]     Arrival date & time 11/12/15  0910 History   First MD Initiated Contact with Patient 11/12/15 0915     Chief Complaint  Patient presents with  . Atrial Fibrillation     (Consider location/radiation/quality/duration/timing/severity/associated sxs/prior Treatment) The history is provided by the patient and the spouse.   patient is a dialysis patient. Normally dialyzed Tuesday Thursdays and Saturdays went to dialysis today and was noted to be tachycardic referred here for evaluation. Heart rate a dialysis 135 it's approximately when he arrived here with. Patient last week was in the hospital at [facility] for new onset atrial fibrillation. Patient was cardioverted and was started on Coumadin and on Coumadin now for about a week. Patient's been back on dialysis following his kidney transplant for about a year now. Patient denies any chest pain or any other significant symptoms.  Past Medical History  Diagnosis Date  . Renal disorder   . Kidney transplant as cause of abnormal reaction or later complication   . Hypertension   . Nausea and vomiting     chronic  . Anemia   . Atrial fibrillation [facility])    Past Surgical History  Procedure Laterality Date  . Kidney transplant    . Back surgery      C5-6 anterior spinal fusion  . Cardioversion     No family history on file. Social History  Substance Use Topics  . Smoking status: Never Smoker   . Smokeless tobacco: Never Used  . Alcohol Use: No    Review of Systems  Constitutional: Negative for fever.  HENT: Negative for congestion.   Eyes: Negative for redness.  Respiratory: Negative for shortness of breath.   Cardiovascular: Positive for palpitations. Negative for chest pain.  Gastrointestinal: Negative for abdominal pain.  Musculoskeletal: Negative for back pain.  Skin: Negative for rash.  Neurological: Negative for headaches.  Hematological: Bruises/bleeds easily.  Psychiatric/Behavioral: Negative for  confusion.      Allergies  Review of patient's allergies indicates no known allergies.  Home Medications   Prior to Admission medications   Medication Sig Start Date End Date Taking? Authorizing Provider  cinacalcet (SENSIPAR) 30 MG tablet Take 30 mg by mouth daily. 08/26/14  Yes Historical Provider, MD  iron polysaccharides (NIFEREX) 150 MG capsule Take 150 mg by mouth 2 (two) times daily.   Yes Historical Provider, MD  metoprolol (LOPRESSOR) 50 MG tablet Take 50 mg by mouth 2 (two) times daily.   Yes Historical Provider, MD  metoprolol tartrate (LOPRESSOR) 25 MG tablet Take 1 tablet (25 mg total) by mouth 2 (two) times daily. 09/21/14  Yes Calvert Cantor, MD  predniSONE (DELTASONE) 5 MG tablet Take 15 mg by mouth daily with breakfast.    Yes Historical Provider, MD  ranitidine (ZANTAC) 150 MG tablet Take 150 mg by mouth daily as needed for heartburn.   Yes Historical Provider, MD  sevelamer carbonate (RENVELA) 2.4 G PACK Take 2.4 g by mouth 3 (three) times daily with meals.   Yes Historical Provider, MD  sulfamethoxazole-trimethoprim (BACTRIM,SEPTRA) 400-80 MG tablet Take 1 tablet by mouth 3 (three) times a week.   Yes Historical Provider, MD  warfarin (COUMADIN) 2 MG tablet Take 2 mg by mouth daily.   Yes Historical Provider, MD  mycophenolate (MYFORTIC) 180 MG EC tablet Take 720 mg by mouth 2 (two) times daily.    Historical Provider, MD   BP 111/76 mmHg  Pulse 110  Temp(Src) 97.9 F (36.6 C) (Oral)  Resp  22  SpO2 92% Physical Exam  Constitutional: He is oriented to person, place, and time. He appears well-developed and well-nourished. No distress.  HENT:  Head: Normocephalic and atraumatic.  Mouth/Throat: Oropharynx is clear and moist.  Eyes: Conjunctivae and EOM are normal. Pupils are equal, round, and reactive to light.  Neck: Normal range of motion. Neck supple.  Cardiovascular:  No murmur heard. Patient with irregular heart rate tachycardic  Pulmonary/Chest: Effort normal  and breath sounds normal.  Abdominal: Soft. Bowel sounds are normal. There is no tenderness.  Musculoskeletal: Normal range of motion.  Neurological: He is alert and oriented to person, place, and time. No cranial nerve deficit. He exhibits normal muscle tone. Coordination normal.  Skin: Skin is warm. No rash noted.  Nursing note and vitals reviewed.   ED Course  Procedures (including critical care time) Labs Review Labs Reviewed  CBC WITH DIFFERENTIAL/PLATELET - Abnormal; Notable for the following:    WBC 15.0 (*)    RBC 2.89 (*)    Hemoglobin 8.0 (*)    HCT 26.2 (*)    RDW 22.9 (*)    Neutro Abs 11.3 (*)    Monocytes Absolute 1.4 (*)    All other components within normal limits  COMPREHENSIVE METABOLIC PANEL - Abnormal; Notable for the following:    Chloride 97 (*)    BUN 47 (*)    Creatinine, Ser 8.11 (*)    Calcium 7.4 (*)    Albumin 3.0 (*)    Alkaline Phosphatase 174 (*)    GFR calc non Af Amer 7 (*)    GFR calc Af Amer 8 (*)    Anion gap 18 (*)    All other components within normal limits  PROTIME-INR - Abnormal; Notable for the following:    Prothrombin Time 23.5 (*)    INR 2.11 (*)    All other components within normal limits  I-STAT CHEM 8, ED - Abnormal; Notable for the following:    Chloride 97 (*)    BUN 54 (*)    Creatinine, Ser 8.00 (*)    Calcium, Ion 0.86 (*)    Hemoglobin 10.5 (*)    HCT 31.0 (*)    All other components within normal limits   Results for orders placed or performed during the hospital encounter of 11/12/15  CBC with Differential/Platelet  Result Value Ref Range   WBC 15.0 (H) 4.0 - 10.5 K/uL   RBC 2.89 (L) 4.22 - 5.81 MIL/uL   Hemoglobin 8.0 (L) 13.0 - 17.0 g/dL   HCT 04.526.2 (L) 40.939.0 - 81.152.0 %   MCV 90.7 78.0 - 100.0 fL   MCH 27.7 26.0 - 34.0 pg   MCHC 30.5 30.0 - 36.0 g/dL   RDW 91.422.9 (H) 78.211.5 - 95.615.5 %   Platelets 243 150 - 400 K/uL   Neutrophils Relative % 76 %   Lymphocytes Relative 14 %   Monocytes Relative 9 %    Eosinophils Relative 1 %   Basophils Relative 0 %   Neutro Abs 11.3 (H) 1.7 - 7.7 K/uL   Lymphs Abs 2.1 0.7 - 4.0 K/uL   Monocytes Absolute 1.4 (H) 0.1 - 1.0 K/uL   Eosinophils Absolute 0.2 0.0 - 0.7 K/uL   Basophils Absolute 0.0 0.0 - 0.1 K/uL   RBC Morphology POLYCHROMASIA PRESENT   Comprehensive metabolic panel  Result Value Ref Range   Sodium 139 135 - 145 mmol/L   Potassium 4.7 3.5 - 5.1 mmol/L   Chloride 97 (L) 101 -  111 mmol/L   CO2 24 22 - 32 mmol/L   Glucose, Bld 90 65 - 99 mg/dL   BUN 47 (H) 6 - 20 mg/dL   Creatinine, Ser 5.28 (H) 0.61 - 1.24 mg/dL   Calcium 7.4 (L) 8.9 - 10.3 mg/dL   Total Protein 7.8 6.5 - 8.1 g/dL   Albumin 3.0 (L) 3.5 - 5.0 g/dL   AST 22 15 - 41 U/L   ALT 20 17 - 63 U/L   Alkaline Phosphatase 174 (H) 38 - 126 U/L   Total Bilirubin 0.6 0.3 - 1.2 mg/dL   GFR calc non Af Amer 7 (L) >60 mL/min   GFR calc Af Amer 8 (L) >60 mL/min   Anion gap 18 (H) 5 - 15  Protime-INR  Result Value Ref Range   Prothrombin Time 23.5 (H) 11.6 - 15.2 seconds   INR 2.11 (H) 0.00 - 1.49  I-Stat Chem 8, ED  Result Value Ref Range   Sodium 137 135 - 145 mmol/L   Potassium 4.4 3.5 - 5.1 mmol/L   Chloride 97 (L) 101 - 111 mmol/L   BUN 54 (H) 6 - 20 mg/dL   Creatinine, Ser 4.13 (H) 0.61 - 1.24 mg/dL   Glucose, Bld 86 65 - 99 mg/dL   Calcium, Ion 2.44 (L) 1.12 - 1.23 mmol/L   TCO2 27 0 - 100 mmol/L   Hemoglobin 10.5 (L) 13.0 - 17.0 g/dL   HCT 01.0 (L) 27.2 - 53.6 %     Imaging Review Dg Chest Port 1 View  11/12/2015  CLINICAL DATA:  Tachycardia for 2 weeks, initial encounter EXAM: PORTABLE CHEST - 1 VIEW COMPARISON:  07/05/2014 FINDINGS: Cardiac shadow is enlarged. The lungs are well aerated bilaterally. No central vascular congestion is seen. No bony abnormality is noted. IMPRESSION: Cardiomegaly.  No other focal abnormality is seen. Electronically Signed   By: Alcide Clever M.D.   On: 11/12/2015 10:20   I have personally reviewed and evaluated these images and lab  results as part of my medical decision-making.   EKG Interpretation   Date/Time:  Thursday November 12 2015 09:18:41 EST Ventricular Rate:  134 PR Interval:  101 QRS Duration: 99 QT Interval:  310 QTC Calculation: 463 R Axis:   -34 Text Interpretation:  Sinus tachycardia Left axis deviation Abnormal  R-wave progression, early transition Nonspecific repol abnormality,  diffuse leads No previous ECGs available Confirmed by Lulubelle Simcoe  MD, Devante Capano  [phone]) on 11/12/2015 9:25:40 AM      CRITICAL CARE Performed by: Vanetta Mulders Total critical care time: 30 minutes Critical care time was exclusive of separately billable procedures and treating other patients. Critical care was necessary to treat or prevent imminent or life-threatening deterioration. Critical care was time spent personally by me on the following activities: development of treatment plan with patient and/or surrogate as well as nursing, discussions with consultants, evaluation of patient's response to treatment, examination of patient, obtaining history from patient or surrogate, ordering and performing treatments and interventions, ordering and review of laboratory studies, ordering and review of radiographic studies, pulse oximetry and re-evaluation of patient's condition.  Procedural sedation Performed by: Vanetta Mulders Consent: Verbal consent obtained. Risks and benefits: risks, benefits and alternatives were discussed Required items: required blood products, implants, devices, and special equipment available Patient identity confirmed: arm band and provided demographic data Time out: Immediately prior to procedure a "time out" was called to verify the correct patient, procedure, equipment, support staff and site/side marked as required.  Sedation type: moderate (  conscious) sedation NPO time confirmed and considedered  Sedatives: ETOMIDATE 10 mg  Physician Time at Bedside: 30  Vitals: Vital signs were monitored  during sedation. Cardiac Monitor, pulse oximeter Patient tolerance: Patient tolerated the procedure well with no immediate complications. Comments: Pt with uneventful recovered. Returned to pre-procedural sedation baseline    Patient following the etomidate was cardioverted 120 J synchronized with return to sinus rhythm heart rate 79 repeat EKG shows and confirms sinus rhythm. Patient tolerated the procedure well. Patient was placed on 2 L of nasal cannula oxygen prior procedure during procedure patient was placed on her percent nonrebreather. During the procedure there was a loss of the pulse ox rhythm and patient was briefly bagged until he could get another pulse ox in place which showed that he was 100% oxygen. Patient woke up from the etomidate appropriately and is feeling fine.   MDM   Final diagnoses:  Atrial fibrillation with rapid ventricular response [facility])   Patient with new onset of atrial fibrillation a week ago [facility] was cardioverted for started on Lopressor twice a day. Patient also on Coumadin. Patient's labs today including INR is in the 2-3 range. No significant Dolphus Jenny is on labs. Chest x-ray shows no significant pulmonary edema. Patient is a dialysis patient was due to be dialyzed today however heart rate was in the 135 range so he was sent here. EKG does confirm atrial fibrillation with rapid rate. While waiting for labs patient did receive a 5 mg of Lopressor slowing his heart rate down but he still tachycardic above 100. Discussed with Dr. Patty Sermons in cardiology for consideration for cardioversion they agree it is appropriate. I will move patient to resuscitation room patient will be given etomidate and will be cardioverted.  In part part of the cardiology consultation was to be certain it was okay to cardiovert since we don't have complete records from [facility]. But they stated since they cardioverted him there most likely they definitely did an  echocardiogram to make sure there was no retained clot at the time they did a cardioversion. Patient does seem to relate that that was the case.    Vanetta Mulders, MD 11/12/15 1113  Vanetta Mulders, MD 11/12/15 1121  Vanetta Mulders, MD 11/12/15 1221  Vanetta Mulders, MD 11/12/15 1225   Procedures patient's blood pressures were fine when he is brought back to the ED upon following the resuscitation room patient started to develop some low blood pressures and feeling dizzy and lightheaded. No chest pain no severe headache. Patient is a dialysis patient so fluids will be need to given gently will also sore to screen for sepsis however lactic acid may be elevated just due to the fact that the he is on routine dialysis. Patient will require admission at this point in time. Blood cultures have been ordered lactic acid ordered. Not going to order antibiotics at this time. Not the tachypnea, not tachycardic.  Vanetta Mulders, MD 11/12/15 1346

## 2015-11-12 NOTE — ED Notes (Signed)
Pt transferred to Trauma C for elective cardioversion, consent signed and at bedside, IV confirmed, Zoll pads placed

## 2015-11-12 NOTE — ED Notes (Signed)
Consent obtained for cardioversion.

## 2015-11-12 NOTE — ED Notes (Signed)
Pt awake, talking, taking PO well, on room air and in NAD

## 2015-11-12 NOTE — Sedation Documentation (Signed)
Patient shocked at 120J

## 2015-11-12 NOTE — ED Notes (Signed)
Pt here to be evaluated for rapid atrial fibrillation noted at dialysis today- rate as high as 135 bpm. Patient was recently diagnosed with a.fib at [facility] and discharged this week. He was rx. Metoprolol 50mg  BID and warfarin 2mg  and has been taking as prescribed. Patient went in to dialysis today and noted that his heart rate was fast and irregular. Patient denies chest pain, lightheadedness, dizziness, shortness of breath.   Patient sts when he was at [facility] for this he was cardioverted.

## 2015-11-13 ENCOUNTER — Encounter (HOSPITAL_COMMUNITY): Payer: Self-pay | Admitting: Student

## 2015-11-13 DIAGNOSIS — N186 End stage renal disease: Secondary | ICD-10-CM | POA: Diagnosis not present

## 2015-11-13 DIAGNOSIS — I420 Dilated cardiomyopathy: Secondary | ICD-10-CM | POA: Diagnosis not present

## 2015-11-13 DIAGNOSIS — I10 Essential (primary) hypertension: Secondary | ICD-10-CM

## 2015-11-13 DIAGNOSIS — I4891 Unspecified atrial fibrillation: Secondary | ICD-10-CM | POA: Diagnosis not present

## 2015-11-13 DIAGNOSIS — I483 Typical atrial flutter: Secondary | ICD-10-CM | POA: Diagnosis not present

## 2015-11-13 LAB — PROTIME-INR
INR: 2.32 — ABNORMAL HIGH (ref 0.00–1.49)
PROTHROMBIN TIME: 25.2 s — AB (ref 11.6–15.2)

## 2015-11-13 LAB — RENAL FUNCTION PANEL
ALBUMIN: 2.8 g/dL — AB (ref 3.5–5.0)
ANION GAP: 19 — AB (ref 5–15)
BUN: 57 mg/dL — ABNORMAL HIGH (ref 6–20)
CALCIUM: 7.8 mg/dL — AB (ref 8.9–10.3)
CO2: 21 mmol/L — ABNORMAL LOW (ref 22–32)
Chloride: 98 mmol/L — ABNORMAL LOW (ref 101–111)
Creatinine, Ser: 9.43 mg/dL — ABNORMAL HIGH (ref 0.61–1.24)
GFR, EST AFRICAN AMERICAN: 6 mL/min — AB (ref >60)
GFR, EST NON AFRICAN AMERICAN: 6 mL/min — AB (ref >60)
GLUCOSE: 53 mg/dL — AB (ref 65–99)
PHOSPHORUS: 7.3 mg/dL — AB (ref 2.5–4.6)
Potassium: 5.9 mmol/L — ABNORMAL HIGH (ref 3.5–5.1)
SODIUM: 138 mmol/L (ref 135–145)

## 2015-11-13 LAB — HEPATITIS B SURFACE ANTIGEN: HEP B S AG: NEGATIVE

## 2015-11-13 MED ORDER — HEPARIN SODIUM (PORCINE) 1000 UNIT/ML DIALYSIS: 1000.0000 [IU] | INTRAMUSCULAR | Status: DC | PRN

## 2015-11-13 MED ORDER — SODIUM CHLORIDE 0.9 % IV SOLN: 100.0000 mL | INTRAVENOUS | Status: DC | PRN

## 2015-11-13 MED ORDER — LIDOCAINE-PRILOCAINE 2.5-2.5 % EX CREA: 1.0000 "application " | TOPICAL_CREAM | CUTANEOUS | Status: DC | PRN

## 2015-11-13 MED ORDER — AMIODARONE HCL 400 MG PO TABS: ORAL_TABLET | ORAL | Status: DC

## 2015-11-13 MED ORDER — CINACALCET HCL 30 MG PO TABS
30.0000 mg | ORAL_TABLET | Freq: Two times a day (BID) | ORAL | Status: DC
  Filled 2015-11-13 (×2): qty 1

## 2015-11-13 MED ORDER — RENA-VITE PO TABS: 1.0000 | ORAL_TABLET | Freq: Every day | ORAL | Status: DC

## 2015-11-13 MED ORDER — LIDOCAINE HCL (PF) 1 % IJ SOLN: 5.0000 mL | INTRAMUSCULAR | Status: DC | PRN

## 2015-11-13 MED ORDER — LIDOCAINE-PRILOCAINE 2.5-2.5 % EX CREA
1.0000 "application " | TOPICAL_CREAM | CUTANEOUS | Status: DC | PRN
  Filled 2015-11-13: qty 5

## 2015-11-13 MED ORDER — ALTEPLASE 2 MG IJ SOLR
2.0000 mg | Freq: Once | INTRAMUSCULAR | Status: DC | PRN
  Filled 2015-11-13: qty 2

## 2015-11-13 MED ORDER — METOPROLOL TARTRATE 25 MG PO TABS: 25.0000 mg | ORAL_TABLET | Freq: Two times a day (BID) | ORAL | Status: DC

## 2015-11-13 MED ORDER — PENTAFLUOROPROP-TETRAFLUOROETH EX AERO: 1.0000 "application " | INHALATION_SPRAY | CUTANEOUS | Status: DC | PRN

## 2015-11-13 MED ORDER — ALTEPLASE 2 MG IJ SOLR: 2.0000 mg | Freq: Once | INTRAMUSCULAR | Status: DC | PRN

## 2015-11-13 MED ORDER — METOPROLOL TARTRATE 25 MG PO TABS: 25.0000 mg | ORAL_TABLET | Freq: Two times a day (BID) | ORAL | Status: AC

## 2015-11-13 MED ORDER — AMIODARONE HCL 200 MG PO TABS: 400.0000 mg | ORAL_TABLET | Freq: Two times a day (BID) | ORAL | Status: DC

## 2015-11-13 MED ORDER — HEPARIN SODIUM (PORCINE) 1000 UNIT/ML DIALYSIS: 20.0000 [IU]/kg | INTRAMUSCULAR | Status: DC | PRN

## 2015-11-13 NOTE — Procedures (Signed)
Patient was seen on dialysis and the procedure was supervised.  BFR 350  Via AVF BP is  121/83.   Patient appears to be tolerating treatment well- set lower goal - may need his EDW up as OP   Samuel Rowland 11/13/2015

## 2015-11-13 NOTE — Consult Note (Signed)
[facility] Renal Consultation Note    Indication for Consultation:  Management of ESRD/hemodialysis; anemia, hypertension/volume and secondary hyperparathyroidism PCP:  None  HPI: Samuel Rowland is a 54 y.o. male who dialyzes TTS at [facility] with a history of renal transplant 2010 and resumed HD about a year ago.  He was d/c from [facility] 12/19 following an admission for new onset afib with cardioversion.  His meds had been simplified to metoprolol and coumadin. He dialyzed without sequelae 12/20 at which time his Ca bath was changed to 2.5 per dialysis notes.  His BPs ran from 130/85 down to 105/70 during treatment. His pre and post BPs were 140/90s down to 127-130/70s post.  Post HD weight was 163 # (EDW 157#).  When he presented to outpatient HD yesterday he was asymptomatic with a HR of 135 irreg  BP normal He was sent to [facility] After evaluation here, he was cardioverted and subsequently developed lightheadedness, dizziness and BP drop.  He had no CP, SOB, N, V, abdominal pain.  I saw him on HD and he has no complaints  Past Medical History  Diagnosis Date  . Kidney transplant as cause of abnormal reaction or later complication   . Hypertension   . Nausea and vomiting     chronic  . Anemia   . Atrial fibrillation (HCC)   . Heart murmur   . Paroxysmal atrial fibrillation with RVR (HCC)   . GERD (gastroesophageal reflux disease)   . Renal disorder     S/P right transplant  . ESRD (end stage renal disease) on dialysis [facility])     "[facility]; TTS since ~10/2014" (11/12/2015)   Past Surgical History  Procedure Laterality Date  . Kidney transplant Right 09/2009    "[facility]"  . Anterior cervical decomp/discectomy fusion  01/1999    C5-6   . Cardioversion    . Back surgery    . Colonoscopy w/ polypectomy  10/2009  . Esophagogastroduodenoscopy  10/2009  . Renal biopsy  2015   History reviewed. No pertinent family history. Social History:  reports  that he has quit smoking. His smoking use included Cigars. He has never used smokeless tobacco. He reports that he does not drink alcohol or use illicit drugs. No Known Allergies Prior to Admission medications   Medication Sig Start Date End Date Taking? Authorizing Provider  cinacalcet (SENSIPAR) 30 MG tablet Take 30 mg by mouth daily. 08/26/14  Yes Historical Provider, MD  iron polysaccharides (NIFEREX) 150 MG capsule Take 150 mg by mouth 2 (two) times daily.   Yes Historical Provider, MD  metoprolol (LOPRESSOR) 50 MG tablet Take 50 mg by mouth 2 (two) times daily.   Yes Historical Provider, MD  metoprolol tartrate (LOPRESSOR) 25 MG tablet Take 1 tablet (25 mg total) by mouth 2 (two) times daily. 09/21/14  Yes Calvert Cantor, MD  predniSONE (DELTASONE) 5 MG tablet Take 15 mg by mouth daily with breakfast.    Yes Historical Provider, MD  ranitidine (ZANTAC) 150 MG tablet Take 150 mg by mouth daily as needed for heartburn.   Yes Historical Provider, MD  sevelamer carbonate (RENVELA) 2.4 G PACK Take 2.4 g by mouth 3 (three) times daily with meals.   Yes Historical Provider, MD  sulfamethoxazole-trimethoprim (BACTRIM,SEPTRA) 400-80 MG tablet Take 1 tablet by mouth 3 (three) times a week.   Yes Historical Provider, MD  warfarin (COUMADIN) 2 MG tablet Take 2 mg by mouth daily.   Yes Historical Provider,  MD  mycophenolate (MYFORTIC) 180 MG EC tablet Take 720 mg by mouth 2 (two) times daily.    Historical Provider, MD   Current Facility-Administered Medications  Medication Dose Route Frequency Provider Last Rate Last Dose  . 0.9 %  sodium chloride infusion   Intravenous Continuous Vanetta MuldersScott Zackowski, MD 10 mL/hr at 11/12/15 [phone]    . acetaminophen (TYLENOL) tablet 650 mg  650 mg Oral Q6H PRN Marrian SalvageJacquelyn S Gill, MD       Or  . acetaminophen (TYLENOL) suppository 650 mg  650 mg Rectal Q6H PRN Marrian SalvageJacquelyn S Gill, MD      . albuterol (PROVENTIL) (2.5 MG/3ML) 0.083% nebulizer solution 2.5 mg  2.5 mg Inhalation Q6H  PRN Marrian SalvageJacquelyn S Gill, MD      . cinacalcet (SENSIPAR) tablet 30 mg  30 mg Oral Q breakfast Marrian SalvageJacquelyn S Gill, MD   30 mg at 11/13/15 0810  . famotidine (PEPCID) tablet 20 mg  20 mg Oral Daily PRN Marrian SalvageJacquelyn S Gill, MD      . feeding supplement (NEPRO CARB STEADY) liquid 237 mL  237 mL Oral BID BM Levert FeinsteinJames M Granfortuna, MD      . iron polysaccharides (NIFEREX) capsule 150 mg  150 mg Oral BID Marrian SalvageJacquelyn S Gill, MD   150 mg at 11/12/15 2157  . multivitamin (RENA-VIT) tablet 1 tablet  1 tablet Oral QHS Weston SettleMartha Bergman, PA-C      . pantoprazole (PROTONIX) EC tablet 40 mg  40 mg Oral BID Marrian SalvageJacquelyn S Gill, MD   40 mg at 11/12/15 1832  . predniSONE (DELTASONE) tablet 15 mg  15 mg Oral Q breakfast Marrian SalvageJacquelyn S Gill, MD   15 mg at 11/13/15 0810  . promethazine (PHENERGAN) tablet 12.5 mg  12.5 mg Oral Q6H PRN Deneise LeverParth Saraiya, MD       Or  . promethazine (PHENERGAN) injection 12.5 mg  12.5 mg Intravenous Q6H PRN Deneise LeverParth Saraiya, MD   12.5 mg at 11/12/15 2059   Or  . promethazine (PHENERGAN) suppository 12.5 mg  12.5 mg Rectal Q6H PRN Deneise LeverParth Saraiya, MD      . sevelamer carbonate (RENVELA) powder PACK 2.4 g  2.4 g Oral TID WC Marrian SalvageJacquelyn S Gill, MD   2.4 g at 11/13/15 0811  . sodium chloride 0.9 % bolus 250 mL  250 mL Intravenous Once PRN Marrian SalvageJacquelyn S Gill, MD      . warfarin (COUMADIN) tablet 2 mg  2 mg Oral Daily Marrian SalvageJacquelyn S Gill, MD   2 mg at 11/12/15 1832  . Warfarin - Physician Dosing Inpatient   Does not apply q1800 Levert FeinsteinJames M Granfortuna, MD       Labs: Basic Metabolic Panel:  Recent Labs Lab 11/12/15 0930 11/12/15 0953 11/13/15 0506  NA 139 137 138  K 4.7 4.4 5.9*  CL 97* 97* 98*  CO2 24  --  21*  GLUCOSE 90 86 53*  BUN 47* 54* 57*  CREATININE 8.11* 8.00* 9.43*  CALCIUM 7.4*  --  7.8*  PHOS  --   --  7.3*   Liver Function Tests:  Recent Labs Lab 11/12/15 0930 11/13/15 0506  AST 22  --   ALT 20  --   ALKPHOS 174*  --   BILITOT 0.6  --   PROT 7.8  --   ALBUMIN 3.0* 2.8*   CBC:  Recent  Labs Lab 11/12/15 0930 11/12/15 0953  WBC 15.0*  --   NEUTROABS 11.3*  --   HGB 8.0* 10.5*  HCT 26.2*  31.0*  MCV 90.7  --   PLT 243  --   Studies/Results: Dg Chest Port 1 View  11/12/2015  CLINICAL DATA:  Tachycardia for 2 weeks, initial encounter EXAM: PORTABLE CHEST - 1 VIEW COMPARISON:  07/05/2014 FINDINGS: Cardiac shadow is enlarged. The lungs are well aerated bilaterally. No central vascular congestion is seen. No bony abnormality is noted. IMPRESSION: Cardiomegaly.  No other focal abnormality is seen. Electronically Signed   By: Alcide Clever M.D.   On: 11/12/2015 10:20    ROS: As per HPI otherwise negative.  Physical Exam: STANDING WT PRE HD = 6.1 above EDW -  Filed Vitals:   11/12/15 1723 11/12/15 1729 11/12/15 2031 11/13/15 0621  BP: 96/67  95/66 120/79  Pulse: 76  84 83  Temp: 99.4 F (37.4 C)  98.8 F (37.1 C) 98.1 F (36.7 C)  TempSrc: Oral  Oral Oral  Resp: 18     Height:  (1.778 m)  (1.778 m)    Weight:  77.701 kg (171 lb 4.8 oz)  77.066 kg (169 lb 14.4 oz)  SpO2: 98%  100% 100%     General: Well developed, well nourished, in no acute distress.on HD  Head: Normocephalic, atraumatic, sclera non-icteric, mucus membranes are moist Neck: Supple. JVD not elevated. Lungs: Clear bilaterally to auscultation without wheezes, rales, or rhonchi. Breathing is unlabored. Heart: RRR with S1 S2.  Abdomen: Soft, non-tender, non-distended with normoactive bowel sounds. No rebound/guarding. No obvious abdominal masses. M-S:  Strength and tone appear normal for age. Lower extremities:without edema or ischemic changes, no open wounds  Neuro: Alert and oriented X 3. Moves all extremities spontaneously. Psych:  Responds to questions appropriately with a normal affect. Dialysis Access:left AVF Qb 350   Dialysis Orders:  [facility] MWF 200 2 K 2.5 Ca( just changed from 2 Ca) EDW 157 # no heparin left AVF 16 350 800   Assessment/Plan: 1. Rapid afib - newly diagnosed - just  d/c from [facility] post cardioversion12/19- had outpt HD 12/20; cardioverted again 12/22, on BB, coumadin 2. ESRD - TTS HD today off schedule - pt already cannulated with16s - Qb 350- if not d/c today - needs to dialyzed again  on Sat to keep on schedule; HD orders written for Saturday if not d/c today 3. Anemia - Received Aranesp 100 12/20 - he refused venofer- continue niferex 4. Secondary hyperparathyroidism - continue sensipr/renvela powder/ no VDRA 5. HTN/volume - meds recently changed - only on MTP now- 6.51 above EDW - will just set for 3.5 today because he should have HD again her tomorrow if still here or outpt if d/c later today- may actually need edw raised a little; CXR clear- agree 6. Nutrition - renal diet/vit 7. Hx of failed renal tx- prednisone 8. Low grade temp - WBC 15 - recent hospitalization BC drawn - no abtx yet  Sheffield Slider, PA-C [facility] Beeper [phone] 11/13/2015, 12:17 PM   Patient seen and examined, agree with above note with above modifications. Pt with ESRD after failed tx- runs at [facility]- admitted with symptomatic Afib s/p cardioversion- needed HD today off schedule then could be discharged- knows he needs to go for his OP treatment tomorrow if goes home later today  Annie Sable, MD 11/13/2015

## 2015-11-13 NOTE — Consult Note (Signed)
CARDIOLOGY CONSULT NOTE   Patient ID: Samuel Rowland MRN: [id number], DOB/AGE: [birth year]   Admit date: 11/12/2015 Date of Consult: 11/13/2015 Reason for Consult: Atrial Fibrillation  Primary Physician: No PCP Per Patient Primary Cardiologist: New  HPI: Samuel Rowland is a 54 y.o. male with past medical history of HTN, PAF (on Coumadin), chronic systolic CHF (EF 40-98%) and ESRD (dialysis TTS) who presented to [facility] ED on 11/12/2015 for tachycardia.   He had been at dialysis earlier that day and was found to be tachycardiac into the 130's. He was noted to be in atrial flutter upon arrival to the ED. He was cardioverted with 120 J and had successful return to NSR. He was noted to be lightheaded and hypotensive following the procedure. He also had a WBC count elevated to 15000 and was admitted for further evaluation.   He has maintained NSR while admitted with rates in the 70's -80's. He does have frequent PVC's on telemetry. His Metoprolol has been held secondary to hypotension. He denies any chest pain, palpitations, dyspnea, orthopnea, PND, or lower extremity edema. Reports feeling well at this time.  He was recently admitted from 11/03/2015 to 11/09/2015 at [facility] for new onset atrial flutter and underwent successful cardioversion on 11/06/2015 with return to NSR. He was started on Coumadin for anticoagulation and Lopressor  BID for rate control.  He had an echocardiogram that admission which showed an EF of 30-35% and his TEE showed borderline dilation of the LAA without thrombus. He reports having never seen a Cardiologist before that admission. Has never had a cardiac catheterization.  The patient is unaware of any family history of CAD.    Problem List  Past Medical History  Diagnosis Date  . Kidney transplant as cause of abnormal reaction or later complication   . Hypertension   . Nausea and vomiting     chronic  . Anemia   . Atrial fibrillation (HCC)   . Heart  murmur   . Paroxysmal atrial fibrillation with RVR (HCC)   . GERD (gastroesophageal reflux disease)   . Renal disorder     S/P right transplant  . ESRD (end stage renal disease) on dialysis [facility])     "[facility]; TTS since ~10/2014" (11/12/2015)    Past Surgical History  Procedure Laterality Date  . Kidney transplant Right 09/2009    "[facility]"  . Anterior cervical decomp/discectomy fusion  01/1999    C5-6   . Cardioversion    . Back surgery    . Colonoscopy w/ polypectomy  10/2009  . Esophagogastroduodenoscopy  10/2009  . Renal biopsy  2015     Allergies  No Known Allergies    Inpatient Medications  . cinacalcet  30 mg Oral BID AC & HS  . iron polysaccharides  150 mg Oral BID  . multivitamin  1 tablet Oral QHS  . pantoprazole  40 mg Oral BID  . predniSONE  15 mg Oral Q breakfast  . sevelamer carbonate  2.4 g Oral TID WC  . warfarin  2 mg Oral Daily  . Warfarin - Physician Dosing Inpatient   Does not apply q8    Family History Family History  Problem Relation Age of Onset  . Hypertension Father      Social History Social History   Social History  . Marital Status: Legally Separated    Spouse Name: N/A  . Number of Children: N/A  . Years of Education: N/A   Occupational History  .  Not on file.   Social History Main Topics  . Smoking status: Former Smoker -- 22 years    Types: Cigars  . Smokeless tobacco: Never Used     Comment: "stopped smoking in 1998"  . Alcohol Use: No  . Drug Use: No  . Sexual Activity: Not Currently   Other Topics Concern  . Not on file   Social History Narrative     Review of Systems General:  No chills, fever, night sweats or weight changes.  Cardiovascular:  No chest pain, dyspnea on exertion, edema, orthopnea, palpitations, paroxysmal nocturnal dyspnea. Dermatological: No rash, lesions/masses Respiratory: No cough, dyspnea Urologic: No hematuria, dysuria Abdominal:   No nausea, vomiting, diarrhea,  bright red blood per rectum, melena, or hematemesis Neurologic:  No visual changes, wkns, changes in mental status. All other systems reviewed and are otherwise negative except as noted above.  Physical Exam Blood pressure 110/71, pulse 70, temperature 97.9 F (36.6 C), temperature source Oral, resp. rate 21, height  (1.778 m), weight 170 lb 3.1 oz (77.2 kg), SpO2 100 %.  General: Pleasant, African American male in NAD. Seen in dialysis. Psych: Normal affect. Neuro: Alert and oriented X 3. Moves all extremities spontaneously. HEENT: Normal  Neck: Supple without bruits or JVD. Lungs:  Resp regular and unlabored, CTA without wheezing or rales. Heart: RRR no s3, s4, or murmurs. Abdomen: Soft, non-tender, non-distended, BS + x 4.  Extremities: No clubbing, cyanosis or edema. DP/PT/Radials 2+ and equal bilaterally. AVF on left.  Labs No results for input(s): CKTOTAL, CKMB, TROPONINI in the last 72 hours. Lab Results  Component Value Date   WBC 15.0* 11/12/2015   HGB 10.5* 11/12/2015   HCT 31.0* 11/12/2015   MCV 90.7 11/12/2015   PLT 243 11/12/2015    Recent Labs Lab 11/12/15 0930  11/13/15 0506  NA 139  < > 138  K 4.7  < > 5.9*  CL 97*  < > 98*  CO2 24  --  21*  BUN 47*  < > 57*  CREATININE 8.11*  < > 9.43*  CALCIUM 7.4*  --  7.8*  PROT 7.8  --   --   BILITOT 0.6  --   --   ALKPHOS 174*  --   --   ALT 20  --   --   AST 22  --   --   GLUCOSE 90  < > 53*  < > = values in this interval not displayed. No results found for: CHOL, HDL, LDLCALC, TRIG No results found for: DDIMER  Radiology/Studies  Dg Chest Port 1 View: 11/12/2015  CLINICAL DATA:  Tachycardia for 2 weeks, initial encounter EXAM: PORTABLE CHEST - 1 VIEW COMPARISON:  07/05/2014 FINDINGS: Cardiac shadow is enlarged. The lungs are well aerated bilaterally. No central vascular congestion is seen. No bony abnormality is noted. IMPRESSION: Cardiomegaly.  No other focal abnormality is seen. Electronically Signed    By: Alcide Clever M.D.   On: 11/12/2015 10:20    ECG: NSR with rate in 80's.   ECHOCARDIOGRAM:  - From Care Everywhere [facility] 11/06/2015) SUMMARY Left atrial appendage size was borderline dilated. No thrombus is detected in the left atrial appendage. There is mild spontaneous echo contrast in left atrial appendage. There is mild concentric left ventricular hypertrophy.  Left ventricular systolic function is moderately reduced. LV ejection fraction = 30-35%. The right ventricle is moderately dilated. The right ventricular systolic function is moderately reduced. Dilated left atrium Mild spontaneous echo contrast  in left atrial cavity. There is mild mitral annular calcification. Diffuse calcification of the aortic valve. There is aortic valve sclerosis. There is moderate tricuspid regurgitation. There is no pericardial effusion. Moderate atherosclerotic plaque(s) in the descending aorta. Mild calcified atherosclerotic plaque is seen in the distal ascending aorta Previous TEE dated 11/04/2015, did not have enough images for comparison.  FINDINGS: LEFT VENTRICLE There is mild concentric left ventricular hypertrophy. Left ventricular  systolic function is moderately reduced. LV ejection fraction = 30-35%.   ASSESSMENT AND PLAN  1. Paroxysmal Atrial Flutter - [facility]. Presented in atrial flutter on 11/12/2015 and successfully cardioverted in the ED. Has maintained NSR so far. - This patients CHA2DS2-VASc Score and unadjusted Ischemic Stroke Rate (% per year) is equal to 2.2 % stroke rate/year from a score of 2 (CHF, HTN). Continue anticoagulation with Coumadin. - Cannot find where [facility]. Will check today. - Was on Lopressor 50mg  BID PTA. Would restart Lopressor at 25mg  BID. - will start Amiodarone 400mg  BID for one week, then decrease to 400mg  daily. - will arrange outpatient PFT's.  2. Chronic Systolic CHF - EF recently  30-35% [facility]. Unknown if ischemic vs. Non-ischemic. - In Care Everywhere, has abnormal NST in 2012 with ST depression noted during exercise. Never had a follow-up catheterization. - Will restart BB. No ACE-I due to ESRD.  - Will need repeat outpatient NST and would repeat echo in 3 months to reassess his EF.  3. ESRD - on dialysis T, TH, SAT   Signed, Ellsworth LennoxBrittany M Strader, PA-C 11/13/2015, 3:46 PM Pager: [phone] Patient seen and examined and history reviewed. Agree with above findings and plan. 54 yo BM with history of ESRD on hemodialysis. [facility] with anemia requiring transfusion. Was noted to be in atrial flutter. After cleared by GI he underwent a TEE guided DCCV on 12/16. EF noted to be 30-35%. Discharged on coumadin and metoprolol. Yesterday non arrival to ED he was noted to be in atrial flutter again. INR 2.1 and he was cardioverted again. Only prior ischemic work up was a regular ETT in 2012 at [facility]. He has a history of HTN. He denies any symptoms of SOB, chest pain, palpitations even in Aflutter.  On exam lungs are clear. Trace edema. 2/6 systolic murmur at the apex.  Ecg shows NSR with no acute change.  Impression: 1. Atrial flutter. S/p DCCV x 2 within the last 10 days. Recurrent risk is high. Beta blocker limited by low BP. Recommend reducing metoprolol to 25 mg bid. Continue coumadin. AAD therapy limited to only amiodarone due to low EF and ESRD. Will start amiodarone 400 mg po bid x 1 week then 400 mg daily. Need to check baseline TFTs. Will arrange outpatient baseline PFTs. Will need close monitoring by cardiology as an outpatient with chemistries and monitoring of potential side effects.  2. Cardiomyopathy. Newly diagnosed. Differential includes nonischemic CM due to HTN versus tachycardia mediated CM. Also need to consider ischemia with multiple risk factors. Recommend a nuclear stress test as outpatient. If normal perfusion would repeat  Echo in 3 months after rhythm control. If stress test is abnormal may need cardiac cath. Explained above to patient. Needs close follow up. [place] and is agreeable to have all this done here. We will arrange.   Peter SwazilandJordan, MDFACC 11/13/2015 4:16 PM

## 2015-11-13 NOTE — Progress Notes (Signed)
Nutrition Brief Note  Patient identified on the Malnutrition Screening Tool (MST) Report. He reports usually weight 200 lbs, then down to 140's, now trending back up. Suspect some weight fluctuations related to fluid status. No weight loss noted over the past year. He reports that he follows a renal diet at home. He does not like the Nepro that is ordered for him and does not want any snacks. Nutrition focused physical exam completed.  No muscle or subcutaneous fat depletion noticed.  Wt Readings from Last 15 Encounters:  11/13/15 170 lb 3.1 oz (77.2 kg)  04/13/15 165 lb (74.844 kg)  09/19/14 162 lb 11.2 oz (73.8 kg)  07/05/14 171 lb (77.565 kg)  01/20/14 197 lb 8 oz (89.585 kg)  01/18/14 197 lb 6.4 oz (89.54 kg)  01/15/14 210 lb (95.255 kg)    Body mass index is 24.42 kg/(m^2). Patient meets criteria for normal weight based on current BMI.   Current diet order is Renal, patient is consuming approximately 50% of meals at this time. Labs and medications reviewed.   Patient declines nutrition intervention at this time, he states he is going home today after dialysis. Will d/c Nepro supplement. Please consult RD if needed.   Joaquin CourtsKimberly Shandora Koogler, RD, LDN, CNSC Pager [phone] After Hours Pager [phone]

## 2015-11-13 NOTE — Discharge Instructions (Signed)
We have made a couple changes to your medication: 1. Decrease your Metoprolol dose to  twice per day and take this tonight. 2. Start taking Amiodarone tonight.  Take  twice per day for 1 week, then take  daily after. 3. Take your regular dose of Coumadin  tonight, then start taking  daily now that you are on Amiodarone, too.  4. Go to your dialysis session tomorrow.   Your new prescriptions will be sent to your [facility] on [address].  Please go to your pharmacy tonight after leaving the hospital.  On Monday morning, call the Internal Medicine Center to schedule an appointment to have your INR checked to monitor your Coumadin dosage.  I will send them a message as well but will ask that you call, too.  Follow up with the cardiologist.  This is very important for you to do.  Atrial Fibrillation Atrial fibrillation is a type of heartbeat that is irregular or fast (rapid). If you have this condition, your heart keeps quivering in a weird (chaotic) way. This condition can make it so your heart cannot pump blood normally. Having this condition gives a person more risk for stroke, heart failure, and other heart problems. There are different types of atrial fibrillation. Talk with your doctor to learn about the type that you have. HOME CARE  Take over-the-counter and prescription medicines only as told by your doctor.  If your doctor prescribed a blood-thinning medicine, take it exactly as told. Taking too much of it can cause bleeding. If you do not take enough of it, you will not have the protection that you need against stroke and other problems.  Do not use any tobacco products. These include cigarettes, chewing tobacco, and e-cigarettes. If you need help quitting, ask your doctor.  If you have apnea (obstructive sleep apnea), manage it as told by your doctor.  Do not drink alcohol.  Do not drink beverages that have caffeine. These include coffee, soda, and  tea.  Maintain a healthy weight. Do not use diet pills unless your doctor says they are safe for you. Diet pills may make heart problems worse.  Follow diet instructions as told by your doctor.  Exercise regularly as told by your doctor.  Keep all follow-up visits as told by your doctor. This is important. GET HELP IF:  You notice a change in the speed, rhythm, or strength of your heartbeat.  You are taking a blood-thinning medicine and you notice more bruising.  You get tired more easily when you move or exercise. GET HELP RIGHT AWAY IF:  You have pain in your chest or your belly (abdomen).  You have sweating or weakness.  You feel sick to your stomach (nauseous).  You notice blood in your throw up (vomit), poop (stool), or pee (urine).  You are short of breath.  You suddenly have swollen feet and ankles.  You feel dizzy.  Your suddenly get weak or numb in your face, arms, or legs, especially if it happens on one side of your body.  You have trouble talking, trouble understanding, or both.  Your face or your eyelid droops on one side. These symptoms may be an emergency. Do not wait to see if the symptoms will go away. Get medical help right away. Call your local emergency services (911 in the [place] Do not drive yourself to the hospital.   This information is not intended to replace advice given to you by your health care provider. Make  sure you discuss any questions you have with your health care provider.  Continue current medications. Call cardiology for follow-up. Return for any new or worse symptoms. Arrange to have dialysis done tomorrow.   Document Released: 08/16/2008 Document Revised: 07/29/2015 Document Reviewed: 03/04/2015 Elsevier Interactive Patient Education [employer]2016 Elsevier Inc.

## 2015-11-13 NOTE — Discharge Summary (Signed)
Name: Samuel Rowland MRN: [id number] DOB: 08/30/1961 54 y.o. PCP: No Pcp Per Patient  Date of Admission: 11/12/2015  9:11 AM Date of Discharge: 11/13/2015 Attending Physician: No att. providers found  Discharge Diagnosis: Principal Problem:   Paroxysmal atrial fibrillation with RVR (HCC) Active Problems:   Renal transplant recipient   Essential hypertension   Anemia of chronic kidney failure  Discharge Medications:   Medication List    TAKE these medications        amiodarone 400 MG tablet  Commonly known as:  PACERONE  Take 1 tablet (400 mg total) by mouth 2 (two) times daily for 1 week, then take once daily after     cinacalcet 30 MG tablet  Commonly known as:  SENSIPAR  Take 30 mg by mouth daily.     iron polysaccharides 150 MG capsule  Commonly known as:  NIFEREX  Take 150 mg by mouth 2 (two) times daily.     metoprolol tartrate 25 MG tablet  Commonly known as:  LOPRESSOR  Take 1 tablet (25 mg total) by mouth 2 (two) times daily.     multivitamin Tabs tablet  Take 1 tablet by mouth at bedtime.     mycophenolate 180 MG EC tablet  Commonly known as:  MYFORTIC  Take 720 mg by mouth 2 (two) times daily.     predniSONE 5 MG tablet  Commonly known as:  DELTASONE  Take 15 mg by mouth daily with breakfast.     ranitidine 150 MG tablet  Commonly known as:  ZANTAC  Take 150 mg by mouth daily as needed for heartburn.     RENVELA 2.4 G Pack  Generic drug:  sevelamer carbonate  Take 2.4 g by mouth 3 (three) times daily with meals.     sulfamethoxazole-trimethoprim 400-80 MG tablet  Commonly known as:  BACTRIM,SEPTRA  Take 1 tablet by mouth 3 (three) times a week.     warfarin 2 MG tablet  Commonly known as:  COUMADIN  Take 2 mg by mouth daily.        Disposition and follow-up:   SamuelKalel Benancio Rowland was discharged [facility] in Stable condition.    1.  At the hospital follow up visit please address:  - patients follow up with  cardiology.  He is supposed to schedule follow up with Dr. Elvis CoilJordan's office - medications: at discharge he was started on amiodarone 400mg  BID x 1 week, then daily after 1 week.  His metoprolol was decreased to 25mg  BID.  His Coumadin was continued but at a dose adjustment based on starting amiodarone.  Patient will likely need INR monitoring and Coumadin adjustment.  2.  Labs / imaging needed at time of follow-up: PT/INR  3.  Pending labs/ test needing follow-up: blood cultures  Follow-up Appointments: Follow-up Information    Schedule an appointment as soon as possible for a visit with Peter SwazilandJordan, MD.   Specialty:  Cardiology   Why:  The office will call you within one week to schedule an appointment. If you do not hear from them in 1 week, please call the number provided.   Contact information:   [address] [place] [id number] [phone]       Call [facility].   Why:  call on Monday morning first thing and tell them you need to have INR checked   Contact information:   [address] [place] [id number] [phone]  Discharge Instructions:   Consultations: Treatment Team:  Annie Sable, MD [facility], MD  Procedures Performed:  Dg Chest Port 1 View  11/12/2015  CLINICAL DATA:  Tachycardia for 2 weeks, initial encounter EXAM: PORTABLE CHEST - 1 VIEW COMPARISON:  07/05/2014 FINDINGS: Cardiac shadow is enlarged. The lungs are well aerated bilaterally. No central vascular congestion is seen. No bony abnormality is noted. IMPRESSION: Cardiomegaly.  No other focal abnormality is seen. Electronically Signed   By: Alcide Clever M.D.   On: 11/12/2015 10:20    2D Echo: none  Cardiac Cath: none  Admission HPI:  Samuel Rowland is a 54yo M with PMH of ESRD on TTS HD via LUE AVF 2/2 HTN s/p failed DDKT in 2010, HFrEF (EF 30-35% via TTE in 08/2015), GERD, and newly-diagnosed atrial fibrillation on coumadin  discharged 3 days ago from [facility] for cardioversion who presents with hypotension after cardioversion in the ED for Afib.   He was found to be back in Afib this morning, at his dialysis center before starting dialysis and transferred to the ED. He did not have any symptoms at that time, his maximum HR was 135, and his BP was normal. He was cardioverted and developed lightheadedness, dizzyness, and hypotension down to the 80s/60s soon thereafter. He was started on IV fluids and his pressures have slowly responded, now 105/77 at bedside. Currently, he says he lightheadedness is still present but improving. His only other current complaint is a nonproductive cough that started this morning without rhinorrhea, sore throat, or sick contacts.   He denies headache, fevers, syncope, vision changes, chest pain, palpitations, shortness of breath, nausea/vomiting, abdominal pain, weakness, numbness, changes in urination or bowel movements, or other new symptoms at this time. He does say for the past several months, he gets occasional 'flushing' episodes that last a few minutes, particularly at night, that resolve spontaneously. He also feels 'bloated' with abdominal swelling in between dialysis sessions. He has had some generalized malaise for the past ~1 year attributed to his chronic DDKT rejection for which he is taking prednisone and mycophenolate. He denies other symptoms at this time.  Hospital Course by problem list: Principal Problem:   Paroxysmal atrial fibrillation with RVR (HCC) Active Problems:   Renal transplant recipient   Essential hypertension   Anemia of chronic kidney failure   Paroxysmal Atrial Fibrillation with RVR Status Post Cardioversion: patient is status post DCCV x 2 in the last 10 days and, therefore, has high recurrence risk.  After cardioversion in our emergency department, patient remained in sinus rhythm and was hemodynamically stable after receiving gentle IV fluid  rehydration.  We consulted Cardiology for medication recommendations on this patient and they recommended starting amiodarone  BID x 1 week and then daily after.  They also recommended reducing Metoprolol  BID.  His Coumadin was continued as well, however, after consultation with inpatient pharmacy, his home dose of  daily was decreased by 50% to  daily.  He will be followed up as an outpatient by cardiology who will arrange obtaining baseline PFTs, chemistry monitoring.  He was just recently started on Coumadin after hospitalization at [facility] and has no follow up in place for this so I placed future lab orders for PT/INR check and have asked patient to contact [facility] for INR follow up.  I have also asked our clinic front desk staff to contact patient for appointment to follow up in our clinic as he as no PCP.    HFrEF: This was  recently diagnosed at [facility] in 08/2015. EF 30-35%.  Differential includes non-ischemic cardiomyopathy due to hypertension versus tachycardia mediated cardiomyopathy.  Also has multiple risk factors for ischemia.  Cardiology recommending a nuclear stress test as outpatient and if normal perfusion, would repeat Echo in 3 months after rhythm control.  If stress is abnormal, patient may need cardiac catheterization.  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 Did not receive dialysis on 12/22 because of heart rate but did get HD as an inpatient on 12/23.  Discharged with instructions to go to HD as scheduled on 12/24.  Discharge Vitals:   BP 138/72 mmHg  Pulse 72  Temp(Src) 98.2 F (36.8 C) (Oral)  Resp 18  Ht  (1.778 m)  Wt 170 lb 3.1 oz (77.2 kg)  BMI 24.42 kg/m2  SpO2 100%  Discharge Labs:  No results found for this or any previous visit (from the past 24 hour(s)).  Signed: [name], DO 11/15/2015, 1:20 PM    Services Ordered on Discharge:  none Equipment Ordered on Discharge: none

## 2015-11-13 NOTE — Progress Notes (Signed)
Subjective: Patient seen and examined this morning on rounds.  No acute events overnight since admission.  No complaints of lightheadedness, shortness of breath, chest pain.  Objective: Vital signs in last 24 hours: Filed Vitals:   11/12/15 1723 11/12/15 1729 11/12/15 2031 11/13/15 0621  BP: 96/67  95/66 120/79  Pulse: 76  84 83  Temp: 99.4 F (37.4 C)  98.8 F (37.1 C) 98.1 F (36.7 C)  TempSrc: Oral  Oral Oral  Resp: 18     Height: 5\' 10"  (1.778 m) 5\' 10"  (1.778 m)    Weight:  171 lb 4.8 oz (77.701 kg)  169 lb 14.4 oz (77.066 kg)  SpO2: 98%  100% 100%   Weight change:   Intake/Output Summary (Last 24 hours) at 11/13/15 1112 Last data filed at 11/13/15 0500  Gross per 24 hour  Intake    480 ml  Output      0 ml  Net    480 ml   General: resting in bed, no distress HEENT: EOMI, no scleral icterus Cardiac: RRR, no rubs, murmurs or gallops Pulm: clear to auscultation bilaterally, moving normal volumes of air Abd: soft, nontender, nondistended, BS present Ext: warm and well perfused, no pedal edema Neuro: alert and oriented X3, no focal deficits  Lab Results: Basic Metabolic Panel:  Recent Labs Lab 11/12/15 0930 11/12/15 0953 11/13/15 0506  NA 139 137 138  K 4.7 4.4 5.9*  CL 97* 97* 98*  CO2 24  --  21*  GLUCOSE 90 86 53*  BUN 47* 54* 57*  CREATININE 8.11* 8.00* 9.43*  CALCIUM 7.4*  --  7.8*  PHOS  --   --  7.3*   Liver Function Tests:  Recent Labs Lab 11/12/15 0930 11/13/15 0506  AST 22  --   ALT 20  --   ALKPHOS 174*  --   BILITOT 0.6  --   PROT 7.8  --   ALBUMIN 3.0* 2.8*   No results for input(s): LIPASE, AMYLASE in the last 168 hours. No results for input(s): AMMONIA in the last 168 hours. CBC:  Recent Labs Lab 11/12/15 0930 11/12/15 0953  WBC 15.0*  --   NEUTROABS 11.3*  --   HGB 8.0* 10.5*  HCT 26.2* 31.0*  MCV 90.7  --   PLT 243  --    Cardiac Enzymes: No results for input(s): CKTOTAL, CKMB, CKMBINDEX, TROPONINI in the last  168 hours. BNP: No results for input(s): PROBNP in the last 168 hours. D-Dimer: No results for input(s): DDIMER in the last 168 hours. CBG: No results for input(s): GLUCAP in the last 168 hours. Hemoglobin A1C: No results for input(s): HGBA1C in the last 168 hours. Fasting Lipid Panel: No results for input(s): CHOL, HDL, LDLCALC, TRIG, CHOLHDL, LDLDIRECT in the last 168 hours. Thyroid Function Tests: No results for input(s): TSH, T4TOTAL, FREET4, T3FREE, THYROIDAB in the last 168 hours. Coagulation:  Recent Labs Lab 11/12/15 0930 11/13/15 0506  LABPROT 23.5* 25.2*  INR 2.11* 2.32*   Anemia Panel: No results for input(s): VITAMINB12, FOLATE, FERRITIN, TIBC, IRON, RETICCTPCT in the last 168 hours. Urine Drug Screen: Drugs of Abuse  No results found for: LABOPIA, COCAINSCRNUR, LABBENZ, AMPHETMU, THCU, LABBARB  Alcohol Level: No results for input(s): ETH in the last 168 hours. Urinalysis: No results for input(s): COLORURINE, LABSPEC, PHURINE, GLUCOSEU, HGBUR, BILIRUBINUR, KETONESUR, PROTEINUR, UROBILINOGEN, NITRITE, LEUKOCYTESUR in the last 168 hours.  Invalid input(s): APPERANCEUR   Micro Results: Recent Results (from the past 240 hour(s))  Culture, blood (Routine X 2) w Reflex to ID Panel     Status: None (Preliminary result)   Collection Time: 11/12/15  2:10 PM  Result Value Ref Range Status   Specimen Description BLOOD RIGHT ANTECUBITAL  Final   Special Requests BOTTLES DRAWN AEROBIC ONLY 10CC  Final   Culture NO GROWTH < 24 HOURS  Final   Report Status PENDING  Incomplete  Culture, blood (Routine X 2) w Reflex to ID Panel     Status: None (Preliminary result)   Collection Time: 11/12/15  2:15 PM  Result Value Ref Range Status   Specimen Description BLOOD RIGHT HAND  Final   Special Requests   Final    BOTTLES DRAWN AEROBIC AND ANAEROBIC 8CC AER 5CC ANA   Culture NO GROWTH < 24 HOURS  Final   Report Status PENDING  Incomplete   Studies/Results: Dg Chest Port 1  View  11/12/2015  CLINICAL DATA:  Tachycardia for 2 weeks, initial encounter EXAM: PORTABLE CHEST - 1 VIEW COMPARISON:  07/05/2014 FINDINGS: Cardiac shadow is enlarged. The lungs are well aerated bilaterally. No central vascular congestion is seen. No bony abnormality is noted. IMPRESSION: Cardiomegaly.  No other focal abnormality is seen. Electronically Signed   By: Alcide Clever M.D.   On: 11/12/2015 10:20   Medications:  Scheduled Meds: . cinacalcet  30 mg Oral Q breakfast  . feeding supplement (NEPRO CARB STEADY)  237 mL Oral BID BM  . iron polysaccharides  150 mg Oral BID  . pantoprazole  40 mg Oral BID  . predniSONE  15 mg Oral Q breakfast  . sevelamer carbonate  2.4 g Oral TID WC  . warfarin  2 mg Oral Daily  . Warfarin - Physician Dosing Inpatient   Does not apply q1800   Continuous Infusions: . sodium chloride 10 mL/hr at 11/12/15 0943   PRN Meds:.acetaminophen **OR** acetaminophen, albuterol, famotidine, promethazine **OR** promethazine **OR** promethazine, sodium chloride Assessment/Plan: Principal Problem:   Paroxysmal atrial fibrillation with RVR (HCC) Active Problems:   Renal transplant recipient   Essential hypertension   Anemia of chronic kidney failure  Paroxysmal Atrial Fibrillation with RVR Status Post Cardioversion  - patient continues in sinus rhythm without complaints today and hemodynamically stable.  We will continue gentle IV fluid boluses as needed for SBP < 90 - continue to follow up blood cultures, lactic acid, and WBC.  Suspect this to be elevated in light of recent cardioversion and hypotension - continuing home Warfarin - holding home metoprolol - consult to cardiology for medication recommendations, appreciate their assistance - possible discharge today pending cardiology recs  HFrEF - recently diagnosed at [facility] in 08/2015. 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 Did not receive dialysis on 12/22 because of heart rate. - continue home prednisone - consult nephrology, appreciate their assistance - continue home Sensipar  History of GI bleed - during hospitalization 1 week ago at [facility], 1 black bowel movement while on heparin, EGD showed esophagitis without obvious causes, started on PPI BID. - continue pantoprazole BID  DVT PPx: Wafarin  Diet: Renal   Dispo: possible discharge home today pending cardiology recs and nephrology recs  The patient does not have a current PCP (No Pcp Per Patient) and does need an [facility] hospital follow-up appointment after discharge.  The patient does not have transportation  limitations that hinder transportation to clinic appointments.  .Services Needed at time of discharge: Y = Yes, Blank = No PT:   OT:   RN:   Equipment:   Other:       [name], DO 11/13/2015, 11:12 AM

## 2015-11-13 NOTE — Care Management Obs Status (Signed)
MEDICARE OBSERVATION STATUS NOTIFICATION   Patient Details  Name: Samuel Rowland MRN: [id number] Date of Birth: 12/19/1960   Medicare Observation Status Notification Given:  Yes    Gala LewandowskyGraves-Bigelow, Kavina Cantave Kaye, RN 11/13/2015, 4:55 PM

## 2015-11-15 ENCOUNTER — Other Ambulatory Visit: Payer: Self-pay | Admitting: Internal Medicine

## 2015-11-15 DIAGNOSIS — I480 Paroxysmal atrial fibrillation: Secondary | ICD-10-CM

## 2015-11-16 ENCOUNTER — Telehealth: Payer: Self-pay | Admitting: Internal Medicine

## 2015-11-16 NOTE — Telephone Encounter (Signed)
  Reason for call:   I placed an outgoing call to Mr. Samuel Rowland at 1:56  PM regarding his discharge instructions to call our Internal Medicine Clinic today for a hospital follow up appointment and orders to check his INR as well.  I wanted to inform patient that our clinic was actually closed today, but that he should call tomorrow (Tuesday) to follow up. He informed that he had not called yet. I also informed patient that a message to contact him had been routed to our clinic to set up follow up.   Assessment/ Plan:   Patient informed me during conversation that he had not yet picked up his prescriptions for Amiodarone or Metoprolol that had been sent in for him at discharge on 12/23, but he had decreased his dose of Warfarin based on instruction as if he were taking Amiodarone.  He said he was picking up Amiodarone today.  On date of discharge, I contacted patients pharmacy to ensure they would be open late enough for patient to pick up prescription.  They were open until 9pm and patient discharged around 6pm.  He assured me he would be able to get to pharmacy before close. I am unsure of why this did not happen.  Patient instructed to pick up Amiodarone and Metoprolol as previously instructed, continue Warfarin as instructed, and to call [facility].  Patient states that he has dialysis tomorrow and they are able to check his INR there.  I asked patient to bring copy of this to his hospital follow up that he schedules.  As always, pt is advised that if symptoms worsen or new symptoms arise, they should go to an urgent care facility or to to ER for further evaluation.   Gwynn BurlyAndrew Juanmiguel Defelice, DO   11/16/2015, 1:56 PM

## 2015-11-17 ENCOUNTER — Other Ambulatory Visit: Payer: Self-pay | Admitting: Student

## 2015-11-17 ENCOUNTER — Ambulatory Visit (INDEPENDENT_AMBULATORY_CARE_PROVIDER_SITE_OTHER): Payer: Medicare Other | Admitting: Pharmacist

## 2015-11-17 ENCOUNTER — Telehealth: Payer: Self-pay | Admitting: Cardiology

## 2015-11-17 DIAGNOSIS — I480 Paroxysmal atrial fibrillation: Secondary | ICD-10-CM | POA: Diagnosis present

## 2015-11-17 DIAGNOSIS — Z7901 Long term (current) use of anticoagulants: Secondary | ICD-10-CM | POA: Diagnosis not present

## 2015-11-17 DIAGNOSIS — I4891 Unspecified atrial fibrillation: Secondary | ICD-10-CM | POA: Insufficient documentation

## 2015-11-17 DIAGNOSIS — Z79899 Other long term (current) drug therapy: Secondary | ICD-10-CM

## 2015-11-17 LAB — CULTURE, BLOOD (ROUTINE X 2)
CULTURE: NO GROWTH
Culture: NO GROWTH

## 2015-11-17 NOTE — Telephone Encounter (Signed)
Patient contacted regarding discharge from [facility] on 11/12/2016.  Patient understands to follow up with provider Wilburt FinlayBryan Hager on 11/26/2015 at 03:30 pm at [facility]. Patient understands discharge instructions? Yes Patient understands medications and regiment? Yes Patient understands to bring all medications to this visit? Yes

## 2015-11-17 NOTE — Patient Instructions (Signed)
Patient educated about medication as defined in this encounter and verbalized understanding by repeating back instructions provided.   

## 2015-11-17 NOTE — Telephone Encounter (Signed)
TCM Phone Call. appt is on 11/26/15 at 10:30am w/ [facility]Bryan [facility]   Thanks

## 2015-11-17 NOTE — Progress Notes (Signed)
Anticoagulation Management Samuel Rowland is a 54 y.o. male is on warfarin therapy.    Indication: atrial fibrillation Duration: indefinite  Anticoagulation Clinic Visit History: Patient does not report signs/symptoms of bleeding or thromboembolism Other recent changes: none reported Anticoagulation Episode Summary    Current INR goal 2.0-3.0  Next INR check 11/19/2015  INR from last check 2.32 (11/13/2015)  Weekly max dose   Target end date Indefinite  INR check location   Preferred lab   Send INR reminders to    Indications  Paroxysmal atrial fibrillation with RVR (HCC) [I48.0] Atrial fibrillation (HCC) [I48.91] [I48.91] Long term (current) use of anticoagulants [Z79.01] [Z79.01]        Comments        ASSESSMENT Recent Results: Recent results are below, the most recent result is correlated with a dose of 14 mg per week: Lab Results  Component Value Date   INR 2.32* 11/13/2015   INR 2.11* 11/12/2015   INR 1.41 10/26/2009   INR today: Therapeutic  Anticoagulation Dosing: INR as of 11/17/2015 and Previous Dosing Information    INR Dt INR Goal [facility] Tot Sun Mon Tue Wed Thu Fri Sat   11/13/2015 2.32 - 14 mg 2 mg 2 mg 2 mg 2 mg 2 mg 2 mg 2 mg   Patient deviated from recommended dosing.       Anticoagulation Dose Instructions as of 11/17/2015      Total Sun Mon Tue Wed Thu Fri Sat   New Dose 7 mg 1 mg 1 mg 1 mg 1 mg 1 mg 1 mg 1 mg     (2 mg x 0.5)  (2 mg x 0.5)  (2 mg x 0.5)  (2 mg x 0.5)  (2 mg x 0.5)  (2 mg x 0.5)  (2 mg x 0.5)                         Description        Reduced dose by 50% due to initiation of amiodarone therapy tomorrow      PLAN Weekly dose was decreased by 50% to 7 mg per week to adjust for drug interaction with amiodarone which patient will start taking tomorrow.  Patient Instructions  Patient educated about medication as defined in this encounter and verbalized understanding by repeating back instructions provided.    Follow-up Return in about 2 days (around 11/19/2015).  Kim,Jennifer J

## 2015-11-21 NOTE — Progress Notes (Signed)
Regarding Samuel Rowland's paroxysmal atrial fibrillation, his CHADS-VASc score is a 3, for CHF, HTN, and vascular history.

## 2015-11-26 ENCOUNTER — Encounter: Payer: Self-pay | Admitting: Physician Assistant

## 2015-11-26 ENCOUNTER — Ambulatory Visit (INDEPENDENT_AMBULATORY_CARE_PROVIDER_SITE_OTHER): Payer: Medicare Other | Admitting: Physician Assistant

## 2015-11-26 ENCOUNTER — Ambulatory Visit (INDEPENDENT_AMBULATORY_CARE_PROVIDER_SITE_OTHER): Payer: Medicare Other | Admitting: Pharmacist Clinician (PhC)/ Clinical Pharmacy Specialist

## 2015-11-26 ENCOUNTER — Ambulatory Visit: Payer: Medicare Other | Admitting: Physician Assistant

## 2015-11-26 VITALS — BP 152/88 | HR 80 | Ht 72.0 in | Wt 161.0 lb

## 2015-11-26 DIAGNOSIS — I480 Paroxysmal atrial fibrillation: Secondary | ICD-10-CM

## 2015-11-26 DIAGNOSIS — Z7901 Long term (current) use of anticoagulants: Secondary | ICD-10-CM | POA: Diagnosis not present

## 2015-11-26 DIAGNOSIS — I10 Essential (primary) hypertension: Secondary | ICD-10-CM | POA: Diagnosis not present

## 2015-11-26 DIAGNOSIS — I422 Other hypertrophic cardiomyopathy: Secondary | ICD-10-CM

## 2015-11-26 LAB — TSH: TSH: 0.835 u[IU]/mL (ref 0.350–4.500)

## 2015-11-26 LAB — POCT INR: INR: 1.3

## 2015-11-26 NOTE — Progress Notes (Signed)
Patient ID: Samuel Rowland, male   DOB: December 11, 1960, 55 y.o.   MRN: [id number]    Date:  11/26/2015   ID:  Samuel Rowland, DOB 09/09/1961, MRN [id number]  PCP:  No PCP Per Patient  Primary Cardiologist:  Jordan-New  Chief Complaint  Patient presents with  . Follow-up    NO COMPLAINTS.     History of Present Illness: Samuel Rowland is a 55 y.o. male with past medical history of HTN, PAF (on Coumadin), chronic systolic CHF (EF 78-29%) and ESRD (dialysis TTS) who presented to [facility] ED on 11/12/2015 for tachycardia.   He had been at dialysis earlier that day and was found to be tachycardiac into the 130's. He was noted to be in atrial flutter upon arrival to the ED. He was cardioverted with 120 J and had successful return to NSR. He was noted to be lightheaded and hypotensive following the procedure. He also had a WBC count elevated to [address]000 and was admitted for further evaluation.   He has maintained NSR while admitted with rates in the 70's -80's. He does have frequent PVC's on telemetry. His Metoprolol was held secondary to hypotension. He denied any chest pain, palpitations, dyspnea, orthopnea, PND, or lower extremity edema. Reports feeling well at this time.  He was recently admitted from 11/03/2015 to 11/09/2015 at [facility] for new onset atrial flutter and underwent successful cardioversion on 11/06/2015 with return to NSR. He was started on Coumadin for anticoagulation and Lopressor 50mg  BID for rate control. He had an echocardiogram that admission which showed an EF of 30-35% and TEE showed borderline dilation of the LAA without thrombus. He reports having never seen a Cardiologist before that admission. Has never had a cardiac catheterization.  Lopressor was restarted 25 mg twice daily and he was started on amiodarone 400 mg twice a day for a week and then instructed to decrease to 400 mg daily thereafter.  He is not on ACE inhibitor or ARB due to end-stage renal disease.  Was also  recommended he undergo nuclear stress testing.  CHADSVASC 2 and he is on coumadin.    Patient is here for posthospital follow-up.reports doing well hours had some change to his taste buds and has had some recent night sweats. Is taking all his medications and is directly and switched his amiodarone to once daily as directed.    The patient is unaware of any family history of CAD.         The patient currently denies nausea, vomiting, fever, chest pain, shortness of breath, orthopnea, dizziness, PND, cough, congestion, abdominal pain, hematochezia, melena, lower extremity edema, claudication.  Wt Readings from Last 3 Encounters:  11/26/15 161 lb (73.029 kg)  11/13/15 170 lb 3.1 oz (77.2 kg)  04/13/15 165 lb (74.844 kg)     Past Medical History  Diagnosis Date  . Kidney transplant as cause of abnormal reaction or later complication   . Hypertension   . Nausea and vomiting     chronic  . Anemia   . Atrial fibrillation (HCC)   . Heart murmur   . Paroxysmal atrial fibrillation with RVR (HCC)   . GERD (gastroesophageal reflux disease)   . Renal disorder     S/P right transplant  . ESRD (end stage renal disease) on dialysis [facility])     "[facility]; TTS since ~10/2014" (11/12/2015)    Current Outpatient Prescriptions  Medication Sig Dispense Refill  . amiodarone (PACERONE) 400 MG tablet Take 1 tablet (400 mg  total) by mouth 2 (two) times daily for 1 week, then take once daily after 60 tablet 0  . cinacalcet (SENSIPAR) 30 MG tablet Take 30 mg by mouth daily.    . iron polysaccharides (NIFEREX) 150 MG capsule Take 150 mg by mouth 2 (two) times daily.    . metoprolol tartrate (LOPRESSOR) 25 MG tablet Take 1 tablet (25 mg total) by mouth 2 (two) times daily. 60 tablet 0  . multivitamin (RENA-VIT) TABS tablet Take 1 tablet by mouth at bedtime. 30 tablet 0  . predniSONE (DELTASONE) 5 MG tablet Take 15 mg by mouth daily with breakfast.     . ranitidine (ZANTAC) 150 MG tablet Take  150 mg by mouth daily as needed for heartburn.    . sevelamer carbonate (RENVELA) 2.4 G PACK Take 2.4 g by mouth 3 (three) times daily with meals.    Marland Kitchen. sulfamethoxazole-trimethoprim (BACTRIM,SEPTRA) 400-80 MG tablet Take 1 tablet by mouth 3 (three) times a week.    . warfarin (COUMADIN) 2 MG tablet Take 2 mg by mouth daily.     No current facility-administered medications for this visit.    Allergies:   No Known Allergies  Social History:  The patient  reports that he has quit smoking. His smoking use included Cigars. He has never used smokeless tobacco. He reports that he does not drink alcohol or use illicit drugs.   Family history:   Family History  Problem Relation Age of Onset  . Hypertension Father     ROS:  Please see the history of present illness.  All other systems reviewed and negative.   PHYSICAL EXAM: VS:  BP 152/88 mmHg  Pulse 80  Ht 6' (1.829 m)  Wt 161 lb (73.029 kg)  BMI 21.83 kg/m2 Well nourished, well developed, in no acute distress HEENT: Pupils are equal round react to light accommodation extraocular movements are intact.  Neck: no JVDNo cervical lymphadenopathy. Cardiac: Regular rate and rhythm with 3/6 systolic murmur throughout Lungs:  clear to auscultation bilaterally, no wheezing, rhonchi or rales Abd: firm and tender on the right side ever since kidney transplant, positive bowel sounds all quadrants,  Ext: no lower extremity edema.  2+ radial and dorsalis pedis pulses. Skin: warm and dry Neuro:  Grossly normal  EKG: sinus rhythm rate 80 bpm with PACs,  Left atrial enlargement  ASSESSMENT AND PLAN:  Problem List Items Addressed This Visit    Other hypertrophic cardiomyopathy (HCC)   Relevant Orders   Myocardial Perfusion Imaging   Long term (current) use of anticoagulants [Z79.01]   Essential hypertension   Atrial fibrillation (HCC) [I48.91] - Primary   Relevant Orders   EKG 12-Lead   TSH   Myocardial Perfusion Imaging     Patient is  maintaining sinus rhythm with frequent PACs. He is taking the amiodarone 400 mg daily, metoprolol 25 mg twice daily and Coumadin as directed. Unfortunately his INR is only 1.3. our pharmacist started him to take 2 tablets today and then he will follow-up with the person who is following his INR tomorrow normally. We did send them an email. he does appear euvolemic.  Volume is managed through dialysis. We never did get a TSH while he was hospitalized so we'll check that today. We will also check a treadmill Myoview to see if we can determine the type of cardiomyopathy he has.  He also has a significant cardiac murmur there don't see documentation as to source. We should consider redoing his echocardiogram in the future.  Will review Myoview results first. He has had problems with hypotension recently so I will not make any changes to his medications for that.

## 2015-11-26 NOTE — Patient Instructions (Addendum)
Your physician has recommended you make the following change in your medication: TAKE WARFARIN (COUMADIN) 2 MG TABLETS - TAKE 2 TONIGHT THEN TAKE ONE TABLET DAILY.   Your physician has requested that you have an exercise stress myoview. For further information please visit [email or web address]. Please follow instruction sheet, as given.  Your physician recommends that you return for lab work in: today at [facility] lab first floor.  Your physician recommends that you schedule a follow-up appointment in: one month with Dr. SwazilandJordan.  If you need a refill on your cardiac medications before your next appointment, please call your pharmacy.

## 2015-11-30 LAB — PROTHROMBIN TIME + INR: INR: 2.37

## 2015-12-01 LAB — PROTIME-INR: INR: 1.4 — AB (ref 0.9–1.1)

## 2015-12-02 ENCOUNTER — Inpatient Hospital Stay (HOSPITAL_COMMUNITY): Admission: RE | Admit: 2015-12-02 | Payer: Medicare Other | Source: Ambulatory Visit

## 2015-12-03 ENCOUNTER — Ambulatory Visit (INDEPENDENT_AMBULATORY_CARE_PROVIDER_SITE_OTHER): Payer: Medicare Other | Admitting: Pharmacist Clinician (PhC)/ Clinical Pharmacy Specialist

## 2015-12-03 DIAGNOSIS — I480 Paroxysmal atrial fibrillation: Secondary | ICD-10-CM

## 2015-12-03 DIAGNOSIS — Z7901 Long term (current) use of anticoagulants: Secondary | ICD-10-CM

## 2015-12-04 ENCOUNTER — Telehealth (HOSPITAL_COMMUNITY): Payer: Self-pay

## 2015-12-04 NOTE — Telephone Encounter (Signed)
Encounter complete. 

## 2015-12-08 ENCOUNTER — Telehealth (HOSPITAL_COMMUNITY): Payer: Self-pay

## 2015-12-08 NOTE — Telephone Encounter (Signed)
Encounter complete. 

## 2015-12-09 ENCOUNTER — Ambulatory Visit (HOSPITAL_COMMUNITY)
Admission: RE | Admit: 2015-12-09 | Discharge: 2015-12-09 | Disposition: A | Discharge status: Home / Self Care | Payer: Medicare Other | Source: Ambulatory Visit | Attending: Cardiovascular Disease | Admitting: Cardiovascular Disease

## 2015-12-09 DIAGNOSIS — I480 Paroxysmal atrial fibrillation: Secondary | ICD-10-CM | POA: Insufficient documentation

## 2015-12-09 DIAGNOSIS — I517 Cardiomegaly: Secondary | ICD-10-CM | POA: Diagnosis not present

## 2015-12-09 DIAGNOSIS — R42 Dizziness and giddiness: Secondary | ICD-10-CM | POA: Diagnosis not present

## 2015-12-09 DIAGNOSIS — Z87891 Personal history of nicotine dependence: Secondary | ICD-10-CM | POA: Diagnosis not present

## 2015-12-09 DIAGNOSIS — I422 Other hypertrophic cardiomyopathy: Secondary | ICD-10-CM

## 2015-12-09 DIAGNOSIS — I10 Essential (primary) hypertension: Secondary | ICD-10-CM | POA: Insufficient documentation

## 2015-12-09 DIAGNOSIS — R9439 Abnormal result of other cardiovascular function study: Secondary | ICD-10-CM | POA: Diagnosis not present

## 2015-12-09 LAB — MYOCARDIAL PERFUSION IMAGING
CHL CUP NUCLEAR SDS: 1
CHL CUP RESTING HR STRESS: 59 {beats}/min
LVDIAVOL: 205 mL
LVSYSVOL: 121 mL
Peak HR: 69 {beats}/min
SRS: 1
SSS: 2
TID: 1.12

## 2015-12-09 MED ORDER — REGADENOSON 0.4 MG/5ML IV SOLN
0.4000 mg | Freq: Once | INTRAVENOUS | Status: AC
Start: 2015-12-09 — End: 2015-12-09
  Administered 2015-12-09: 0.4 mg via INTRAVENOUS

## 2015-12-09 MED ORDER — TECHNETIUM TC 99M SESTAMIBI GENERIC - CARDIOLITE
10.1000 | Freq: Once | INTRAVENOUS | Status: AC | PRN
  Administered 2015-12-09: 10.1 via INTRAVENOUS

## 2015-12-09 MED ORDER — TECHNETIUM TC 99M SESTAMIBI GENERIC - CARDIOLITE
31.8000 | Freq: Once | INTRAVENOUS | Status: AC | PRN
  Administered 2015-12-09: 31.8 via INTRAVENOUS

## 2015-12-15 ENCOUNTER — Encounter: Payer: Self-pay | Admitting: Cardiology

## 2015-12-15 ENCOUNTER — Ambulatory Visit (INDEPENDENT_AMBULATORY_CARE_PROVIDER_SITE_OTHER): Payer: Medicare Other | Admitting: Cardiology

## 2015-12-15 ENCOUNTER — Other Ambulatory Visit: Payer: Self-pay | Admitting: Cardiology

## 2015-12-15 ENCOUNTER — Ambulatory Visit (INDEPENDENT_AMBULATORY_CARE_PROVIDER_SITE_OTHER): Payer: Medicare Other | Admitting: Pharmacist Clinician (PhC)/ Clinical Pharmacy Specialist

## 2015-12-15 VITALS — BP 144/72 | HR 72 | Ht 70.0 in | Wt 163.5 lb

## 2015-12-15 DIAGNOSIS — I10 Essential (primary) hypertension: Secondary | ICD-10-CM | POA: Diagnosis not present

## 2015-12-15 DIAGNOSIS — I480 Paroxysmal atrial fibrillation: Secondary | ICD-10-CM

## 2015-12-15 DIAGNOSIS — Z992 Dependence on renal dialysis: Secondary | ICD-10-CM

## 2015-12-15 DIAGNOSIS — Z7901 Long term (current) use of anticoagulants: Secondary | ICD-10-CM

## 2015-12-15 DIAGNOSIS — R9439 Abnormal result of other cardiovascular function study: Secondary | ICD-10-CM

## 2015-12-15 DIAGNOSIS — R931 Abnormal findings on diagnostic imaging of heart and coronary circulation: Secondary | ICD-10-CM | POA: Diagnosis not present

## 2015-12-15 DIAGNOSIS — N186 End stage renal disease: Secondary | ICD-10-CM | POA: Insufficient documentation

## 2015-12-15 LAB — POCT INR: INR: 7.6

## 2015-12-15 MED ORDER — AMIODARONE HCL 400 MG PO TABS
400.0000 mg | ORAL_TABLET | Freq: Every day | ORAL | Status: AC
Start: 2015-12-15 — End: ?

## 2015-12-15 NOTE — Patient Instructions (Addendum)
Medication Instructions:  Your physician has recommended you make the following change in your medication:  1) Decrease Amiodarone to 400 mg by mouth ONCE daily 2) HOLD Coumadin - effective TODAY 12/15/2015   Labwork: Your physician recommends that you return for lab work in: 12/22/2015 - At Dialysis - results sent to Dr. Swaziland    Testing/Procedures: You are scheduled for a cardiac catheterization on 12/23/2015 with Dr. Swaziland or associate.  Go to [facility] Main Entrance, arrive at 6:30am and your procedure is expected to start at 8:30am.  Enter thru the [facility] entrance A No food or drink after midnight on 12/22/2015. You may take your medications with a sip of water on the day of your procedure.   *It is important that you attend your scheduled dialysis on Thursday, 12/24/2015*  Any Other Special Instructions Will Be Listed Below (If Applicable).     If you need a refill on your cardiac medications before your next appointment, please call your pharmacy.

## 2015-12-15 NOTE — Progress Notes (Signed)
Patient ID: Samuel Rowland, male   DOB: [birth year], 55 y.o.   MRN: [id number]    Date:  12/15/2015   ID:  Samuel Rowland, DOB [birth year], MRN [id number]  PCP:  Pcp Not In System  Primary Cardiologist:  Jordan-New  Chief Complaint  Patient presents with  . Follow-up     no chest pain, no shortness of breath, no edema, no pain or cramping in legs,no lightheadedness or dizziness     History of Present Illness: Samuel Rowland is a 55 y.o. male with past medical history of HTN, PAF (on Coumadin), chronic systolic CHF (EF 08-65%) and ESRD (dialysis TTS) who is seen for follow up toady.  He was recently admitted from 11/03/2015 to 11/09/2015 at [facility] for new onset atrial flutter and underwent successful cardioversion on 11/06/2015 with return to NSR. He was started on Coumadin for anticoagulation and Lopressor  BID for rate control. He had an echocardiogram that admission which showed an EF of 30-35% and TEE showed borderline dilation of the LAA without thrombus. He reports having never seen a Cardiologist before that admission. Has never had a cardiac catheterization.  Lopressor was restarted 25 mg twice daily and he was started on amiodarone 400 mg twice a day for a week and then instructed to decrease to 400 mg daily thereafter.  He is not on ACE inhibitor or ARB due to end-stage renal disease.   Since that admission he has done well without recurrent palpitations or tachycardia. No chest pain or SOB. Weight has been stable in dialysis. No edema. Tolerating medications well. Because of multiple risk factors and low EF a Myoview study was done as noted below.    Wt Readings from Last 3 Encounters:  12/15/15 74.163 kg (163 lb 8 oz)  12/09/15 73.029 kg (161 lb)  11/26/15 73.029 kg (161 lb)     Past Medical History  Diagnosis Date  . Kidney transplant as cause of abnormal reaction or later complication   . Hypertension   . Nausea and vomiting     chronic  . Anemia   . Atrial  fibrillation (HCC)   . Heart murmur   . Paroxysmal atrial fibrillation with RVR (HCC)   . GERD (gastroesophageal reflux disease)   . Renal disorder     S/P right transplant 2010  . ESRD (end stage renal disease) on dialysis [facility])     "[facility]; TTS since ~10/2014" (11/12/2015)    Current Outpatient Prescriptions  Medication Sig Dispense Refill  . cinacalcet (SENSIPAR) 30 MG tablet Take 30 mg by mouth daily.    . iron polysaccharides (NIFEREX) 150 MG capsule Take 150 mg by mouth 2 (two) times daily.    . metoprolol tartrate (LOPRESSOR) 25 MG tablet Take 1 tablet (25 mg total) by mouth 2 (two) times daily. 60 tablet 0  . multivitamin (RENA-VIT) TABS tablet Take 1 tablet by mouth at bedtime. 30 tablet 0  . predniSONE (DELTASONE) 5 MG tablet Take 15 mg by mouth daily with breakfast.     . ranitidine (ZANTAC) 150 MG tablet Take 150 mg by mouth daily as needed for heartburn.    . sevelamer carbonate (RENVELA) 2.4 G PACK Take 2.4 g by mouth 3 (three) times daily with meals.    Marland Kitchen sulfamethoxazole-trimethoprim (BACTRIM,SEPTRA) 400-80 MG tablet Take 1 tablet by mouth 3 (three) times a week.    . warfarin (COUMADIN) 2 MG tablet Take 2 mg by mouth daily.    Marland Kitchen amiodarone (PACERONE) 400 MG tablet  Take 1 tablet (400 mg total) by mouth daily. 30 tablet 11   No current facility-administered medications for this visit.    Allergies:   No Known Allergies  Social History:  The patient  reports that he has quit smoking. His smoking use included Cigars. He has never used smokeless tobacco. He reports that he does not drink alcohol or use illicit drugs.   Family history:   Family History  Problem Relation Age of Onset  . Hypertension Father     ROS:  Please see the history of present illness.  All other systems reviewed and negative.   PHYSICAL EXAM: VS:  BP 144/72 mmHg  Pulse 72  Ht  (1.778 m)  Wt 74.163 kg (163 lb 8 oz)  BMI 23.46 kg/m2 Well nourished, well developed, in no  acute distress HEENT: Pupils are equal round react to light accommodation extraocular movements are intact.  Neck: no JVDNo cervical lymphadenopathy. Cardiac: Regular rate and rhythm with 3/6 systolic murmur heard throughout the precordium.  Lungs:  clear to auscultation bilaterally, no wheezing, rhonchi or rales Abd: firm and tender on the right side ever since kidney transplant, positive bowel sounds all quadrants,  Ext: no lower extremity edema.  2+ radial and dorsalis pedis pulses. functioning AV graft in left upper arm.  Skin: warm and dry Neuro:  Grossly normal  EKG: not done.  Myoview: 12/09/15: Study Highlights     The left ventricular ejection fraction is moderately decreased (30-44%).  Nuclear stress EF: 41%.  There was no ST segment deviation noted during stress.  Findings consistent with ischemia.  This is an intermediate risk study.  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 Study intermediate risk due to reduced LV function.     ASSESSMENT AND PLAN:  Problem List Items Addressed This Visit    Essential hypertension   Relevant Medications   amiodarone (PACERONE) 400 MG tablet   Other Relevant Orders   Basic metabolic panel   CBC with Differential/Platelet   APTT   Protime-INR   Atrial fibrillation (HCC) [I48.91]   Relevant Medications   amiodarone (PACERONE) 400 MG tablet   Other Relevant Orders   Basic metabolic panel   CBC with Differential/Platelet   APTT   Protime-INR   Long term (current) use of anticoagulants [Z79.01]   Relevant Orders   Basic metabolic panel   CBC with Differential/Platelet   APTT   Protime-INR   Abnormal nuclear stress test - Primary   Relevant Orders   Basic metabolic  panel   CBC with Differential/Platelet   APTT   Protime-INR   ESRD (end stage renal disease) (HCC)   Relevant Orders   Basic metabolic panel   CBC with Differential/Platelet   APTT   Protime-INR     1. Atrial fibrillation: paroxysmal. S/p DCCV. Maintaining NSR on amiodarone. On coumadin for anticoagulation. With his ESRD he is not a candidate for other anticoagulants. [place] vasc score of 2. Will reduce amiodarone today to 400 mg daily.   2. Cardiomyopathy with EF 30-35%. Myoview study suggests possible ischemic etiology. May also be exacerbated by AFib with RVR. On beta blocker. Asymptomatic and euvolemic on dialysis. Will arrange LHC with possible PCI to assess coronary risk. Myoview study intermediate risk. Will need to hold Coumadin. Plan procedure on Feb 1. Will  arrange lab work in dialysis the day prior. The procedure and risks were reviewed including but not limited to death, myocardial infarction, stroke, arrythmias, bleeding, transfusion, emergency surgery, dye allergy, or renal dysfunction. The patient voices understanding and is agreeable to proceed..   3. HTN controlled.  4. Heart murmur. Echo showed AV sclerosis, mild MR and moderate TR. Some of murmur may be radiated from AV fistula.Marland Kitchen

## 2015-12-23 ENCOUNTER — Encounter (HOSPITAL_COMMUNITY)
Admission: RE | Disposition: A | Discharge status: Home / Self Care | Payer: Self-pay | Source: Ambulatory Visit | Attending: Cardiology

## 2015-12-23 ENCOUNTER — Ambulatory Visit (HOSPITAL_COMMUNITY)
Admission: RE | Admit: 2015-12-23 | Discharge: 2015-12-23 | Disposition: A | Discharge status: Home / Self Care | Payer: Medicare Other | Source: Ambulatory Visit | Attending: Cardiology | Admitting: Cardiology

## 2015-12-23 ENCOUNTER — Encounter (HOSPITAL_COMMUNITY): Payer: Self-pay | Admitting: Cardiology

## 2015-12-23 DIAGNOSIS — Z87891 Personal history of nicotine dependence: Secondary | ICD-10-CM | POA: Insufficient documentation

## 2015-12-23 DIAGNOSIS — Z8249 Family history of ischemic heart disease and other diseases of the circulatory system: Secondary | ICD-10-CM | POA: Insufficient documentation

## 2015-12-23 DIAGNOSIS — N186 End stage renal disease: Secondary | ICD-10-CM | POA: Diagnosis present

## 2015-12-23 DIAGNOSIS — Z992 Dependence on renal dialysis: Secondary | ICD-10-CM | POA: Insufficient documentation

## 2015-12-23 DIAGNOSIS — I429 Cardiomyopathy, unspecified: Secondary | ICD-10-CM | POA: Diagnosis not present

## 2015-12-23 DIAGNOSIS — R9439 Abnormal result of other cardiovascular function study: Secondary | ICD-10-CM | POA: Diagnosis present

## 2015-12-23 DIAGNOSIS — I132 Hypertensive heart and chronic kidney disease with heart failure and with stage 5 chronic kidney disease, or end stage renal disease: Secondary | ICD-10-CM | POA: Diagnosis not present

## 2015-12-23 DIAGNOSIS — Y830 Surgical operation with transplant of whole organ as the cause of abnormal reaction of the patient, or of later complication, without mention of misadventure at the time of the procedure: Secondary | ICD-10-CM | POA: Insufficient documentation

## 2015-12-23 DIAGNOSIS — Z7901 Long term (current) use of anticoagulants: Secondary | ICD-10-CM | POA: Diagnosis not present

## 2015-12-23 DIAGNOSIS — I5022 Chronic systolic (congestive) heart failure: Secondary | ICD-10-CM | POA: Insufficient documentation

## 2015-12-23 DIAGNOSIS — I2510 Atherosclerotic heart disease of native coronary artery without angina pectoris: Secondary | ICD-10-CM | POA: Insufficient documentation

## 2015-12-23 DIAGNOSIS — I480 Paroxysmal atrial fibrillation: Secondary | ICD-10-CM | POA: Diagnosis not present

## 2015-12-23 DIAGNOSIS — K219 Gastro-esophageal reflux disease without esophagitis: Secondary | ICD-10-CM | POA: Insufficient documentation

## 2015-12-23 DIAGNOSIS — T8611 Kidney transplant rejection: Secondary | ICD-10-CM | POA: Diagnosis not present

## 2015-12-23 DIAGNOSIS — R011 Cardiac murmur, unspecified: Secondary | ICD-10-CM | POA: Insufficient documentation

## 2015-12-23 DIAGNOSIS — D649 Anemia, unspecified: Secondary | ICD-10-CM | POA: Diagnosis not present

## 2015-12-23 DIAGNOSIS — I10 Essential (primary) hypertension: Secondary | ICD-10-CM | POA: Diagnosis present

## 2015-12-23 HISTORY — PX: CARDIAC CATHETERIZATION: SHX172

## 2015-12-23 LAB — PROTIME-INR
INR: 1.54 — AB (ref 0.00–1.49)
PROTHROMBIN TIME: 18.6 s — AB (ref 11.6–15.2)

## 2015-12-23 SURGERY — LEFT HEART CATH AND CORONARY ANGIOGRAPHY
Anesthesia: LOCAL

## 2015-12-23 MED ORDER — MIDAZOLAM HCL 2 MG/2ML IJ SOLN
INTRAMUSCULAR | Status: DC | PRN
  Administered 2015-12-23 (×2): 1 mg via INTRAVENOUS

## 2015-12-23 MED ORDER — LIDOCAINE HCL (PF) 1 % IJ SOLN
INTRAMUSCULAR | Status: DC | PRN
  Administered 2015-12-23: 12 mL via INTRADERMAL

## 2015-12-23 MED ORDER — SODIUM CHLORIDE 0.9% FLUSH: 3.0000 mL | Freq: Two times a day (BID) | INTRAVENOUS | Status: DC

## 2015-12-23 MED ORDER — HEPARIN (PORCINE) IN NACL 2-0.9 UNIT/ML-% IJ SOLN
INTRAMUSCULAR | Status: AC
  Filled 2015-12-23: qty 1500

## 2015-12-23 MED ORDER — SODIUM CHLORIDE 0.9 % IV SOLN: 250.0000 mL | INTRAVENOUS | Status: DC | PRN

## 2015-12-23 MED ORDER — SODIUM CHLORIDE 0.9% FLUSH: 3.0000 mL | INTRAVENOUS | Status: DC | PRN

## 2015-12-23 MED ORDER — ASPIRIN 81 MG PO CHEW
CHEWABLE_TABLET | ORAL | Status: AC
  Administered 2015-12-23: 81 mg via ORAL
  Filled 2015-12-23: qty 1

## 2015-12-23 MED ORDER — FENTANYL CITRATE (PF) 100 MCG/2ML IJ SOLN
INTRAMUSCULAR | Status: DC | PRN
  Administered 2015-12-23 (×2): 25 ug via INTRAVENOUS

## 2015-12-23 MED ORDER — HEPARIN (PORCINE) IN NACL 2-0.9 UNIT/ML-% IJ SOLN
INTRAMUSCULAR | Status: DC | PRN
  Administered 2015-12-23: 1500 mL

## 2015-12-23 MED ORDER — IOHEXOL 350 MG/ML SOLN
INTRAVENOUS | Status: DC | PRN
  Administered 2015-12-23: 55 mL via INTRA_ARTERIAL

## 2015-12-23 MED ORDER — MIDAZOLAM HCL 2 MG/2ML IJ SOLN
INTRAMUSCULAR | Status: AC
  Filled 2015-12-23: qty 2

## 2015-12-23 MED ORDER — ASPIRIN 81 MG PO CHEW
81.0000 mg | CHEWABLE_TABLET | ORAL | Status: AC
  Administered 2015-12-23: 81 mg via ORAL

## 2015-12-23 MED ORDER — LIDOCAINE HCL (PF) 1 % IJ SOLN
INTRAMUSCULAR | Status: AC
  Filled 2015-12-23: qty 30

## 2015-12-23 MED ORDER — FENTANYL CITRATE (PF) 100 MCG/2ML IJ SOLN
INTRAMUSCULAR | Status: AC
  Filled 2015-12-23: qty 2

## 2015-12-23 MED ORDER — SODIUM CHLORIDE 0.9 % IV SOLN
250.0000 mL | INTRAVENOUS | Status: DC | PRN
Start: 2015-12-23 — End: 2015-12-23

## 2015-12-23 MED ORDER — HYDRALAZINE HCL 20 MG/ML IJ SOLN
INTRAMUSCULAR | Status: AC
  Filled 2015-12-23: qty 1

## 2015-12-23 MED ORDER — SODIUM CHLORIDE 0.9 % IV SOLN
INTRAVENOUS | Status: DC
  Administered 2015-12-23: 07:00:00 via INTRAVENOUS

## 2015-12-23 MED ORDER — HYDRALAZINE HCL 20 MG/ML IJ SOLN
INTRAMUSCULAR | Status: DC | PRN
  Administered 2015-12-23: 10 mg via INTRAVENOUS

## 2015-12-23 SURGICAL SUPPLY — 8 items
CATH INFINITI 5FR MULTPACK ANG (CATHETERS) ×2 IMPLANT
KIT HEART LEFT (KITS) ×2 IMPLANT
PACK CARDIAC CATHETERIZATION (CUSTOM PROCEDURE TRAY) ×2 IMPLANT
SHEATH PINNACLE 5F 10CM (SHEATH) ×2 IMPLANT
SYR MEDRAD MARK V 150ML (SYRINGE) ×2 IMPLANT
TRANSDUCER W/STOPCOCK (MISCELLANEOUS) ×2 IMPLANT
WIRE EMERALD 3MM-J .035X150CM (WIRE) ×2 IMPLANT
WIRE EMERALD ST .035X150CM (WIRE) IMPLANT

## 2015-12-23 NOTE — Progress Notes (Addendum)
Site area: RFA Site Prior to Removal:  Level slightly raised/mod hard above site Pressure Applied For:51min Manual:  yes  Patient Status During Pull:stable   Post Pull Site:  Level 0 Post Pull Instructions Given:  yes Post Pull Pulses Present: palpable Dressing Applied:  clear Bedrest begins @ 0945 till 1345 Comments:

## 2015-12-23 NOTE — Interval H&P Note (Signed)
History and Physical Interval Note:  12/23/2015 8:30 AM  Samuel Rowland  has presented today for surgery, with the diagnosis of abnormal stress test  The various methods of treatment have been discussed with the patient and family. After consideration of risks, benefits and other options for treatment, the patient has consented to  Procedure(s): Left Heart Cath and Coronary Angiography (N/A) as a surgical intervention .  The patient's history has been reviewed, patient examined, no change in status, stable for surgery.  I have reviewed the patient's chart and labs.  Questions were answered to the patient's satisfaction.    Cath Lab Visit (complete for each Cath Lab visit)  Clinical Evaluation Leading to the Procedure:   ACS: No.  Non-ACS:    Anginal Classification: CCS I  Anti-ischemic medical therapy: Minimal Therapy (1 class of medications)  Non-Invasive Test Results: Intermediate-risk stress test findings: cardiac mortality 1-3%/year  Prior CABG: No previous CABG       Theron Arista [facility] 12/23/2015 8:30 AM

## 2015-12-23 NOTE — H&P (View-Only) (Signed)
Patient ID: Samuel Rowland, male   DOB: 09/01/1961, 55 y.o.   MRN: [id number]    Date:  12/15/2015   ID:  Samuel Rowland, DOB 09/21/1961, MRN [id number]  PCP:  Pcp Not In System  Primary Cardiologist:  Sagan Maselli-New  Chief Complaint  Patient presents with  . Follow-up     no chest pain, no shortness of breath, no edema, no pain or cramping in legs,no lightheadedness or dizziness     History of Present Illness: [name] is a 55 y.o. male with past medical history of HTN, PAF (on Coumadin), chronic systolic CHF (EF 30-35%) and ESRD (dialysis TTS) who is seen for follow up toady.  He was recently admitted from 11/03/2015 to 11/09/2015 at [facility] for new onset atrial flutter and underwent successful cardioversion on 11/06/2015 with return to NSR. He was started on Coumadin for anticoagulation and Lopressor 50mg BID for rate control. He had an echocardiogram that admission which showed an EF of 30-35% and TEE showed borderline dilation of the LAA without thrombus. He reports having never seen a Cardiologist before that admission. Has never had a cardiac catheterization.  Lopressor was restarted 25 mg twice daily and he was started on amiodarone 400 mg twice a day for a week and then instructed to decrease to 400 mg daily thereafter.  He is not on ACE inhibitor or ARB due to end-stage renal disease.   Since that admission he has done well without recurrent palpitations or tachycardia. No chest pain or SOB. Weight has been stable in dialysis. No edema. Tolerating medications well. Because of multiple risk factors and low EF a Myoview study was done as noted below.    Wt Readings from Last 3 Encounters:  12/15/15 74.163 kg (163 lb 8 oz)  12/09/15 73.029 kg (161 lb)  11/26/15 73.029 kg (161 lb)     Past Medical History  Diagnosis Date  . Kidney transplant as cause of abnormal reaction or later complication   . Hypertension   . Nausea and vomiting     chronic  . Anemia   . Atrial  fibrillation (HCC)   . Heart murmur   . Paroxysmal atrial fibrillation with RVR (HCC)   . GERD (gastroesophageal reflux disease)   . Renal disorder     S/P right transplant 2010  . ESRD (end stage renal disease) on dialysis (HCC)     "[facility] [place]; TTS since ~10/2014" (11/12/2015)    Current Outpatient Prescriptions  Medication Sig Dispense Refill  . cinacalcet (SENSIPAR) 30 MG tablet Take 30 mg by mouth daily.    . iron polysaccharides (NIFEREX) 150 MG capsule Take 150 mg by mouth 2 (two) times daily.    . metoprolol tartrate (LOPRESSOR) 25 MG tablet Take 1 tablet (25 mg total) by mouth 2 (two) times daily. 60 tablet 0  . multivitamin (RENA-VIT) TABS tablet Take 1 tablet by mouth at bedtime. 30 tablet 0  . predniSONE (DELTASONE) 5 MG tablet Take 15 mg by mouth daily with breakfast.     . ranitidine (ZANTAC) 150 MG tablet Take 150 mg by mouth daily as needed for heartburn.    . sevelamer carbonate (RENVELA) 2.4 G PACK Take 2.4 g by mouth 3 (three) times daily with meals.    . sulfamethoxazole-trimethoprim (BACTRIM,SEPTRA) 400-80 MG tablet Take 1 tablet by mouth 3 (three) times a week.    . warfarin (COUMADIN) 2 MG tablet Take 2 mg by mouth daily.    . amiodarone (PACERONE) 400 MG tablet   Take 1 tablet (400 mg total) by mouth daily. 30 tablet 11   No current facility-administered medications for this visit.    Allergies:   No Known Allergies  Social History:  The patient  reports that he has quit smoking. His smoking use included Cigars. He has never used smokeless tobacco. He reports that he does not drink alcohol or use illicit drugs.   Family history:   Family History  Problem Relation Age of Onset  . Hypertension Father     ROS:  Please see the history of present illness.  All other systems reviewed and negative.   PHYSICAL EXAM: VS:  BP 144/72 mmHg  Pulse 72  Ht  (1.778 m)  Wt 74.163 kg (163 lb 8 oz)  BMI 23.46 kg/m2 Well nourished, well developed, in no  acute distress HEENT: Pupils are equal round react to light accommodation extraocular movements are intact.  Neck: no JVDNo cervical lymphadenopathy. Cardiac: Regular rate and rhythm with 3/6 systolic murmur heard throughout the precordium.  Lungs:  clear to auscultation bilaterally, no wheezing, rhonchi or rales Abd: firm and tender on the right side ever since kidney transplant, positive bowel sounds all quadrants,  Ext: no lower extremity edema.  2+ radial and dorsalis pedis pulses. functioning AV graft in left upper arm.  Skin: warm and dry Neuro:  Grossly normal  EKG: not done.  Myoview: 12/09/15: Study Highlights     The left ventricular ejection fraction is moderately decreased (30-44%).  Nuclear stress EF: 41%.  There was no ST segment deviation noted during stress.  Findings consistent with ischemia.  This is an intermediate risk study.  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 Study intermediate risk due to reduced LV function.     ASSESSMENT AND PLAN:  Problem List Items Addressed This Visit    Essential hypertension   Relevant Medications   amiodarone (PACERONE) 400 MG tablet   Other Relevant Orders   Basic metabolic panel   CBC with Differential/Platelet   APTT   Protime-INR   Atrial fibrillation (HCC) [I48.91]   Relevant Medications   amiodarone (PACERONE) 400 MG tablet   Other Relevant Orders   Basic metabolic panel   CBC with Differential/Platelet   APTT   Protime-INR   Long term (current) use of anticoagulants [Z79.01]   Relevant Orders   Basic metabolic panel   CBC with Differential/Platelet   APTT   Protime-INR   Abnormal nuclear stress test - Primary   Relevant Orders   Basic metabolic  panel   CBC with Differential/Platelet   APTT   Protime-INR   ESRD (end stage renal disease) (HCC)   Relevant Orders   Basic metabolic panel   CBC with Differential/Platelet   APTT   Protime-INR     1. Atrial fibrillation: paroxysmal. S/p DCCV. Maintaining NSR on amiodarone. On coumadin for anticoagulation. With his ESRD he is not a candidate for other anticoagulants. [place] vasc score of 2. Will reduce amiodarone today to 400 mg daily.   2. Cardiomyopathy with EF 30-35%. Myoview study suggests possible ischemic etiology. May also be exacerbated by AFib with RVR. On beta blocker. Asymptomatic and euvolemic on dialysis. Will arrange LHC with possible PCI to assess coronary risk. Myoview study intermediate risk. Will need to hold Coumadin. Plan procedure on Feb 1. Will  arrange lab work in dialysis the day prior. The procedure and risks were reviewed including but not limited to death, myocardial infarction, stroke, arrythmias, bleeding, transfusion, emergency surgery, dye allergy, or renal dysfunction. The patient voices understanding and is agreeable to proceed..   3. HTN controlled.  4. Heart murmur. Echo showed AV sclerosis, mild MR and moderate TR. Some of murmur may be radiated from AV fistula..     

## 2015-12-23 NOTE — Discharge Instructions (Addendum)
Resume Coumadin tomorrow 12/24/15. Angiogram, Care After Refer to this sheet in the next few weeks. These instructions provide you with information about caring for yourself after your procedure. Your health care provider may also give you more specific instructions. Your treatment has been planned according to current medical practices, but problems sometimes occur. Call your health care provider if you have any problems or questions after your procedure. WHAT TO EXPECT AFTER THE PROCEDURE After your procedure, it is typical to have the following:  Bruising at the catheter insertion site that usually fades within 1-2 weeks.  Blood collecting in the tissue (hematoma) that may be painful to the touch. It should usually decrease in size and tenderness within 1-2 weeks. HOME CARE INSTRUCTIONS  Take medicines only as directed by your health care provider.  You may shower 24-48 hours after the procedure or as directed by your health care provider. Remove the bandage (dressing) and gently wash the site with plain soap and water. Pat the area dry with a clean towel. Do not rub the site, because this may cause bleeding.  Do not take baths, swim, or use a hot tub until your health care provider approves.  Check your insertion site every day for redness, swelling, or drainage.  Do not apply powder or lotion to the site.  Do not lift over 10 lb (4.5 kg) for 5 days after your procedure or as directed by your health care provider.  Ask your health care provider when it is okay to:  Return to work or school.  Resume usual physical activities or sports.  Resume sexual activity.  Do not drive home if you are discharged the same day as the procedure. Have someone else drive you.  You may drive 24 hours after the procedure unless otherwise instructed by your health care provider.  Do not operate machinery or power tools for 24 hours after the procedure or as directed by your health care provider.  If  your procedure was done as an outpatient procedure, which means that you went home the same day as your procedure, a responsible adult should be with you for the first 24 hours after you arrive home.  Keep all follow-up visits as directed by your health care provider. This is important. SEEK MEDICAL CARE IF:  You have a fever.  You have chills.  You have increased bleeding from the catheter insertion site. Hold pressure on the site. SEEK IMMEDIATE MEDICAL CARE IF:  You have unusual pain at the catheter insertion site.  You have redness, warmth, or swelling at the catheter insertion site.  You have drainage (other than a small amount of blood on the dressing) from the catheter insertion site.  The catheter insertion site is bleeding, and the bleeding does not stop after 30 minutes of holding steady pressure on the site.  The area near or just beyond the catheter insertion site becomes pale, cool, tingly, or numb.   This information is not intended to replace advice given to you by your health care provider. Make sure you discuss any questions you have with your health care provider.   Document Released: 05/26/2005 Document Revised: 11/28/2014 Document Reviewed: 04/10/2013 Elsevier Interactive Patient Education [employer]

## 2015-12-25 ENCOUNTER — Encounter (HOSPITAL_COMMUNITY): Payer: Self-pay | Admitting: Cardiology

## 2016-01-01 ENCOUNTER — Encounter: Payer: Medicare Other | Admitting: Pharmacist Clinician (PhC)/ Clinical Pharmacy Specialist

## 2016-01-01 ENCOUNTER — Ambulatory Visit (INDEPENDENT_AMBULATORY_CARE_PROVIDER_SITE_OTHER): Payer: Medicare Other | Admitting: Pharmacist Clinician (PhC)/ Clinical Pharmacy Specialist

## 2016-01-01 DIAGNOSIS — Z7901 Long term (current) use of anticoagulants: Secondary | ICD-10-CM | POA: Diagnosis not present

## 2016-01-01 DIAGNOSIS — I480 Paroxysmal atrial fibrillation: Secondary | ICD-10-CM

## 2016-01-01 LAB — POCT INR: INR: 1.1

## 2016-01-08 ENCOUNTER — Ambulatory Visit (INDEPENDENT_AMBULATORY_CARE_PROVIDER_SITE_OTHER): Payer: Medicare Other | Admitting: Pharmacist Clinician (PhC)/ Clinical Pharmacy Specialist

## 2016-01-08 DIAGNOSIS — Z7901 Long term (current) use of anticoagulants: Secondary | ICD-10-CM

## 2016-01-08 DIAGNOSIS — I480 Paroxysmal atrial fibrillation: Secondary | ICD-10-CM | POA: Diagnosis not present

## 2016-01-08 LAB — POCT INR: INR: 1.7

## 2016-01-18 ENCOUNTER — Ambulatory Visit (INDEPENDENT_AMBULATORY_CARE_PROVIDER_SITE_OTHER): Payer: Medicare Other | Admitting: Pharmacist Clinician (PhC)/ Clinical Pharmacy Specialist

## 2016-01-18 DIAGNOSIS — I480 Paroxysmal atrial fibrillation: Secondary | ICD-10-CM | POA: Diagnosis not present

## 2016-01-18 DIAGNOSIS — Z7901 Long term (current) use of anticoagulants: Secondary | ICD-10-CM | POA: Diagnosis not present

## 2016-01-18 LAB — POCT INR: INR: 4.3

## 2016-01-25 ENCOUNTER — Encounter: Payer: Medicare Other | Admitting: Pharmacist Clinician (PhC)/ Clinical Pharmacy Specialist

## 2016-02-22 ENCOUNTER — Ambulatory Visit (INDEPENDENT_AMBULATORY_CARE_PROVIDER_SITE_OTHER): Payer: Medicare Other | Admitting: Emergency Medicine

## 2016-02-22 ENCOUNTER — Inpatient Hospital Stay (HOSPITAL_COMMUNITY)
Admission: EM | Admit: 2016-02-22 | Discharge: 2016-02-26 | DRG: 727 | Disposition: A | Discharge status: Home / Self Care | Payer: Medicare Other | Attending: Internal Medicine | Admitting: Internal Medicine

## 2016-02-22 ENCOUNTER — Emergency Department (HOSPITAL_COMMUNITY): Payer: Medicare Other

## 2016-02-22 ENCOUNTER — Encounter (HOSPITAL_COMMUNITY): Payer: Self-pay | Admitting: Emergency Medicine

## 2016-02-22 VITALS — BP 168/94 | HR 82 | Temp 99.1°F | Resp 16 | Ht 70.0 in | Wt 183.0 lb

## 2016-02-22 DIAGNOSIS — Z940 Kidney transplant status: Secondary | ICD-10-CM

## 2016-02-22 DIAGNOSIS — Y839 Surgical procedure, unspecified as the cause of abnormal reaction of the patient, or of later complication, without mention of misadventure at the time of the procedure: Secondary | ICD-10-CM | POA: Diagnosis present

## 2016-02-22 DIAGNOSIS — I120 Hypertensive chronic kidney disease with stage 5 chronic kidney disease or end stage renal disease: Secondary | ICD-10-CM | POA: Diagnosis present

## 2016-02-22 DIAGNOSIS — N186 End stage renal disease: Secondary | ICD-10-CM | POA: Diagnosis not present

## 2016-02-22 DIAGNOSIS — Z8249 Family history of ischemic heart disease and other diseases of the circulatory system: Secondary | ICD-10-CM | POA: Diagnosis not present

## 2016-02-22 DIAGNOSIS — N2581 Secondary hyperparathyroidism of renal origin: Secondary | ICD-10-CM | POA: Diagnosis present

## 2016-02-22 DIAGNOSIS — K219 Gastro-esophageal reflux disease without esophagitis: Secondary | ICD-10-CM | POA: Diagnosis present

## 2016-02-22 DIAGNOSIS — Z7901 Long term (current) use of anticoagulants: Secondary | ICD-10-CM | POA: Diagnosis not present

## 2016-02-22 DIAGNOSIS — I480 Paroxysmal atrial fibrillation: Secondary | ICD-10-CM | POA: Diagnosis present

## 2016-02-22 DIAGNOSIS — L723 Sebaceous cyst: Secondary | ICD-10-CM | POA: Diagnosis not present

## 2016-02-22 DIAGNOSIS — D631 Anemia in chronic kidney disease: Secondary | ICD-10-CM | POA: Diagnosis present

## 2016-02-22 DIAGNOSIS — L03314 Cellulitis of groin: Secondary | ICD-10-CM

## 2016-02-22 DIAGNOSIS — Z79899 Other long term (current) drug therapy: Secondary | ICD-10-CM | POA: Diagnosis not present

## 2016-02-22 DIAGNOSIS — N509 Disorder of male genital organs, unspecified: Secondary | ICD-10-CM | POA: Diagnosis not present

## 2016-02-22 DIAGNOSIS — N4400 Torsion of testis, unspecified: Secondary | ICD-10-CM

## 2016-02-22 DIAGNOSIS — Z87891 Personal history of nicotine dependence: Secondary | ICD-10-CM | POA: Diagnosis not present

## 2016-02-22 DIAGNOSIS — I4891 Unspecified atrial fibrillation: Secondary | ICD-10-CM | POA: Diagnosis present

## 2016-02-22 DIAGNOSIS — N5089 Other specified disorders of the male genital organs: Secondary | ICD-10-CM

## 2016-02-22 DIAGNOSIS — N184 Chronic kidney disease, stage 4 (severe): Secondary | ICD-10-CM | POA: Diagnosis not present

## 2016-02-22 DIAGNOSIS — Z992 Dependence on renal dialysis: Secondary | ICD-10-CM

## 2016-02-22 DIAGNOSIS — N5082 Scrotal pain: Secondary | ICD-10-CM | POA: Diagnosis present

## 2016-02-22 DIAGNOSIS — N492 Inflammatory disorders of scrotum: Principal | ICD-10-CM | POA: Diagnosis present

## 2016-02-22 LAB — CBC WITH DIFFERENTIAL/PLATELET
BASOS ABS: 0 10*3/uL (ref 0.0–0.1)
BASOS PCT: 0 %
Eosinophils Absolute: 0.1 10*3/uL (ref 0.0–0.7)
Eosinophils Relative: 1 %
HEMATOCRIT: 35.5 % — AB (ref 39.0–52.0)
HEMOGLOBIN: 11.3 g/dL — AB (ref 13.0–17.0)
Lymphocytes Relative: 8 %
Lymphs Abs: 1.7 10*3/uL (ref 0.7–4.0)
MCH: 25.6 pg — ABNORMAL LOW (ref 26.0–34.0)
MCHC: 31.8 g/dL (ref 30.0–36.0)
MCV: 80.5 fL (ref 78.0–100.0)
MONOS PCT: 8 %
Monocytes Absolute: 1.6 10*3/uL — ABNORMAL HIGH (ref 0.1–1.0)
NEUTROS ABS: 17.2 10*3/uL — AB (ref 1.7–7.7)
NEUTROS PCT: 83 %
Platelets: 216 10*3/uL (ref 150–400)
RBC: 4.41 MIL/uL (ref 4.22–5.81)
RDW: 21 % — ABNORMAL HIGH (ref 11.5–15.5)
WBC: 20.6 10*3/uL — ABNORMAL HIGH (ref 4.0–10.5)

## 2016-02-22 LAB — BASIC METABOLIC PANEL
ANION GAP: 16 — AB (ref 5–15)
BUN: 49 mg/dL — ABNORMAL HIGH (ref 6–20)
CALCIUM: 9.1 mg/dL (ref 8.9–10.3)
CO2: 20 mmol/L — AB (ref 22–32)
Chloride: 99 mmol/L — ABNORMAL LOW (ref 101–111)
Creatinine, Ser: 10.85 mg/dL — ABNORMAL HIGH (ref 0.61–1.24)
GFR calc non Af Amer: 5 mL/min — ABNORMAL LOW (ref >60)
GFR, EST AFRICAN AMERICAN: 5 mL/min — AB (ref >60)
Glucose, Bld: 82 mg/dL (ref 65–99)
POTASSIUM: 5.2 mmol/L — AB (ref 3.5–5.1)
Sodium: 135 mmol/L (ref 135–145)

## 2016-02-22 LAB — PROTIME-INR
INR: 2.37 — AB (ref 0.00–1.49)
Prothrombin Time: 25.6 seconds — ABNORMAL HIGH (ref 11.6–15.2)

## 2016-02-22 LAB — I-STAT CG4 LACTIC ACID, ED
Lactic Acid, Venous: 1.16 mmol/L (ref 0.5–2.0)
Lactic Acid, Venous: 0.88 mmol/L (ref 0.5–2.0)

## 2016-02-22 MED ORDER — METOPROLOL TARTRATE 25 MG PO TABS
25.0000 mg | ORAL_TABLET | Freq: Two times a day (BID) | ORAL | Status: DC
  Administered 2016-02-23 – 2016-02-26 (×8): 25 mg via ORAL
  Filled 2016-02-22 (×8): qty 1

## 2016-02-22 MED ORDER — CINACALCET HCL 30 MG PO TABS: 30.0000 mg | ORAL_TABLET | Freq: Every day | ORAL | Status: DC

## 2016-02-22 MED ORDER — HYDROMORPHONE HCL 1 MG/ML IJ SOLN
0.5000 mg | Freq: Once | INTRAMUSCULAR | Status: AC
  Administered 2016-02-22: 0.5 mg via INTRAVENOUS
  Filled 2016-02-22: qty 1

## 2016-02-22 MED ORDER — ACETAMINOPHEN 650 MG RE SUPP: 650.0000 mg | Freq: Four times a day (QID) | RECTAL | Status: DC | PRN

## 2016-02-22 MED ORDER — PREDNISONE 10 MG PO TABS
10.0000 mg | ORAL_TABLET | Freq: Every day | ORAL | Status: DC
  Administered 2016-02-23 – 2016-02-26 (×4): 10 mg via ORAL
  Filled 2016-02-22 (×4): qty 1

## 2016-02-22 MED ORDER — SEVELAMER CARBONATE 800 MG PO TABS
800.0000 mg | ORAL_TABLET | ORAL | Status: DC
Start: 2016-02-22 — End: 2016-02-22

## 2016-02-22 MED ORDER — ONDANSETRON HCL 4 MG PO TABS: 4.0000 mg | ORAL_TABLET | Freq: Four times a day (QID) | ORAL | Status: DC | PRN

## 2016-02-22 MED ORDER — SEVELAMER CARBONATE 800 MG PO TABS
1600.0000 mg | ORAL_TABLET | Freq: Three times a day (TID) | ORAL | Status: DC
  Administered 2016-02-23 – 2016-02-26 (×9): 1600 mg via ORAL
  Filled 2016-02-22 (×8): qty 2

## 2016-02-22 MED ORDER — ONDANSETRON HCL 4 MG/2ML IJ SOLN
4.0000 mg | Freq: Four times a day (QID) | INTRAMUSCULAR | Status: DC | PRN
  Administered 2016-02-23 (×2): 4 mg via INTRAVENOUS
  Filled 2016-02-22 (×2): qty 2

## 2016-02-22 MED ORDER — WARFARIN - PHARMACIST DOSING INPATIENT
Freq: Every day | Status: DC
  Administered 2016-02-24: 18:00:00

## 2016-02-22 MED ORDER — SEVELAMER CARBONATE 800 MG PO TABS: 800.0000 mg | ORAL_TABLET | ORAL | Status: DC | PRN

## 2016-02-22 MED ORDER — AMIODARONE HCL 100 MG PO TABS
400.0000 mg | ORAL_TABLET | Freq: Every day | ORAL | Status: DC
  Administered 2016-02-23 – 2016-02-26 (×4): 400 mg via ORAL
  Filled 2016-02-22 (×4): qty 4

## 2016-02-22 MED ORDER — HYDROCODONE-ACETAMINOPHEN 5-325 MG PO TABS: 1.0000 | ORAL_TABLET | ORAL | Status: DC | PRN

## 2016-02-22 MED ORDER — ACETAMINOPHEN 325 MG PO TABS: 650.0000 mg | ORAL_TABLET | Freq: Four times a day (QID) | ORAL | Status: DC | PRN

## 2016-02-22 MED ORDER — VANCOMYCIN HCL 10 G IV SOLR
1500.0000 mg | Freq: Once | INTRAVENOUS | Status: AC
  Administered 2016-02-22: 1500 mg via INTRAVENOUS
  Filled 2016-02-22: qty 1500

## 2016-02-22 MED ORDER — WARFARIN SODIUM 2 MG PO TABS
2.0000 mg | ORAL_TABLET | Freq: Every day | ORAL | Status: DC
  Administered 2016-02-23 – 2016-02-25 (×4): 2 mg via ORAL
  Filled 2016-02-22 (×5): qty 1

## 2016-02-22 NOTE — ED Notes (Signed)
[facility] here to transport patient. 

## 2016-02-22 NOTE — Progress Notes (Signed)
ANTICOAGULATION CONSULT NOTE - Initial Consult  Pharmacy Consult for Coumadin Indication: atrial fibrillation  No Known Allergies  Patient Measurements: Height: 5\' 9"  (175.3 cm) Weight: 182 lb 8.7 oz (82.8 kg) IBW/kg (Calculated) : 70.7  Vital Signs: Temp: 98.4 F (36.9 C) (04/03 2224) Temp Source: Oral (04/03 2223) BP: 168/91 mmHg (04/03 2224) Pulse Rate: 83 (04/03 2224)  Labs:  Recent Labs  02/22/16 1509 02/22/16 2310  HGB 11.3*  --   HCT 35.5*  --   PLT 216  --   LABPROT  --  25.6*  INR  --  2.37*  CREATININE 10.85*  --     Estimated Creatinine Clearance: 7.8 mL/min (by C-G formula based on Cr of 10.85).   Medical History: Past Medical History  Diagnosis Date  . Kidney transplant as cause of abnormal reaction or later complication   . Hypertension   . Nausea and vomiting     chronic  . Anemia   . Atrial fibrillation (HCC)   . Heart murmur   . Paroxysmal atrial fibrillation with RVR (HCC)   . GERD (gastroesophageal reflux disease)   . Renal disorder     S/P right transplant 2010  . ESRD (end stage renal disease) on dialysis [facility](HCC)     [place] [place] [facility] since ~10/2014" (11/12/2015)    Medications:  Prescriptions prior to admission  Medication Sig Dispense Refill Last Dose  . amiodarone (PACERONE) 400 MG tablet Take 1 tablet (400 mg total) by mouth daily. 30 tablet 11 02/21/2016 at Unknown time  . cinacalcet (SENSIPAR) 30 MG tablet Take 30 mg by mouth daily with supper.    02/21/2016 at Unknown time  . metoprolol tartrate (LOPRESSOR) 25 MG tablet Take 1 tablet (25 mg total) by mouth 2 (two) times daily. 60 tablet 0 02/22/2016 at 0800  . predniSONE (DELTASONE) 10 MG tablet Take 10 mg by mouth daily.   02/21/2016 at Unknown time  . sevelamer carbonate (RENVELA) 800 MG tablet Take 800-1,600 mg by mouth See admin instructions. Takes 1600mg  with each meal and 800mg  with each snack   02/21/2016 at Unknown time  . warfarin (COUMADIN) 2 MG tablet Take 2 mg by mouth  daily.   02/21/2016 at 2100    Assessment: 55 y.o. male admitted with cellulitis, h/o Afib, to continue Coumadin  Goal of Therapy:  INR 2-3 Monitor platelets by anticoagulation protocol: Yes   Plan:  Coumadin 2 mg daily Daily INR  Falisa Lamora, Gary FleetGregory Vernon 02/22/2016,11:52 PM

## 2016-02-22 NOTE — ED Notes (Signed)
PCP called, states he's sending the patient over for evaluation for fever with left scrotal pain, swelling, and infection with a large calcium deposit there. States the patient is a hemodialysis patient

## 2016-02-22 NOTE — Progress Notes (Signed)
Pharmacy Antibiotic Note  Samuel Rowland is a 55 y.o. male admitted on 02/22/2016 with scrotal pain and swelling.  He reports having calcium deposits in his scrotum since his kidney transplant in 2010.  Pharmacy has been consulted for Vancomycin dosing for cellulitis, possible infected calcified mass. Note: hemodoalysis patient with SCr 10.85 today; most recent dialysis session was Saturday 4/1 with next session scheduled for Tuesday, 4/4.  Plan:  Vancomycin 1500mg  IV once  Pharmacy to f/u further dosing based on level and HD sessions  Measure Vanc trough at steady state.  Follow up renal fxn, culture results, and clinical course.      Temp (24hrs), Avg:98.7 F (37.1 C), Min:98.2 F (36.8 C), Max:99.1 F (37.3 C)   Recent Labs Lab 02/22/16 1509  WBC 20.6*  CREATININE 10.85*    Estimated Creatinine Clearance: 8 mL/min (by C-G formula based on Cr of 10.85).    No Known Allergies  Antimicrobials this admission: 4/3 Vancomycin >>   Dose adjustments this admission:  Microbiology results: 4/3 BCx: sent  Thank you for allowing pharmacy to be a part of this patient's care.  Lynann Beaverhristine Gracy Ehly PharmD, BCPS Pager [phone] 02/22/2016 4:56 PM

## 2016-02-22 NOTE — H&P (Signed)
History and Physical  Samuel Rowland YNW:[id number] DOB: 05/26/1961 DOA: 02/22/2016   PCP: Pcp Not In System  Referring Physician: ED/ [name], [facility]  Chief Complaint: scrotal pain  HPI:  55 year old male with a history of ESRD (TTS), paroxysmal atrial fibrillation, hypertensionpresented to the emergency department with 3 day history of increasing erythema, pain, and edema of his left scrotum. The patient states that  He has known calcium deposits in his scrotal area for the past year. He denies any recent penile drainage or pen wound drainage from his scrotum.  He has some chronic pain in his scrotum, but in the past 3 days he has had worsening symptoms as noted above. He has subjective fevers and chills He presented to the urgent care on the day of admission, and was sent to the emergency department for further evaluation. 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 He is sexually active and monogamous.  In the emergency department, the patient had a low-grade temperature of 99.1, but he was hemodynamically stable. Ultrasound of his scrotum revealed bilateral microlithiasis, a right-sided 4 mm epididymal cyst, and heterogenous left epididymis without suggestion of epididymitis.There was nonspecific skin thickening but no evidence of testicular torsion.  WBC was 20.6. Potassium is 5.2. Serum creatinine 10.85. The patient was given vancomycin after blood cultures were obtained. Lactic acid was 1.16 Assessment/Plan: Scrotal cellulitis -Most of the erythema and tenderness is around an external cyst about his left scrotum -Continue vancomycin -Follow blood cultures -if no improvement, may need urology consultation -CBC in am  ESRD -I have already placed a consultation to nephrology -HD on Tuesday, Thursday, Saturday -patient last dialyzed on 02/20/2016 -transfer to [place] for HD  Hyperkalemia -give 1 dose Kayexalate  Paroxysmal atrial fibrillation -presently in sinus rhythm -rate  controlled -continue amiodarone -continue metoprolol tartrate -Continue warfarin  Hypertension -continue metoprolol tartrate  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 Kidney transplant as cause of abnormal reaction or later complication   . Hypertension   . Nausea and vomiting     chronic  . Anemia   . Atrial fibrillation (HCC)   . Heart murmur   . Paroxysmal atrial fibrillation with RVR (HCC)   . GERD (gastroesophageal reflux disease)   . Renal disorder     S/P right transplant 2010  . ESRD (end stage renal disease) on dialysis [facility](HCC)     [facility]; [facility] since ~10/2014" (11/12/2015)   Past Surgical History  Procedure Laterality Date  . [facility]  . Anterior cervical decomp/discectomy fusion  01/1999    C5-6   . Cardioversion    . Back surgery    . Colonoscopy w/ polypectomy  10/2009  . Esophagogastroduodenoscopy  10/2009  . Renal biopsy  2015  . Cardiac catheterization N/A 12/23/2015    Procedure: Left Heart Cath and Coronary Angiography;  Surgeon: Peter M SwazilandJordan, MD;  [facility];  Service: Cardiovascular;  Laterality: N/A;   Social History:  reports that he has quit smoking. His smoking use included Cigars. He has never used smokeless tobacco. He reports that he does not drink alcohol or use illicit drugs.   Family History  Problem Relation Age of Onset  . Hypertension Father      No Known Allergies  Prior to Admission medications   Medication Sig Start Date End Date Taking? Authorizing Provider  amiodarone (PACERONE) 400 MG tablet Take 1 tablet (400 mg total) by mouth daily. 12/15/15  Yes Peter M [place], MD  cinacalcet (SENSIPAR) 30 MG tablet Take 30 mg by mouth daily with supper.  08/26/14  Yes Historical Provider, MD  metoprolol tartrate (LOPRESSOR) 25 MG tablet Take 1  tablet (25 mg total) by mouth 2 (two) times daily. 11/13/15  Yes [name], DO  predniSONE (DELTASONE) 10 MG tablet Take 10 mg by mouth daily.   Yes Historical Provider, MD  sevelamer carbonate (RENVELA) 800 MG tablet Take 800-1,600 mg by mouth See admin instructions. Takes  with each meal and  with each snack   Yes Historical Provider, MD  warfarin (COUMADIN) 2 MG tablet Take 2 mg by mouth daily.   Yes Historical Provider, MD    Review of Systems:  Constitutional:  No weight loss, night sweats, Fevers, chills, fatigue.  Head&Eyes: No headache.  No vision loss.  No eye pain or scotoma ENT:  No Difficulty swallowing,Tooth/dental problems,Sore throat,  No ear ache, post nasal drip,  Cardio-vascular:  No chest pain, Orthopnea, PND, swelling in lower extremities,  dizziness, palpitations  GI:  No  abdominal pain, nausea, vomiting, diarrhea, loss of appetite, hematochezia, melena, heartburn, indigestion, Resp:  No shortness of breath with exertion or at rest. No cough. No coughing up of blood .No wheezing.No chest wall deformity  Skin:  no rash or lesions.  GU:  no dysuria, change in color of urine, no urgency or frequency. No flank pain.  Musculoskeletal:  No joint pain or swelling. No decreased range of motion. No back pain.  Psych:  No change in mood or affect. No depression or anxiety. Neurologic: No headache, no dysesthesia, no focal weakness, no vision loss. No syncope  Physical Exam: Filed Vitals:   02/22/16 1221 02/22/16 1704  BP: 161/101   Pulse: 83   Temp: 98.2 F (36.8 C)   TempSrc: Oral   Resp: 18   Weight:  83.008 kg (183 lb)  SpO2: 100%    General:  A&O x 3, NAD, nontoxic, pleasant/cooperative Head/Eye: No conjunctival hemorrhage, no icterus, [place]/AT, No nystagmus ENT:  No icterus,  No thrush, good dentition, no pharyngeal exudate Neck:  No masses, no lymphadenpathy, no bruits CV:  RRR, no rub, no gallop, no S3 Lung:  CTAB, good air movement, no  wheeze, no rhonchi Abdomen: soft/NT, +BS, nondistended, no peritoneal signs Ext: No cyanosis, No rashes, No petechiae, No lymphangitis, No edema Neuro: CNII-XII intact, strength 4/5 in bilateral upper and lower extremities, no dysmetria GU--mild erythema about the right scrotum with cystic type lesion on the inferior aspect of the scrotum with some tenderness to palpation. No open draining wounds. No necrosis. No crepitance.  Labs on Admission:  Basic Metabolic Panel:  Recent Labs Lab 02/22/16 1509  NA 135  K 5.2*  CL 99*  CO2 20*  GLUCOSE 82  BUN 49*  CREATININE 10.85*  CALCIUM 9.1   Liver Function Tests: No results for input(s): AST, ALT, ALKPHOS, BILITOT, PROT, ALBUMIN in the last 168 hours. No results for input(s): LIPASE, AMYLASE in the last 168 hours. No results for input(s): AMMONIA in the last 168 hours. CBC:  Recent Labs Lab 02/22/16 1509  WBC 20.6*  NEUTROABS 17.2*  HGB 11.3*  HCT 35.5*  MCV 80.5  PLT 216   Cardiac Enzymes: No results for input(s): CKTOTAL, CKMB, CKMBINDEX, TROPONINI in the  last 168 hours. BNP: Invalid input(s): POCBNP CBG: No results for input(s): GLUCAP in the last 168 hours.  Radiological Exams on Admission: US Scrotum  02/22/2016  CLINICAL DATA:  55 year old male with left testicular pain and swelling for 3 days. Initial encounter. EXAM: SCROTAL ULTRASOUND DOPPLER ULTRASOUND OF THE TESTICLES TECHNIQUE: Complete ultrasound examination of the testicles, epididymis, and other scrotal structures was performed. Color and spectral Doppler ultrasound were also utilized to evaluate blood flow to the testicles. COMPARISON:  None. FINDINGS: Measurements: 4.5 x 1.8 x 3.2 cm. Microlithiasis without mass detected. Left testicle Measurements: 3.5 x 1.9 x 2.6 cm. Microlithiasis without mass detected. Right epididymis:  4 mm epididymal head cyst. Left epididymis: Slightly heterogeneous without discrete mass appear. Hydrocele:  None visualized. Varicocele:   None visualized. Skin thickening.  Etiology indeterminate.  Cellulitis not excluded. Pulsed Doppler interrogation of both testes demonstrates normal low resistance arterial and venous waveforms bilaterally. 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 Current literature suggests that testicular microlithiasis is not a significant independent risk factor for development of testicular carcinoma, and that follow up imaging is not warranted in the absence of other risk factors. Monthly testicular self-examination and annual physical exams are considered appropriate surveillance. If patient has other risk factors for testicular carcinoma, then referral to Urology should be considered. (Reference: DeCastro, et al.: A 5-Year Follow up Study of Asymptomatic Men with Testicular Microlithiasis. J Urol 2008; 179:1420-1423.) 4 mm right epididymal cyst. Heterogeneous left epididymis of questionable significance. No discrete left epididymal mass. No significant increased flow to suggest epididymitis. Skin thickening. Etiology indeterminate. Cellulitis not excluded in the proper clinical setting. No evidence of testicular torsion. No asymmetric blood flow to suggest orchitis. Electronically Signed   By: Lacy Duverney M.D.   On: 02/22/2016 16:19   [place] Art/ven Flow Abd Pelv Doppler  02/22/2016  CLINICAL DATA:  55 year old male with left testicular pain and swelling for 3 days. Initial encounter. EXAM: SCROTAL ULTRASOUND DOPPLER ULTRASOUND OF THE TESTICLES TECHNIQUE: Complete ultrasound examination of the testicles, epididymis, and other scrotal structures was performed. Color and spectral Doppler ultrasound were also utilized to evaluate blood flow to the testicles. COMPARISON:  None. FINDINGS: Measurements: 4.5 x 1.8 x 3.2 cm. Microlithiasis without mass detected. Left testicle Measurements: 3.5 x 1.9 x 2.6 cm. Microlithiasis without mass detected. Right epididymis:  4 mm epididymal head cyst. Left  epididymis: Slightly heterogeneous without discrete mass appear. Hydrocele:  None visualized. Varicocele:  None visualized. Skin thickening.  Etiology indeterminate.  Cellulitis not excluded. Pulsed Doppler interrogation of both testes demonstrates normal low resistance arterial and venous waveforms bilaterally. 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 Current literature suggests that testicular microlithiasis is not a significant independent risk factor for development of testicular carcinoma, and that follow up imaging is not warranted in the absence of other risk factors. Monthly testicular self-examination and annual physical exams are considered appropriate surveillance. If patient has other risk factors for testicular carcinoma, then referral to Urology should be considered. (Reference: DeCastro, et al.: A 5-Year Follow up Study of Asymptomatic Men with Testicular Microlithiasis. J Urol 2008; 179:1420-1423.) 4 mm right epididymal cyst. Heterogeneous left epididymis of questionable significance. No discrete left epididymal mass. No significant increased flow to suggest epididymitis. Skin thickening. Etiology indeterminate. Cellulitis not excluded in the proper clinical setting. No evidence of testicular torsion. No asymmetric blood flow to suggest orchitis. Electronically Signed   By: Lacy Duverney M.D.   On: 02/22/2016 16:19  Time spent:70 minutes Code Status:   FULL Family Communication:  no Family at bedside   Braelin Costlow, DO  [facility] Pager [phone]  If 7PM-7AM, please contact night-coverage [email or web address] Password [email or web address] 02/22/2016, 5:22 PM

## 2016-02-22 NOTE — ED Notes (Signed)
Pt states hx of calcium deposits in scrotum since 2010, says left one has become swollen, red, and painful over the last 3 days. MD reports fever, pt denies fever, no fever in triage, denies N/V, chills.

## 2016-02-22 NOTE — ED Provider Notes (Signed)
Samuel Rowland is a 55 y.o. male, with a history of end-stage renal disease on dialysis, A. fib, anemia, kidney transplant, and hypertension, presenting to the ED with painful, swollen nodules to the scrotum. Patient states he has had these intermittently for the last few years, but these recent ones arose 3 days ago. Patient rates his pain at 6 out of 10, aching, radiating into the left inguinal area. This pain level is after a half milligram of Dilaudid. Patient denies fever/chills, nausea/vomiting, abdominal pain, pain with bowel movements, or any other complaints.  Terance Hart, PA-C HPI:  "55 year old male with ESRD who presents with scrotal pain for the past 3 days. He states he has had calcium deposits in his scrotum since 2010 when he had a kidney transplant. He was seen earlier today at [facility] who advised him to come to the ED for further evaulation and management. He reports a subjective fever and pain is 9/10. Denies dysuria, difficulty urinating, testicular pain, or discharge from penis."   Past Medical History  Diagnosis Date  . Kidney transplant as cause of abnormal reaction or later complication   . Hypertension   . Nausea and vomiting     chronic  . Anemia   . Atrial fibrillation (HCC)   . Heart murmur   . Paroxysmal atrial fibrillation with RVR (HCC)   . GERD (gastroesophageal reflux disease)   . Renal disorder     S/P right transplant 2010  . ESRD (end stage renal disease) on dialysis [facility])     "[facility]; TTS since ~10/2014" (11/12/2015)    Physical Exam  BP 148/92 mmHg  Pulse 78  Temp(Src) 98.3 F (36.8 C) (Oral)  Resp 18  Wt 83.008 kg  SpO2 95%  Physical Exam  Constitutional: He appears well-developed and well-nourished. No distress.  HENT:  Head: Normocephalic and atraumatic.  Eyes: Conjunctivae are normal. Pupils are equal, round, and reactive to light.  Neck: Neck supple.  Cardiovascular: Normal rate, regular rhythm, normal heart sounds and intact  distal pulses.   Pulmonary/Chest: Effort normal and breath sounds normal. No respiratory distress.  Abdominal: Soft. Bowel sounds are normal. There is no tenderness. There is no guarding.  Genitourinary:  Multiple firm nodules, without fluctuance, noted on the surface of the scrotum with surrounding cellulitis. The largest nodule measures approximately 4 cm x 4 cm. No tenderness or swelling to the testes themselves. No tenderness to the epididymitis.  Musculoskeletal: He exhibits no edema or tenderness.  Lymphadenopathy:    He has no cervical adenopathy.       Left: Inguinal adenopathy present.  Tender left inguinal lymph node.  Neurological: He is alert.  Skin: Skin is warm and dry. He is not diaphoretic.  Psychiatric: He has a normal mood and affect. His behavior is normal.  Nursing note and vitals reviewed.    ED Course  Procedures  Results for orders placed or performed during the hospital encounter of 02/22/16  Culture, blood (routine x 2)  Result Value Ref Range   Specimen Description BLOOD RIGHT ANTECUBITAL    Special Requests BOTTLES DRAWN AEROBIC AND ANAEROBIC 5CC    Culture PENDING    Report Status PENDING   Basic metabolic panel  Result Value Ref Range   Sodium 135 135 - 145 mmol/L   Potassium 5.2 (H) 3.5 - 5.1 mmol/L   Chloride 99 (L) 101 - 111 mmol/L   CO2 20 (L) 22 - 32 mmol/L   Glucose, Bld 82 65 -  99 mg/dL   BUN 49 (H) 6 - 20 mg/dL   Creatinine, Ser 16.1010.85 (H) 0.61 - 1.24 mg/dL   Calcium 9.1 8.9 - 96.010.3 mg/dL   GFR calc non Af Amer 5 (L) >60 mL/min   GFR calc Af Amer 5 (L) >60 mL/min   Anion gap 16 (H) 5 - 15  CBC with Differential  Result Value Ref Range   WBC 20.6 (H) 4.0 - 10.5 K/uL   RBC 4.41 4.22 - 5.81 MIL/uL   Hemoglobin 11.3 (L) 13.0 - 17.0 g/dL   HCT 45.435.5 (L) 09.839.0 - 11.952.0 %   MCV 80.5 78.0 - 100.0 fL   MCH 25.6 (L) 26.0 - 34.0 pg   MCHC 31.8 30.0 - 36.0 g/dL   RDW 14.721.0 (H) 82.911.5 - 56.215.5 %   Platelets 216 150 - 400 K/uL   Neutrophils Relative % 83  %   Neutro Abs 17.2 (H) 1.7 - 7.7 K/uL   Lymphocytes Relative 8 %   Lymphs Abs 1.7 0.7 - 4.0 K/uL   Monocytes Relative 8 %   Monocytes Absolute 1.6 (H) 0.1 - 1.0 K/uL   Eosinophils Relative 1 %   Eosinophils Absolute 0.1 0.0 - 0.7 K/uL   Basophils Relative 0 %   Basophils Absolute 0.0 0.0 - 0.1 K/uL  I-Stat CG4 Lactic Acid, ED  Result Value Ref Range   Lactic Acid, Venous 1.16 0.5 - 2.0 mmol/L  I-Stat CG4 Lactic Acid, ED  Result Value Ref Range   Lactic Acid, Venous 0.88 0.5 - 2.0 mmol/L   [place] Scrotum  02/22/2016  CLINICAL DATA:  55 year old male with left testicular pain and swelling for 3 days. Initial encounter. EXAM: SCROTAL ULTRASOUND DOPPLER ULTRASOUND OF THE TESTICLES TECHNIQUE: Complete ultrasound examination of the testicles, epididymis, and other scrotal structures was performed. Color and spectral Doppler ultrasound were also utilized to evaluate blood flow to the testicles. COMPARISON:  None. FINDINGS: Measurements: 4.5 x 1.8 x 3.2 cm. Microlithiasis without mass detected. Left testicle Measurements: 3.5 x 1.9 x 2.6 cm. Microlithiasis without mass detected. Right epididymis:  4 mm epididymal head cyst. Left epididymis: Slightly heterogeneous without discrete mass appear. Hydrocele:  None visualized. Varicocele:  None visualized. Skin thickening.  Etiology indeterminate.  Cellulitis not excluded. Pulsed Doppler interrogation of both testes demonstrates normal low resistance arterial and venous waveforms bilaterally. 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 Current literature suggests that testicular microlithiasis is not a significant independent risk factor for development of testicular carcinoma, and that follow up imaging is not warranted in the absence of other risk factors. Monthly testicular self-examination and annual physical exams are considered appropriate surveillance. If patient has other risk factors for testicular carcinoma, then referral to  Urology should be considered. (Reference: DeCastro, et al.: A 5-Year Follow up Study of Asymptomatic Men with Testicular Microlithiasis. J Urol 2008; 179:1420-1423.) 4 mm right epididymal cyst. Heterogeneous left epididymis of questionable significance. No discrete left epididymal mass. No significant increased flow to suggest epididymitis. Skin thickening. Etiology indeterminate. Cellulitis not excluded in the proper clinical setting. No evidence of testicular torsion. No asymmetric blood flow to suggest orchitis. Electronically Signed   By: Lacy DuverneySteven  Olson M.D.   On: 02/22/2016 16:19   [place] Art/ven Flow Abd Pelv Doppler  02/22/2016  CLINICAL DATA:  55 year old male with left testicular pain and swelling for 3 days. Initial encounter. EXAM: SCROTAL ULTRASOUND DOPPLER ULTRASOUND OF THE TESTICLES TECHNIQUE: Complete ultrasound examination of the testicles, epididymis, and other scrotal structures was performed. Color  and spectral Doppler ultrasound were also utilized to evaluate blood flow to the testicles. COMPARISON:  None. FINDINGS: Measurements: 4.5 x 1.8 x 3.2 cm. Microlithiasis without mass detected. Left testicle Measurements: 3.5 x 1.9 x 2.6 cm. Microlithiasis without mass detected. Right epididymis:  4 mm epididymal head cyst. Left epididymis: Slightly heterogeneous without discrete mass appear. Hydrocele:  None visualized. Varicocele:  None visualized. Skin thickening.  Etiology indeterminate.  Cellulitis not excluded. Pulsed Doppler interrogation of both testes demonstrates normal low resistance arterial and venous waveforms bilaterally. 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 Current literature suggests that testicular microlithiasis is not a significant independent risk factor for development of testicular carcinoma, and that follow up imaging is not warranted in the absence of other risk factors. Monthly testicular self-examination and annual physical exams are considered  appropriate surveillance. If patient has other risk factors for testicular carcinoma, then referral to Urology should be considered. (Reference: DeCastro, et al.: A 5-Year Follow up Study of Asymptomatic Men with Testicular Microlithiasis. J Urol 2008; 179:1420-1423.) 4 mm right epididymal cyst. Heterogeneous left epididymis of questionable significance. No discrete left epididymal mass. No significant increased flow to suggest epididymitis. Skin thickening. Etiology indeterminate. Cellulitis not excluded in the proper clinical setting. No evidence of testicular torsion. No asymmetric blood flow to suggest orchitis. Electronically Signed   By: Lacy Duverney M.D.   On: 02/22/2016 16:19    Medications  HYDROmorphone (DILAUDID) injection 0.5 mg (not administered)  HYDROmorphone (DILAUDID) injection 0.5 mg (0.5 mg Intravenous Given 02/22/16 1514)  vancomycin (VANCOCIN) 1,500 mg in sodium chloride 0.9 % 500 mL IVPB (1,500 mg Intravenous Restarted 02/22/16 2005)  HYDROmorphone (DILAUDID) injection 0.5 mg (0.5 mg Intravenous Given 02/22/16 1817)     MDM  [name] presents with scrotal pain for the last 3 days. 4:08 PM Took patient care handoff report from Terance Hart, PA-C. Plan: Review Ultrasound results, consult neurology, given pain management, possibly admit.  Findings and plan of care discussed with Lyndal Pulley, MD. Dr. Clydene Pugh personally evaluated and examined this patient.  Patient has leukocytosis of 20.6 with no signs of other sepsis criteria. Patient's pain is able to be controlled with moderate management. Scrotal ultrasound shows no discernible abscess. Evidence of cellulitis, correlated clinically, is noted. IV fluids, vancomycin, lactate, blood cultures, and medical admission. 4:52 PM Spoke with Dr. Arbutus Leas, hospitalist, who states that he will come see the patient, admit him, and take care of the transfer to [facility] due to his ESRD. No further instructions.   Filed Vitals:   02/22/16  1221 02/22/16 1704 02/22/16 1730 02/22/16 1935  BP: 161/101  149/98 148/92  Pulse: 83  83 78  Temp: 98.2 F (36.8 C)  98.3 F (36.8 C)   TempSrc: Oral  Oral   Resp: Weight:  83.008 kg    SpO2: 100%  96% 95%          Concepcion Living 02/22/16 2051  Lyndal Pulley, MD 02/23/16 [phone]

## 2016-02-22 NOTE — Patient Instructions (Signed)
     IF you received an x-ray today, you will receive an invoice from [facility]. Please contact [facility] at [phone] with questions or concerns regarding your invoice.   IF you received labwork today, you will receive an invoice from [facility]. Please contact [facility] at [phone] with questions or concerns regarding your invoice.   Our billing staff will not be able to assist you with questions regarding bills from these companies.  You will be contacted with the lab results as soon as they are available. The fastest way to get your results is to activate your My Chart account. Instructions are located on the last page of this paperwork. If you have not heard from us regarding the results in 2 weeks, please contact this office.      

## 2016-02-22 NOTE — Progress Notes (Signed)
Subjective:  This chart was scribed for Arlyss Queen MD, by Tamsen Roers, at [facility].  This patient was seen in room 1  and the patient's care was started at 11:37 AM.   Chief Complaint  Patient presents with  . scrotum mass    x 2 days     Patient ID: Samuel Rowland, male    DOB: 1961-10-24, 55 y.o.   MRN: [id number]  HPI HPI Comments: Samuel Rowland is a 55 y.o. male who presents to the [facility] complaining of a scrotum mass onset three days ago.  He has told his nephrologist who would like to monitor it, he has not yet seen a urologist or dermatologist.  Patient states that he had calcium deposits in his scrotum in the past (when he had kidney transplant which caused his bladder to leak and led to his testicles swelling).  Patient started dialysis in 2002, had a transplant in 2010 and he is back on dialysis now.  He is currently going 3 days per week.     Patient Active Problem List   Diagnosis Date Noted  . Abnormal nuclear stress test 12/15/2015  . ESRD (end stage renal disease) ([place]) 12/15/2015  . Other hypertrophic cardiomyopathy ([place]) 11/26/2015  . Atrial fibrillation ([place]) [I48.91] 11/17/2015  . Long term (current) use of anticoagulants [Z79.01] 11/17/2015  . Paroxysmal atrial fibrillation with RVR ([place]) 11/12/2015  . Anemia of chronic kidney failure 11/12/2015  . 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 Acute kidney injury ([place])   . Essential hypertension   . Metabolic acidosis 97/58/8325  . Protein-calorie malnutrition, severe ([place]) 09/20/2014  . AKI (acute kidney injury) ([place]) 09/20/2014  . Renal transplant recipient 09/20/2014  . Prostatitis, acute 09/19/2014   Past Medical History  Diagnosis Date  . Kidney transplant as cause of abnormal reaction or later complication   . Hypertension   . Nausea and vomiting     chronic  . Anemia   . Atrial fibrillation ([place])   .  Heart murmur   . Paroxysmal atrial fibrillation with RVR ([place])   . GERD (gastroesophageal reflux disease)   . Renal disorder     S/P right transplant 2010  . ESRD (end stage renal disease) on dialysis [facility])     "[facility]; TTS since ~10/2014" (11/12/2015)   Past Surgical History  Procedure Laterality Date  . Kidney transplant Right 09/2009    "[facility]"  . Anterior cervical decomp/discectomy fusion  01/1999    C5-6   . Cardioversion    . Back surgery    . Colonoscopy w/ polypectomy  10/2009  . Esophagogastroduodenoscopy  10/2009  . Renal biopsy  2015  . Cardiac catheterization N/A 12/23/2015    Procedure: Left Heart Cath and Coronary Angiography;  Surgeon: Peter M Martinique, MD;  Location: [facility];  Service: Cardiovascular;  Laterality: N/A;   No Known Allergies Prior to Admission medications   Medication Sig Start Date End Date Taking? Authorizing Provider  amiodarone (PACERONE) 400 MG tablet Take 1 tablet (400 mg total) by mouth daily. 12/15/15  Yes Peter M Martinique, MD  cinacalcet (SENSIPAR) 30 MG tablet Take 30 mg by mouth 2 (two) times daily with a meal.  08/26/14  Yes Historical Provider, MD  metoprolol tartrate (LOPRESSOR) 25 MG tablet Take 1 tablet (25 mg total) by mouth 2 (two) times daily. 11/13/15  Yes Jule Ser, DO  predniSONE (DELTASONE) 5 MG tablet Take 15 mg by mouth daily with breakfast.    Yes Historical Provider, MD  ranitidine (ZANTAC) 150 MG tablet Take 150 mg by mouth daily as needed for heartburn.   Yes Historical Provider, MD  sevelamer carbonate (RENVELA) 2.4 G PACK Take 4.8 g by mouth 3 (three) times daily with meals.    Yes Historical Provider, MD  warfarin (COUMADIN) 2 MG tablet Take 2 mg by mouth daily.   Yes Historical Provider, MD   Social History   Social History  . Marital Status: Legally Separated    Spouse Name: N/A  . Number of Children: N/A  . Years of Education: N/A   Occupational History  . Not on file.   Social  History Main Topics  . Smoking status: Former Smoker -- 22 years    Types: Cigars  . Smokeless tobacco: Never Used     Comment: "stopped smoking in 1998"  . Alcohol Use: No  . Drug Use: No  . Sexual Activity: Not Currently   Other Topics Concern  . Not on file   Social History Narrative       Review of Systems  Constitutional: Negative for fever and chills.  Eyes: Negative for pain, redness and itching.  Respiratory: Negative for cough and shortness of breath.   Gastrointestinal: Negative for nausea and vomiting.  Genitourinary: Positive for scrotal swelling.  Musculoskeletal: Negative for neck pain and neck stiffness.  Neurological: Negative for syncope and speech difficulty.       Objective:   Physical Exam  Filed Vitals:   02/22/16 1118 02/22/16 1119 02/22/16 1128  BP: 156/95 157/93 168/94  Pulse: 83 82   Temp: 99.1 F (37.3 C)    Resp: 16    Height: 5' 10"  (1.778 m)    Weight: 183 lb (83.008 kg)     CONSTITUTIONAL: Well developed/well nourished HEAD: Normocephalic/atraumatic GU:He has egg size calcific mass on the left side of the scrotum with a small area of spontaneous serous drainage, there is redness along the left side of the scrotum with sig tenderness in the left inguinal area.  NEURO: Pt is awake/alert/appropriate, moves all extremitiesx4.  No facial droop.   EXTREMITIES: pulses normal/equal, full ROM SKIN: warm, color normal PSYCH: no abnormalities of mood noted, alert and oriented to situation       Assessment & Plan:  Patient has a large calcified mass left side of the scrotum. This area has become infected and there is redness along the left side of the scrotum extending into the left inguinal area. I did not feel this could be managed adequately here. I called and spoke with triage at [facility] emergency room. Patient understands he is to go there for evaluation and treatment.I personally performed the services described in this documentation,  which was scribed in my presence. The recorded information has been reviewed and is accurate.    Hemodialysis patient presents with a large infected calcific mass left side of the scrotum with involvement of the left inguinal area. He is sent to [facility] for further management.  Triage has been called at [facility].   Darlyne Russian, MD

## 2016-02-22 NOTE — ED Provider Notes (Signed)
CSN: [id number]     Arrival date & time 02/22/16  1214 History   First MD Initiated Contact with Patient 02/22/16 1419     Chief Complaint  Patient presents with  . Groin Swelling  . Groin Pain   HPI Comments: 55 year old male with ESRD who presents with scrotal pain for the past 3 days. He states he has had calcium deposits in his scrotum since 2010 when he had a kidney transplant. [facility] advised him to come to the ED for further evaulation and management. He reports a subjective fever and pain is 9/10. Denies dysuria, difficulty urinating, testicular pain, or discharge from penis.     Patient is a 55 y.o. male presenting with groin pain.  Groin Pain Associated symptoms include a fever.    Past Medical History  Diagnosis Date  . Kidney transplant as cause of abnormal reaction or later complication   . Hypertension   . Nausea and vomiting     chronic  . Anemia   . Atrial fibrillation (HCC)   . Heart murmur   . Paroxysmal atrial fibrillation with RVR (HCC)   . GERD (gastroesophageal reflux disease)   . Renal disorder     S/P right transplant 2010  . ESRD (end stage renal disease) on dialysis [facility](HCC)     [facility] since ~10/2014" (11/12/2015)   Past Surgical History  Procedure Laterality Date  . [facility]  . Anterior cervical decomp/discectomy fusion  01/1999    C5-6   . Cardioversion    . Back surgery    . Colonoscopy w/ polypectomy  10/2009  . Esophagogastroduodenoscopy  10/2009  . Renal biopsy  2015  . Cardiac catheterization N/A 12/23/2015    Procedure: Left Heart Cath and Coronary Angiography;  Surgeon: Peter M SwazilandJordan, MD;  [facility];  Service: Cardiovascular;  Laterality: N/A;   Family History  Problem Relation Age of Onset  . Hypertension Father    Social History  Substance Use Topics  . Smoking status: Former Smoker -- 22 years    Types: Cigars  . Smokeless  tobacco: Never Used     Comment: "stopped smoking in 1998"  . Alcohol Use: No    Review of Systems  Constitutional: Positive for fever.  Genitourinary: Positive for scrotal swelling. Negative for dysuria, urgency, frequency, hematuria, flank pain, discharge, penile swelling, enuresis, difficulty urinating, genital sores, penile pain and testicular pain.  All other systems reviewed and are negative.   Allergies  Review of patient's allergies indicates no known allergies.  Home Medications   Prior to Admission medications   Medication Sig Start Date End Date Taking? Authorizing Provider  amiodarone (PACERONE) 400 MG tablet Take 1 tablet (400 mg total) by mouth daily. 12/15/15  Yes Peter M SwazilandJordan, MD  cinacalcet (SENSIPAR) 30 MG tablet Take 30 mg by mouth daily with supper.  08/26/14  Yes Historical Provider, MD  metoprolol tartrate (LOPRESSOR) 25 MG tablet Take 1 tablet (25 mg total) by mouth 2 (two) times daily. 11/13/15  Yes Gwynn BurlyAndrew Wallace, DO  predniSONE (DELTASONE) 10 MG tablet Take 10 mg by mouth daily.   Yes Historical Provider, MD  sevelamer carbonate (RENVELA) 800 MG tablet Take 800-1,600 mg by mouth See admin instructions. Takes 1600mg  with each meal and 800mg  with each snack   Yes Historical Provider, MD  warfarin (COUMADIN) 2 MG tablet Take 2 mg  by mouth daily.   Yes Historical Provider, MD   BP 161/101 mmHg  Pulse 83  Temp(Src) 98.2 F (36.8 C) (Oral)  Resp 18  SpO2 100%   Physical Exam  Constitutional: He is oriented to person, place, and time. He appears well-developed and well-nourished. No distress.  HENT:  Head: Normocephalic and atraumatic.  Eyes: Conjunctivae are normal. Pupils are equal, round, and reactive to light. Right eye exhibits no discharge. Left eye exhibits no discharge. No scleral icterus.  Neck: Normal range of motion.  Pulmonary/Chest: Effort normal. No respiratory distress.  Genitourinary:  Chaperone present during exam. Left inguinal  lymphadenopathy. No inguinal hernia noted. Normal circumcised penis free of lesion, rash, or discharge. Testicles nontender with normal lie. Egg-shaped mass with marble sized masses throughout the scrotum which are tender to palpation. Mass is erythematous. Skin is not open.   Neurological: He is alert and oriented to person, place, and time.  Skin: Skin is warm and dry.  Psychiatric: He has a normal mood and affect.    ED Course  Procedures (including critical care time) Labs Review Labs Reviewed  BASIC METABOLIC PANEL - Abnormal; Notable for the following:    Potassium 5.2 (*)    Chloride 99 (*)    CO2 20 (*)    BUN 49 (*)    Creatinine, Ser 10.85 (*)    GFR calc non Af Amer 5 (*)    GFR calc Af Amer 5 (*)    Anion gap 16 (*)    All other components within normal limits  CBC WITH DIFFERENTIAL/PLATELET - Abnormal; Notable for the following:    WBC 20.6 (*)    Hemoglobin 11.3 (*)    HCT 35.5 (*)    MCH 25.6 (*)    RDW 21.0 (*)    Neutro Abs 17.2 (*)    Monocytes Absolute 1.6 (*)    All other components within normal limits    Imaging Review No results found. I have personally reviewed and evaluated these images and lab results as part of my medical decision-making.   EKG Interpretation None      MDM   Final diagnoses:  None   55 year old male who presents for possible infected calcific mass in his scrotum. [place] of scrotum and doppler pending. Labs obtained which showed WBC count of 20. Pain medicine given.  Pt signed out to [facility] PA-C. Plan is to consult Urology and await [place] results.    Bethel Born, PA-C 02/22/16 1608  Lyndal Pulley, MD 02/22/16 [phone]

## 2016-02-23 LAB — MRSA PCR SCREENING: MRSA BY PCR: POSITIVE — AB

## 2016-02-23 LAB — RENAL FUNCTION PANEL
Albumin: 2.9 g/dL — ABNORMAL LOW (ref 3.5–5.0)
Anion gap: 20 — ABNORMAL HIGH (ref 5–15)
BUN: 64 mg/dL — AB (ref 6–20)
CALCIUM: 9.4 mg/dL (ref 8.9–10.3)
CHLORIDE: 95 mmol/L — AB (ref 101–111)
CO2: 19 mmol/L — AB (ref 22–32)
CREATININE: 13.45 mg/dL — AB (ref 0.61–1.24)
GFR calc Af Amer: 4 mL/min — ABNORMAL LOW (ref >60)
GFR, EST NON AFRICAN AMERICAN: 4 mL/min — AB (ref >60)
Glucose, Bld: 112 mg/dL — ABNORMAL HIGH (ref 65–99)
Phosphorus: 11.4 mg/dL — ABNORMAL HIGH (ref 2.5–4.6)
Potassium: 5.9 mmol/L — ABNORMAL HIGH (ref 3.5–5.1)
SODIUM: 134 mmol/L — AB (ref 135–145)

## 2016-02-23 LAB — CBC
HCT: 35 % — ABNORMAL LOW (ref 39.0–52.0)
Hemoglobin: 10.8 g/dL — ABNORMAL LOW (ref 13.0–17.0)
MCH: 25.1 pg — AB (ref 26.0–34.0)
MCHC: 30.9 g/dL (ref 30.0–36.0)
MCV: 81.4 fL (ref 78.0–100.0)
PLATELETS: 180 10*3/uL (ref 150–400)
RBC: 4.3 MIL/uL (ref 4.22–5.81)
RDW: 21.5 % — AB (ref 11.5–15.5)
WBC: 13.8 10*3/uL — ABNORMAL HIGH (ref 4.0–10.5)

## 2016-02-23 LAB — PROTIME-INR
INR: 2.5 — ABNORMAL HIGH (ref 0.00–1.49)
Prothrombin Time: 26.7 seconds — ABNORMAL HIGH (ref 11.6–15.2)

## 2016-02-23 LAB — HEPATITIS B SURFACE ANTIGEN: HEP B S AG: NEGATIVE

## 2016-02-23 MED ORDER — MUPIROCIN 2 % EX OINT
1.0000 "application " | TOPICAL_OINTMENT | Freq: Two times a day (BID) | CUTANEOUS | Status: DC
  Administered 2016-02-24 – 2016-02-26 (×6): 1 via NASAL
  Filled 2016-02-23 (×2): qty 22

## 2016-02-23 MED ORDER — VANCOMYCIN HCL IN DEXTROSE 1-5 GM/200ML-% IV SOLN
1000.0000 mg | INTRAVENOUS | Status: DC
  Administered 2016-02-23 – 2016-02-25 (×2): 1000 mg via INTRAVENOUS
  Filled 2016-02-23 (×2): qty 200

## 2016-02-23 MED ORDER — CINACALCET HCL 30 MG PO TABS
60.0000 mg | ORAL_TABLET | Freq: Every day | ORAL | Status: DC
  Administered 2016-02-23: 30 mg via ORAL
  Administered 2016-02-24 – 2016-02-25 (×2): 60 mg via ORAL
  Filled 2016-02-23 (×2): qty 2

## 2016-02-23 MED ORDER — CINACALCET HCL 30 MG PO TABS
30.0000 mg | ORAL_TABLET | Freq: Every day | ORAL | Status: DC
  Administered 2016-02-24 – 2016-02-26 (×2): 30 mg via ORAL
  Filled 2016-02-23 (×2): qty 1

## 2016-02-23 MED ORDER — RENA-VITE PO TABS
1.0000 | ORAL_TABLET | Freq: Every day | ORAL | Status: DC
  Administered 2016-02-23 – 2016-02-25 (×3): 1 via ORAL
  Filled 2016-02-23 (×3): qty 1

## 2016-02-23 MED ORDER — DARBEPOETIN ALFA 40 MCG/0.4ML IJ SOSY: 40.0000 ug | PREFILLED_SYRINGE | INTRAMUSCULAR | Status: DC

## 2016-02-23 MED ORDER — VANCOMYCIN HCL IN DEXTROSE 1-5 GM/200ML-% IV SOLN
INTRAVENOUS | Status: AC
  Administered 2016-02-23: 1000 mg via INTRAVENOUS
  Filled 2016-02-23: qty 200

## 2016-02-23 MED ORDER — CHLORHEXIDINE GLUCONATE CLOTH 2 % EX PADS
6.0000 | MEDICATED_PAD | Freq: Every day | CUTANEOUS | Status: DC
  Administered 2016-02-23 – 2016-02-26 (×3): 6 via TOPICAL

## 2016-02-23 MED ORDER — VANCOMYCIN HCL IN DEXTROSE 750-5 MG/150ML-% IV SOLN
750.0000 mg | INTRAVENOUS | Status: DC
  Filled 2016-02-23: qty 150

## 2016-02-23 MED ORDER — OXYCODONE-ACETAMINOPHEN 5-325 MG PO TABS
1.0000 | ORAL_TABLET | ORAL | Status: DC | PRN
  Administered 2016-02-23 – 2016-02-24 (×5): 2 via ORAL
  Filled 2016-02-23 (×5): qty 2

## 2016-02-23 NOTE — Consult Note (Signed)
Samuel Rowland is an 55 y.o. male referred by Dr Joseph ArtWoods   Chief Complaint: ESRD, anemia, Sec HPTH HPI: 55yo M admitted yest for scrotal cellulitis.  [facility] HD [facility].  Last HD Sat.  Has had issues with scrotal "deposits" that he has been told is calcium.  This all occurred after scrotal swelling following renal tx in 2010.  Says he was on renvela but switched to Ca acetate and is on Vit D?  Past Medical History  Diagnosis Date  . Kidney transplant as cause of abnormal reaction or later complication   . Hypertension   . Nausea and vomiting     chronic  . Anemia   . Atrial fibrillation (HCC)   . Heart murmur   . Paroxysmal atrial fibrillation with RVR (HCC)   . GERD (gastroesophageal reflux disease)   . Renal disorder     S/P right transplant 2010  . ESRD (end stage renal disease) on dialysis [facility](HCC)     [facility] since ~10/2014" (11/12/2015)  FH neg for renal disease  Past Surgical History  Procedure Laterality Date  . [facility]  . Anterior cervical decomp/discectomy fusion  01/1999    C5-6   . Cardioversion    . Back surgery    . Colonoscopy w/ polypectomy  10/2009  . Esophagogastroduodenoscopy  10/2009  . Renal biopsy  2015  . Cardiac catheterization N/A 12/23/2015    Procedure: Left Heart Cath and Coronary Angiography;  Surgeon: Peter M SwazilandJordan, MD;  [facility];  Service: Cardiovascular;  Laterality: N/A;    Family History  Problem Relation Age of Onset  . Hypertension Father    Social History:  reports that he has quit smoking. His smoking use included Cigars. He has never used smokeless tobacco. He reports that he does not drink alcohol or use illicit drugs. Lives with wife  Allergies: No Known Allergies  Medications Prior to Admission  Medication Sig Dispense Refill  . amiodarone (PACERONE) 400 MG tablet Take 1 tablet (400 mg total) by mouth daily. 30 tablet 11  .  cinacalcet (SENSIPAR) 30 MG tablet Take 30 mg by mouth daily with supper.     . metoprolol tartrate (LOPRESSOR) 25 MG tablet Take 1 tablet (25 mg total) by mouth 2 (two) times daily. 60 tablet 0  . predniSONE (DELTASONE) 10 MG tablet Take 10 mg by mouth daily.    . sevelamer carbonate (RENVELA) 800 MG tablet Take 800-1,600 mg by mouth See admin instructions. Takes 1600mg  with each meal and 800mg  with each snack    . warfarin (COUMADIN) 2 MG tablet Take 2 mg by mouth daily.       Lab Results: UA: ND   Recent Labs  02/22/16 1509 02/23/16 0529  WBC 20.6* 13.8*  HGB 11.3* 10.8*  HCT 35.5* 35.0*  PLT 216 180   BMET  Recent Labs  02/22/16 1509  NA 135  K 5.2*  CL 99*  CO2 20*  GLUCOSE 82  BUN 49*  CREATININE 10.85*  CALCIUM 9.1   LFT No results for input(s): PROT, ALBUMIN, AST, ALT, ALKPHOS, BILITOT, BILIDIR, IBILI in the last 72 hours. Koreas Scrotum  02/22/2016  CLINICAL DATA:  55 year old male with left testicular pain and swelling for 3 days. Initial encounter. EXAM: SCROTAL ULTRASOUND DOPPLER ULTRASOUND OF THE TESTICLES TECHNIQUE: Complete ultrasound examination of the testicles, epididymis, and other scrotal structures was performed. Color  and spectral Doppler ultrasound were also utilized to evaluate blood flow to the testicles. COMPARISON:  None. FINDINGS: Measurements: 4.5 x 1.8 x 3.2 cm. Microlithiasis without mass detected. Left testicle Measurements: 3.5 x 1.9 x 2.6 cm. Microlithiasis without mass detected. Right epididymis:  4 mm epididymal head cyst. Left epididymis: Slightly heterogeneous without discrete mass appear. Hydrocele:  None visualized. Varicocele:  None visualized. Skin thickening.  Etiology indeterminate.  Cellulitis not excluded. Pulsed Doppler interrogation of both testes demonstrates normal low resistance arterial and venous waveforms bilaterally. 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 Current literature suggests that  testicular microlithiasis is not a significant independent risk factor for development of testicular carcinoma, and that follow up imaging is not warranted in the absence of other risk factors. Monthly testicular self-examination and annual physical exams are considered appropriate surveillance. If patient has other risk factors for testicular carcinoma, then referral to Urology should be considered. (Reference: DeCastro, et al.: A 5-Year Follow up Study of Asymptomatic Men with Testicular Microlithiasis. J Urol 2008; 179:1420-1423.) 4 mm right epididymal cyst. Heterogeneous left epididymis of questionable significance. No discrete left epididymal mass. No significant increased flow to suggest epididymitis. Skin thickening. Etiology indeterminate. Cellulitis not excluded in the proper clinical setting. No evidence of testicular torsion. No asymmetric blood flow to suggest orchitis. Electronically Signed   By: Lacy Duverney M.D.   On: 02/22/2016 16:19   [place] Art/ven Flow Abd Pelv Doppler  02/22/2016  CLINICAL DATA:  55 year old male with left testicular pain and swelling for 3 days. Initial encounter. EXAM: SCROTAL ULTRASOUND DOPPLER ULTRASOUND OF THE TESTICLES TECHNIQUE: Complete ultrasound examination of the testicles, epididymis, and other scrotal structures was performed. Color and spectral Doppler ultrasound were also utilized to evaluate blood flow to the testicles. COMPARISON:  None. FINDINGS: Measurements: 4.5 x 1.8 x 3.2 cm. Microlithiasis without mass detected. Left testicle Measurements: 3.5 x 1.9 x 2.6 cm. Microlithiasis without mass detected. Right epididymis:  4 mm epididymal head cyst. Left epididymis: Slightly heterogeneous without discrete mass appear. Hydrocele:  None visualized. Varicocele:  None visualized. Skin thickening.  Etiology indeterminate.  Cellulitis not excluded. Pulsed Doppler interrogation of both testes demonstrates normal low resistance arterial and venous waveforms bilaterally.  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 Current literature suggests that testicular microlithiasis is not a significant independent risk factor for development of testicular carcinoma, and that follow up imaging is not warranted in the absence of other risk factors. Monthly testicular self-examination and annual physical exams are considered appropriate surveillance. If patient has other risk factors for testicular carcinoma, then referral to Urology should be considered. (Reference: DeCastro, et al.: A 5-Year Follow up Study of Asymptomatic Men with Testicular Microlithiasis. J Urol 2008; 179:1420-1423.) 4 mm right epididymal cyst. Heterogeneous left epididymis of questionable significance. No discrete left epididymal mass. No significant increased flow to suggest epididymitis. Skin thickening. Etiology indeterminate. Cellulitis not excluded in the proper clinical setting. No evidence of testicular torsion. No asymmetric blood flow to suggest orchitis. Electronically Signed   By: Lacy Duverney M.D.   On: 02/22/2016 16:19    ROS: No change in vision No SOB No CP No abd pain No diarrhea Pain in scrotum No arthritic CO No new neuropathic sxs  PHYSICAL EXAM: Blood pressure 148/88, pulse 81, temperature 98.4 F (36.9 C), temperature source Oral, resp. rate 18, height  (1.753 m), weight 82.8 kg (182 lb 8.7 oz), SpO2 99 %. HEENT: PERRLA  EOMI NECK:No JVD LUNGS:CTA CARDIAC:RRR  ABD:+ BS NTND  Failed Tx RLQ EXT:No CCE NEURO:CNi Ox3 no asterixis GU: induration and nodular formation of scrotum with tenderness  Assessment: 1. ESRD 2. ? Calciphylaxis 3. Anemia 4. Sec HPTH 5. Scrotal cellulitis PLAN: 1. Need to get more info from his unit regarding meds 2. HD today 3. Avoid Ca based binders and Vit D for now 4. AB for scrotal cellulitis 5. Rec urology see pt   Larry Alcock T 02/23/2016, 12:30 PM

## 2016-02-23 NOTE — Procedures (Signed)
Pt seen on HD.  Ap 210 Vp 230  BFR 350.  Try to pull 4L but if BP cannot tolerate then will decrease and adjust DW.

## 2016-02-23 NOTE — Progress Notes (Signed)
[facility] PROGRESS NOTE  Samuel Rowland ZOX:[id number] DOB: 1961/03/11 DOA: 02/22/2016 PCP: Pcp Not In System Admit HPI / Brief Narrative: 55 year old male PMHx  ESRD (TTS), paroxysmal atrial fibrillation, HTN,   Presented to the emergency department with 3 day history of increasing erythema, pain, and edema of his left scrotum. The patient states that He has known calcium deposits in his scrotal area for the past year. He denies any recent penile drainage or pen wound drainage from his scrotum. He has some chronic pain in his scrotum, but in the past 3 days he has had worsening symptoms as noted above. He has subjective fevers and chills He presented to the urgent care on the day of admission, and was sent to the emergency department for further evaluation. 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 He is sexually active and monogamous.  In the emergency department, the patient had a low-grade temperature of 99.1, but he was hemodynamically stable. Ultrasound of his scrotum revealed bilateral microlithiasis, a right-sided 4 mm epididymal cyst, and heterogenous left epididymis without suggestion of epididymitis.There was nonspecific skin thickening but no evidence of testicular torsion. WBC was 20.6. Potassium is 5.2. Serum creatinine 10.85. The patient was given vancomycin after blood cultures were obtained. 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 -Continue  prednisone  Hyperkalemia -give 1 dose Kayexalate -Patient to HD today  Paroxysmal atrial fibrillation -presently in sinus rhythm -rate controlled -continue amiodarone -continue metoprolol tartrate -Continue warfarin  Hypertension -continue metoprolol tartrate    Code Status: Full Family Communication: None Disposition Plan: Resolution cellulitis   Consultants: Nephrology  Procedures: NA  Cultures 4/3 blood right AC/hand NGTD 4/4 MRSA by PCR positive   Antibiotics: Vancomycin 4/3>>    DVT prophylaxis Coumadin    Objective: Filed Vitals:   02/23/16 1630 02/23/16 1700 02/23/16 1728 02/23/16 1759  BP: 116/64 126/70 129/69 123/65  Pulse: 65 65 64 63  Temp:      TempSrc:      Resp: Height:      Weight:      SpO2:       No intake or output data in the 24 hours ending 02/23/16 1812 Filed Weights   02/22/16 1704 02/22/16 2223 02/23/16 1429  Weight: 83.008 kg (183 lb) 82.8 kg (182 lb 8.7 oz) 82.6 kg (182 lb 1.6 oz)     Exam: General: A/O 4, NAD No acute respiratory distress Eyes: Negative headache, double vision,negative scleral hemorrhage ENT: Negative Runny nose, negative gingival bleeding, Neck:  Negative scars, masses, torticollis, lymphadenopathy, JVD Lungs: Clear to auscultation bilaterally without wheezes or crackles Cardiovascular: Regular rate and rhythm without murmur gallop or rub normal S1 and S2 Abdomen:negative abdominal pain, negative dysphagia, nondistended, positive soft, bowel sounds, no rebound, no ascites, no appreciable mass Extremities: No significant cyanosis, clubbing, or edema bilateral lower extremities Psychiatric:  Negative depression, negative anxiety, negative fatigue, negative mania  Neurologic:  Cranial nerves II through XII intact, tongue/uvula midline, all extremities  muscle strength 5/5, sensation intact throughout,  negative dysarthria, negative expressive aphasia, negative receptive aphasia.   Genitourinary; swollen, erythematous scrotal, painful to palpation    Data Reviewed: Basic Metabolic Panel:  Recent Labs Lab 02/22/16 1509 02/23/16 1458  NA 135 134*  K 5.2* 5.9*  CL 99* 95*  CO2 20* 19*  GLUCOSE 82 112*  BUN 49* 64*  CREATININE 10.85* 13.45*  CALCIUM 9.1 9.4  PHOS  --  11.4*   Liver Function Tests:  Recent Labs Lab 02/23/16 1458  ALBUMIN 2.9*   No results for input(s): LIPASE, AMYLASE in the last 168 hours. No results for input(s): AMMONIA in the last 168 hours. CBC:  Recent Labs Lab 02/22/16 1509 02/23/16 0529  WBC 20.6* 13.8*  NEUTROABS 17.2*  --   HGB 11.3* 10.8*  HCT 35.5* 35.0*  MCV 80.5 81.4  PLT 216 180   Cardiac Enzymes: No results for input(s): CKTOTAL, CKMB, CKMBINDEX, TROPONINI in the last 168 hours. BNP (last 3 results) No results for input(s): BNP in the last 8760 hours.  ProBNP (last 3 results) No results for input(s): PROBNP in the last 8760 hours.  CBG: No results for input(s): GLUCAP in the last 168 hours.  Recent Results (from the past 240 hour(s))  Culture, blood (routine x 2)     Status: None (Preliminary result)   Collection Time: 02/22/16  4:45 PM  Result Value Ref Range Status   Specimen Description BLOOD RIGHT ANTECUBITAL  Final   Special Requests BOTTLES DRAWN AEROBIC AND ANAEROBIC 5CC  Final   Culture   Final    NO GROWTH < 24 HOURS Performed at [facility]    Report Status PENDING  Incomplete  Culture, blood (routine x 2)     Status: None (Preliminary result)   Collection Time: 02/22/16  4:54 PM  Result Value Ref Range Status   Specimen Description BLOOD RIGHT HAND  Final   Special Requests BOTTLES DRAWN AEROBIC AND ANAEROBIC 5CC  Final   Culture   Final    NO GROWTH < 24 HOURS Performed at [facility]    Report Status PENDING  Incomplete  MRSA PCR Screening     Status: Abnormal   Collection Time: 02/23/16  1:30 AM  Result Value Ref Range Status   MRSA by PCR POSITIVE (A)  NEGATIVE Final    Comment:        The GeneXpert MRSA Assay (FDA approved for NASAL specimens only), is one component of a comprehensive MRSA colonization surveillance program. It is not intended to diagnose MRSA infection nor to guide or monitor treatment for MRSA infections. RESULT CALLED TO, READ BACK BY AND VERIFIED WITH: K BUBON,RN AT 4098 02/23/16 BY L BENFIELD RESULT CALLED TO, READ BACK BY AND VERIFIED WITH: A. STOPHILL RN 6:56 02/23/16 [place])      Studies: US Scrotum  02/22/2016  CLINICAL DATA:  55 year old male with left testicular pain and swelling for 3 days. Initial encounter. EXAM: SCROTAL ULTRASOUND DOPPLER ULTRASOUND OF THE TESTICLES TECHNIQUE: Complete ultrasound examination of the testicles, epididymis, and other scrotal structures was performed. Color and spectral Doppler ultrasound were also utilized to evaluate blood flow to the testicles. COMPARISON:  None. FINDINGS: Measurements: 4.5 x 1.8 x 3.2 cm. Microlithiasis without mass detected. Left testicle Measurements: 3.5 x 1.9 x 2.6 cm. Microlithiasis without mass detected. Right epididymis:  4 mm epididymal head cyst. Left epididymis: Slightly heterogeneous without discrete mass appear. Hydrocele:  None visualized. Varicocele:  None visualized. Skin thickening.  Etiology indeterminate.  Cellulitis not excluded. Pulsed Doppler interrogation of both testes demonstrates normal low resistance arterial and venous waveforms bilaterally. 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 Current literature suggests that testicular microlithiasis is not a significant independent risk factor for development of testicular carcinoma, and that follow up imaging is not warranted in the absence of other risk factors. Monthly testicular self-examination and annual physical exams are considered appropriate surveillance. If patient has other risk factors for testicular carcinoma, then referral to Urology should be  considered. (Reference: DeCastro, et al.: A 5-Year Follow up Study of Asymptomatic Men with Testicular Microlithiasis. J Urol 2008; 179:1420-1423.) 4 mm right epididymal cyst. Heterogeneous left epididymis of questionable significance. No discrete left epididymal mass. No significant increased flow to suggest epididymitis. Skin thickening. Etiology indeterminate. Cellulitis not excluded in the proper clinical setting. No evidence of testicular torsion. No asymmetric blood flow to suggest orchitis. Electronically Signed   By: Lacy DuverneySteven  Olson M.D.   On: 02/22/2016 16:19   [place] Art/ven Flow Abd Pelv Doppler  02/22/2016  CLINICAL DATA:  55 year old male with left testicular pain and swelling for 3 days. Initial encounter. EXAM: SCROTAL ULTRASOUND DOPPLER ULTRASOUND OF THE TESTICLES TECHNIQUE: Complete ultrasound examination of the testicles, epididymis, and other scrotal structures was performed. Color and spectral Doppler ultrasound were also utilized to evaluate blood flow to the testicles. COMPARISON:  None. FINDINGS: Measurements: 4.5 x 1.8 x 3.2 cm. Microlithiasis without mass detected. Left testicle Measurements: 3.5 x 1.9 x 2.6 cm. Microlithiasis without mass detected. Right epididymis:  4 mm epididymal head cyst. Left epididymis: Slightly heterogeneous without discrete mass appear. Hydrocele:  None visualized. Varicocele:  None visualized. Skin thickening.  Etiology indeterminate.  Cellulitis not excluded. Pulsed Doppler interrogation of both testes demonstrates normal low resistance arterial and venous waveforms bilaterally. 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 Current literature suggests that testicular microlithiasis is not a significant independent risk factor for development of testicular carcinoma, and that follow up imaging is not warranted in the absence of other risk factors. Monthly testicular self-examination and annual physical exams are considered appropriate  surveillance. If patient has other risk factors for testicular carcinoma, then referral to Urology should be considered. (Reference: DeCastro, et al.: A 5-Year Follow up Study of Asymptomatic Men with Testicular Microlithiasis. J Urol 2008; 179:1420-1423.) 4 mm right epididymal cyst. Heterogeneous left epididymis of questionable significance. No discrete left epididymal mass. No significant increased flow to suggest epididymitis. Skin thickening. Etiology indeterminate. Cellulitis not excluded in the proper clinical setting. No evidence of testicular torsion. No asymmetric blood flow to suggest orchitis. Electronically Signed   By: Lacy DuverneySteven  Olson M.D.   On: 02/22/2016 16:19    Scheduled Meds: . amiodarone  400 mg Oral Daily  . Chlorhexidine Gluconate Cloth  6 each Topical Q0600  . [START ON 02/24/2016] cinacalcet  30 mg Oral Q breakfast  . cinacalcet  60 mg Oral Q supper  . [START ON 03/01/2016] darbepoetin (ARANESP) injection - DIALYSIS  40 mcg Intravenous Q Tue-HD  . metoprolol tartrate  25 mg Oral BID  . multivitamin  1 tablet Oral QHS  . mupirocin ointment  1 application Nasal BID  . predniSONE  10 mg Oral Daily  . sevelamer carbonate  1,600 mg Oral TID WC  . vancomycin  1,000 mg Intravenous Q T,Th,Sa-HD  . warfarin  2 mg Oral q1800  . Warfarin - Pharmacist Dosing Inpatient   Does not apply q1800   Continuous Infusions:   Active Problems:  Renal transplant recipient   Atrial fibrillation (HCC) [I48.91]   Long term (current) use of anticoagulants [Z79.01]   ESRD (end stage renal disease) (HCC)   Cellulitis, scrotum    Time spent: 20 minutes    Adib Wahba J  [facility] Pager [phone]. If 7PM-7AM, please contact night-coverage at [email or web address], password [facility] 02/23/2016, 6:12 PM  LOS: 1 day    Care during the described time interval was provided by me .  I have reviewed this patient's available data, including medical history, events of note, physical examination, and all  test results as part of my evaluation. I have personally reviewed and interpreted all radiology studies.   Carolyne Littles, MD [phone] Pager

## 2016-02-23 NOTE — Progress Notes (Signed)
Pharmacy Antibiotic Note  Samuel Rowland is a 55 y.o. male admitted on 02/22/2016 with scrotal pain and swelling.  He reports having calcium deposits in his scrotum since his kidney transplant in 2010.  Pharmacy has been consulted for Vancomycin dosing for cellulitis, possible infected calcified mass. Note: hemodoalysis patient with SCr 10.85 yesterday; most recent dialysis session was Saturday 4/1 with next session scheduled for Tuesday, 4/4.  Plan: Vancomycin 1g IV qHD (TTS) Measure Vanc trough at steady state. Follow up HD plans, culture results, and clinical course.   Height: 5\' 9"  (175.3 cm) Weight: 182 lb 8.7 oz (82.8 kg) IBW/kg (Calculated) : 70.7  Temp (24hrs), Avg:98.5 F (36.9 C), Min:98.2 F (36.8 C), Max:99.1 F (37.3 C)   Recent Labs Lab 02/22/16 1509 02/22/16 1653 02/22/16 2000 02/23/16 0529  WBC 20.6*  --   --  13.8*  CREATININE 10.85*  --   --   --   LATICACIDVEN  --  1.16 0.88  --     Estimated Creatinine Clearance: 7.8 mL/min (by C-G formula based on Cr of 10.85).    No Known Allergies  Antimicrobials this admission: 4/3 Vancomycin >>   Dose adjustments this admission:  Microbiology results: 4/3 BCx: sent  Samuel Rowland, PharmD, BCPS Clinical Pharmacist Pager [phone]  02/23/2016 11:03 AM

## 2016-02-24 DIAGNOSIS — Z7901 Long term (current) use of anticoagulants: Secondary | ICD-10-CM

## 2016-02-24 DIAGNOSIS — N492 Inflammatory disorders of scrotum: Principal | ICD-10-CM

## 2016-02-24 DIAGNOSIS — N186 End stage renal disease: Secondary | ICD-10-CM

## 2016-02-24 LAB — CBC WITH DIFFERENTIAL/PLATELET
BASOS PCT: 0 %
Basophils Absolute: 0 10*3/uL (ref 0.0–0.1)
Eosinophils Absolute: 0.1 10*3/uL (ref 0.0–0.7)
Eosinophils Relative: 1 %
HEMATOCRIT: 35.4 % — AB (ref 39.0–52.0)
HEMOGLOBIN: 11.1 g/dL — AB (ref 13.0–17.0)
LYMPHS PCT: 8 %
Lymphs Abs: 1.1 10*3/uL (ref 0.7–4.0)
MCH: 26 pg (ref 26.0–34.0)
MCHC: 31.4 g/dL (ref 30.0–36.0)
MCV: 82.9 fL (ref 78.0–100.0)
MONOS PCT: 8 %
Monocytes Absolute: 1.1 10*3/uL — ABNORMAL HIGH (ref 0.1–1.0)
NEUTROS ABS: 12 10*3/uL — AB (ref 1.7–7.7)
Neutrophils Relative %: 83 %
Platelets: 189 10*3/uL (ref 150–400)
RBC: 4.27 MIL/uL (ref 4.22–5.81)
RDW: 21.4 % — ABNORMAL HIGH (ref 11.5–15.5)
WBC: 14.3 10*3/uL — ABNORMAL HIGH (ref 4.0–10.5)

## 2016-02-24 LAB — PROTIME-INR
INR: 2.5 — AB (ref 0.00–1.49)
Prothrombin Time: 26.7 seconds — ABNORMAL HIGH (ref 11.6–15.2)

## 2016-02-24 LAB — MAGNESIUM: Magnesium: 2.1 mg/dL (ref 1.7–2.4)

## 2016-02-24 LAB — BASIC METABOLIC PANEL
Anion gap: 18 — ABNORMAL HIGH (ref 5–15)
BUN: 29 mg/dL — AB (ref 6–20)
CHLORIDE: 93 mmol/L — AB (ref 101–111)
CO2: 27 mmol/L (ref 22–32)
Calcium: 9.5 mg/dL (ref 8.9–10.3)
Creatinine, Ser: 8.25 mg/dL — ABNORMAL HIGH (ref 0.61–1.24)
GFR calc Af Amer: 8 mL/min — ABNORMAL LOW (ref >60)
GFR, EST NON AFRICAN AMERICAN: 7 mL/min — AB (ref >60)
GLUCOSE: 92 mg/dL (ref 65–99)
POTASSIUM: 4.1 mmol/L (ref 3.5–5.1)
Sodium: 138 mmol/L (ref 135–145)

## 2016-02-24 MED ORDER — DIPHENHYDRAMINE HCL 25 MG PO CAPS
50.0000 mg | ORAL_CAPSULE | Freq: Once | ORAL | Status: AC
  Administered 2016-02-24: 50 mg via ORAL
  Filled 2016-02-24: qty 2

## 2016-02-24 MED ORDER — DIPHENHYDRAMINE HCL 25 MG PO CAPS
25.0000 mg | ORAL_CAPSULE | ORAL | Status: DC | PRN
  Administered 2016-02-24 – 2016-02-25 (×3): 25 mg via ORAL
  Filled 2016-02-24 (×2): qty 1

## 2016-02-24 MED ORDER — FENTANYL CITRATE (PF) 100 MCG/2ML IJ SOLN
12.5000 ug | INTRAMUSCULAR | Status: DC
  Administered 2016-02-24 – 2016-02-26 (×8): 12.5 ug via INTRAVENOUS
  Filled 2016-02-24 (×9): qty 2

## 2016-02-24 NOTE — Progress Notes (Addendum)
[facility] PROGRESS NOTE  Samuel Rowland OZH:[id number] DOB: 08-06-1961 DOA: 02/22/2016 PCP: Pcp Not In System  Brief narrative 55 year old with history of ESRD on dialysis (TTS), paroxysmal A. fib on Coumadin, hypertension presented to the ED with increasing pain with erythema and swelling of his left scrotum. Patient has known calcium deposits in his scrotum for the past 1 year (reportedly had scrotal deposits since his renal transplant in 2010) ,without any drainage or discharge. Patient reports almost 40 pound weight loss in the past 15 months. He presented to the urgent care and was then directed to the ED. Patient reports being sexually active and monogamous. Patient vitals were stable in the ED. White count was markedly elevated to 20.6 K, hyperkalemia with creatinine of 10.85. Ultrasound of the scrotum showed bilateral microlithiasis, right-sided 4 mm epididymal cyst and heterogeneous left epididymis without epididymitis. Blood cultures sent and patient given IV vancomycin and admitted to hospitalist service.  Assessment/Plan: Scrotal cellulitis cx negative. Continue empiric abx. Reports 40lbs wt loss in 14 months. Totally improving. Afebrile and WBC improving. Discussed clinical and ultrasound findings with urology Dr. Laverle Patter. Recommends that microlithiasis does not have any clinical correlation with testicular cancer unless patient is at significant risk. Given clinical improvement recommends to continue with antibiotics and consult as needed. 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 Continue with scheduled dialysis. Reportedly has surgery appointment in [facility] for parathyroidectomy. Discontinued vitamin D.  Paroxysmal A. fib Rate controlled. Continue amiodarone and metoprolol. Continue warfarin.  History of failed renal transplant Continue prednisone  Hypertension Stable. Continue metoprolol    DVT prophylaxis: Subcutaneous  heparin  Diet: Renal  Code Status: Full code Family Communication: None at bedside Disposition Plan: Pending clinical improvement. Possibly in the next 48 hours.   Consultants:  Renal  Spoke with Dr. Laverle Patter (urology) on the phone  Procedures:  Ultrasound scrotum  Antibiotics:  IV vancomycin 4/3--  HPI/Subjective: Feels pain and swelling to be 20% better. No fever.   Objective: Filed Vitals:   02/23/16 1847 02/23/16 2054  BP: 145/76 149/71  Pulse: 66 72  Temp: 97.9 F (36.6 C) 98.3 F (36.8 C)  Resp: 19 18    Intake/Output Summary (Last 24 hours) at 02/24/16 0910 Last data filed at 02/24/16 0351  Gross per 24 hour  Intake    360 ml  Output   4000 ml  Net  -3640 ml   Filed Weights   02/22/16 2223 02/23/16 1429 02/23/16 1847  Weight: 82.8 kg (182 lb 8.7 oz) 82.6 kg (182 lb 1.6 oz) 78.2 kg (172 lb 6.4 oz)    Exam:   General:  NAD  HEENT: no pallor, moist mucosa  Chest: clear b/l  Cardiovascular: NS1&S2, no murmurs  GI: soft, NT, N,D BS+, scrotal swelling with nodular "microliths" as mentioned on [place], tender to palpation  Musculoskeletal: warm, no edema, left AV graft   GEX:BMWUX and oreinted  Data Reviewed: Basic Metabolic Panel:  Recent Labs Lab 02/22/16 1509 02/23/16 1458 02/24/16 0256  NA 135 134* 138  K 5.2* 5.9* 4.1  CL 99* 95* 93*  CO2 20* 19* 27  GLUCOSE 82 112* 92  BUN 49* 64* 29*  CREATININE 10.85* 13.45* 8.25*  CALCIUM 9.1 9.4 9.5  MG  --   --  2.1  PHOS  --  11.4*  --    Liver Function Tests:  Recent Labs Lab 02/23/16 1458  ALBUMIN 2.9*   No results for input(s): LIPASE, AMYLASE in the last 168  hours. No results for input(s): AMMONIA in the last 168 hours. CBC:  Recent Labs Lab 02/22/16 1509 02/23/16 0529 02/24/16 0256  WBC 20.6* 13.8* 14.3*  NEUTROABS 17.2*  --  12.0*  HGB 11.3* 10.8* 11.1*  HCT 35.5* 35.0* 35.4*  MCV 80.5 81.4 82.9  PLT 216 180 189   Cardiac Enzymes: No results for input(s): CKTOTAL,  CKMB, CKMBINDEX, TROPONINI in the last 168 hours. BNP (last 3 results) No results for input(s): BNP in the last 8760 hours.  ProBNP (last 3 results) No results for input(s): PROBNP in the last 8760 hours.  CBG: No results for input(s): GLUCAP in the last 168 hours.  Recent Results (from the past 240 hour(s))  Culture, blood (routine x 2)     Status: None (Preliminary result)   Collection Time: 02/22/16  4:45 PM  Result Value Ref Range Status   Specimen Description BLOOD RIGHT ANTECUBITAL  Final   Special Requests BOTTLES DRAWN AEROBIC AND ANAEROBIC 5CC  Final   Culture   Final    NO GROWTH < 24 HOURS [facility]    Report Status PENDING  Incomplete  Culture, blood (routine x 2)     Status: None (Preliminary result)   Collection Time: 02/22/16  4:54 PM  Result Value Ref Range Status   Specimen Description BLOOD RIGHT HAND  Final   Special Requests BOTTLES DRAWN AEROBIC AND ANAEROBIC 5CC  Final   Culture   Final    NO GROWTH < 24 HOURS [facility]    Report Status PENDING  Incomplete  MRSA PCR Screening     Status: Abnormal   Collection Time: 02/23/16  1:30 AM  Result Value Ref Range Status   MRSA by PCR POSITIVE (A) NEGATIVE Final    Comment:        The GeneXpert MRSA Assay (FDA approved for NASAL specimens only), is one component of a comprehensive MRSA colonization surveillance program. It is not intended to diagnose MRSA infection nor to guide or monitor treatment for MRSA infections. RESULT CALLED TO, READ BACK BY AND VERIFIED WITH: K BUBON,RN AT [id number] 02/23/16 BY L BENFIELD RESULT CALLED TO, READ BACK BY AND VERIFIED WITH: A. STOPHILL RN 6:56 02/23/16 Samuel Rowland([email or web address])      Studies: Koreas Scrotum  02/22/2016  CLINICAL DATA:  55 year old male with left testicular pain and swelling for 3 days. Initial encounter. EXAM: SCROTAL ULTRASOUND DOPPLER ULTRASOUND OF THE TESTICLES TECHNIQUE: Complete ultrasound examination of the testicles,  epididymis, and other scrotal structures was performed. Color and spectral Doppler ultrasound were also utilized to evaluate blood flow to the testicles. COMPARISON:  None. FINDINGS: Measurements: 4.5 x 1.8 x 3.2 cm. Microlithiasis without mass detected. Left testicle Measurements: 3.5 x 1.9 x 2.6 cm. Microlithiasis without mass detected. Right epididymis:  4 mm epididymal head cyst. Left epididymis: Slightly heterogeneous without discrete mass appear. Hydrocele:  None visualized. Varicocele:  None visualized. Skin thickening.  Etiology indeterminate.  Cellulitis not excluded. Pulsed Doppler interrogation of both testes demonstrates normal low resistance arterial and venous waveforms bilaterally. 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 Current literature suggests that testicular microlithiasis is not a significant independent risk factor for development of testicular carcinoma, and that follow up imaging is not warranted in the absence of other risk factors. Monthly testicular self-examination and annual physical exams are considered appropriate surveillance. If patient has other risk factors for testicular carcinoma, then referral to Urology should be considered. (Reference: Ellender HoseeCastro, et al.: A 5-Year  Follow up Study of Asymptomatic Men with Testicular Microlithiasis. J Urol 2008; 179:1420-1423.) 4 mm right epididymal cyst. Heterogeneous left epididymis of questionable significance. No discrete left epididymal mass. No significant increased flow to suggest epididymitis. Skin thickening. Etiology indeterminate. Cellulitis not excluded in the proper clinical setting. No evidence of testicular torsion. No asymmetric blood flow to suggest orchitis. Electronically Signed   By: Lacy Duverney M.D.   On: 02/22/2016 16:19   [place] Art/ven Flow Abd Pelv Doppler  02/22/2016  CLINICAL DATA:  55 year old male with left testicular pain and swelling for 3 days. Initial encounter. EXAM: SCROTAL  ULTRASOUND DOPPLER ULTRASOUND OF THE TESTICLES TECHNIQUE: Complete ultrasound examination of the testicles, epididymis, and other scrotal structures was performed. Color and spectral Doppler ultrasound were also utilized to evaluate blood flow to the testicles. COMPARISON:  None. FINDINGS: Measurements: 4.5 x 1.8 x 3.2 cm. Microlithiasis without mass detected. Left testicle Measurements: 3.5 x 1.9 x 2.6 cm. Microlithiasis without mass detected. Right epididymis:  4 mm epididymal head cyst. Left epididymis: Slightly heterogeneous without discrete mass appear. Hydrocele:  None visualized. Varicocele:  None visualized. Skin thickening.  Etiology indeterminate.  Cellulitis not excluded. Pulsed Doppler interrogation of both testes demonstrates normal low resistance arterial and venous waveforms bilaterally. 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 Current literature suggests that testicular microlithiasis is not a significant independent risk factor for development of testicular carcinoma, and that follow up imaging is not warranted in the absence of other risk factors. Monthly testicular self-examination and annual physical exams are considered appropriate surveillance. If patient has other risk factors for testicular carcinoma, then referral to Urology should be considered. (Reference: DeCastro, et al.: A 5-Year Follow up Study of Asymptomatic Men with Testicular Microlithiasis. J Urol 2008; 179:1420-1423.) 4 mm right epididymal cyst. Heterogeneous left epididymis of questionable significance. No discrete left epididymal mass. No significant increased flow to suggest epididymitis. Skin thickening. Etiology indeterminate. Cellulitis not excluded in the proper clinical setting. No evidence of testicular torsion. No asymmetric blood flow to suggest orchitis. Electronically Signed   By: Lacy Duverney M.D.   On: 02/22/2016 16:19    Scheduled Meds: . amiodarone  400 mg Oral Daily  .  Chlorhexidine Gluconate Cloth  6 each Topical Q0600  . cinacalcet  30 mg Oral Q breakfast  . cinacalcet  60 mg Oral Q supper  . [START ON 03/01/2016] darbepoetin (ARANESP) injection - DIALYSIS  40 mcg Intravenous Q Tue-HD  . diphenhydrAMINE  50 mg Oral Once  . fentaNYL (SUBLIMAZE) injection  12.5 mcg Intravenous Q4H  . metoprolol tartrate  25 mg Oral BID  . multivitamin  1 tablet Oral QHS  . mupirocin ointment  1 application Nasal BID  . predniSONE  10 mg Oral Daily  . sevelamer carbonate  1,600 mg Oral TID WC  . vancomycin  1,000 mg Intravenous Q T,Th,Sa-HD  . warfarin  2 mg Oral q1800  . Warfarin - Pharmacist Dosing Inpatient   Does not apply q1800   Continuous Infusions:     Time spent: 25 minutes    Krystyn Picking  [facility] Pager [phone]. If 7PM-7AM, please contact night-coverage at [email or web address], password [facility] 02/24/2016, 9:10 AM  LOS: 2 days

## 2016-02-24 NOTE — Progress Notes (Signed)
S: Says scrotal pain a little better O:BP 149/71 mmHg  Pulse 72  Temp(Src) 98.3 F (36.8 C) (Oral)  Resp 18  Ht  (1.753 m)  Wt 78.2 kg (172 lb 6.4 oz)  BMI 25.45 kg/m2  SpO2 100%  Intake/Output Summary (Last 24 hours) at 02/24/16 1033 Last data filed at 02/24/16 0351  Gross per 24 hour  Intake    360 ml  Output   4000 ml  Net  -3640 ml   Weight change: -0.408 kg (-14.4 oz) UJW:JXBJY and alert CVS:RRR Resp:clear Abd:+ BS NTND Ext: No edema NEURO:CNI M&SI Ox3   . amiodarone  400 mg Oral Daily  . Chlorhexidine Gluconate Cloth  6 each Topical Q0600  . cinacalcet  30 mg Oral Q breakfast  . cinacalcet  60 mg Oral Q supper  . [START ON 03/01/2016] darbepoetin (ARANESP) injection - DIALYSIS  40 mcg Intravenous Q Tue-HD  . diphenhydrAMINE  50 mg Oral Once  . fentaNYL (SUBLIMAZE) injection  12.5 mcg Intravenous Q4H  . metoprolol tartrate  25 mg Oral BID  . multivitamin  1 tablet Oral QHS  . mupirocin ointment  1 application Nasal BID  . predniSONE  10 mg Oral Daily  . sevelamer carbonate  1,600 mg Oral TID WC  . vancomycin  1,000 mg Intravenous Q T,Th,Sa-HD  . warfarin  2 mg Oral q1800  . Warfarin - Pharmacist Dosing Inpatient   Does not apply q1800   Samuel Scrotum  02/22/2016  CLINICAL DATA:  55 year old male with left testicular pain and swelling for 3 days. Initial encounter. EXAM: SCROTAL ULTRASOUND DOPPLER ULTRASOUND OF THE TESTICLES TECHNIQUE: Complete ultrasound examination of the testicles, epididymis, and other scrotal structures was performed. Color and spectral Doppler ultrasound were also utilized to evaluate blood flow to the testicles. COMPARISON:  None. FINDINGS: Measurements: 4.5 x 1.8 x 3.2 cm. Microlithiasis without mass detected. Left testicle Measurements: 3.5 x 1.9 x 2.6 cm. Microlithiasis without mass detected. Right epididymis:  4 mm epididymal head cyst. Left epididymis: Slightly heterogeneous without discrete mass appear. Hydrocele:  None visualized.  Varicocele:  None visualized. Skin thickening.  Etiology indeterminate.  Cellulitis not excluded. Pulsed Doppler interrogation of both testes demonstrates normal low resistance arterial and venous waveforms bilaterally. 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 Current literature suggests that testicular microlithiasis is not a significant independent risk factor for development of testicular carcinoma, and that follow up imaging is not warranted in the absence of other risk factors. Monthly testicular self-examination and annual physical exams are considered appropriate surveillance. If patient has other risk factors for testicular carcinoma, then referral to Urology should be considered. (Reference: DeCastro, et al.: A 5-Year Follow up Study of Asymptomatic Men with Testicular Microlithiasis. J Urol 2008; 179:1420-1423.) 4 mm right epididymal cyst. Heterogeneous left epididymis of questionable significance. No discrete left epididymal mass. No significant increased flow to suggest epididymitis. Skin thickening. Etiology indeterminate. Cellulitis not excluded in the proper clinical setting. No evidence of testicular torsion. No asymmetric blood flow to suggest orchitis. Electronically Signed   By: Lacy Duverney M.D.   On: 02/22/2016 16:19   Samuel Art/ven Flow Abd Pelv Doppler  02/22/2016  CLINICAL DATA:  55 year old male with left testicular pain and swelling for 3 days. Initial encounter. EXAM: SCROTAL ULTRASOUND DOPPLER ULTRASOUND OF THE TESTICLES TECHNIQUE: Complete ultrasound examination of the testicles, epididymis, and other scrotal structures was performed. Color and spectral Doppler ultrasound were also utilized to evaluate blood flow to the testicles. COMPARISON:  None.  FINDINGS: Measurements: 4.5 x 1.8 x 3.2 cm. Microlithiasis without mass detected. Left testicle Measurements: 3.5 x 1.9 x 2.6 cm. Microlithiasis without mass detected. Right epididymis:  4 mm epididymal head  cyst. Left epididymis: Slightly heterogeneous without discrete mass appear. Hydrocele:  None visualized. Varicocele:  None visualized. Skin thickening.  Etiology indeterminate.  Cellulitis not excluded. Pulsed Doppler interrogation of both testes demonstrates normal low resistance arterial and venous waveforms bilaterally. 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 Current literature suggests that testicular microlithiasis is not a significant independent risk factor for development of testicular carcinoma, and that follow up imaging is not warranted in the absence of other risk factors. Monthly testicular self-examination and annual physical exams are considered appropriate surveillance. If patient has other risk factors for testicular carcinoma, then referral to Urology should be considered. (Reference: DeCastro, et al.: A 5-Year Follow up Study of Asymptomatic Men with Testicular Microlithiasis. J Urol 2008; 179:1420-1423.) 4 mm right epididymal cyst. Heterogeneous left epididymis of questionable significance. No discrete left epididymal mass. No significant increased flow to suggest epididymitis. Skin thickening. Etiology indeterminate. Cellulitis not excluded in the proper clinical setting. No evidence of testicular torsion. No asymmetric blood flow to suggest orchitis. Electronically Signed   By: Lacy DuverneySteven  Rowland M.D.   On: 02/22/2016 16:19   BMET    Component Value Date/Time   NA 138 02/24/2016 0256   K 4.1 02/24/2016 0256   CL 93* 02/24/2016 0256   CO2 27 02/24/2016 0256   GLUCOSE 92 02/24/2016 0256   BUN 29* 02/24/2016 0256   CREATININE 8.25* 02/24/2016 0256   CALCIUM 9.5 02/24/2016 0256   GFRNONAA 7* 02/24/2016 0256   GFRAA 8* 02/24/2016 0256   CBC    Component Value Date/Time   WBC 14.3* 02/24/2016 0256   RBC 4.27 02/24/2016 0256   HGB 11.1* 02/24/2016 0256   HCT 35.4* 02/24/2016 0256   PLT 189 02/24/2016 0256   MCV 82.9 02/24/2016 0256   MCH 26.0  02/24/2016 0256   MCHC 31.4 02/24/2016 0256   RDW 21.4* 02/24/2016 0256   LYMPHSABS 1.1 02/24/2016 0256   MONOABS 1.1* 02/24/2016 0256   EOSABS 0.1 02/24/2016 0256   BASOSABS 0.0 02/24/2016 0256     Assessment:  1. Scrotal cellulitis 2. ESRD 3. Sec HPTH   4. 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  Plan HD in AM 2. He will most likely need PTHectomy and [facility]   Jenniferann Stuckert T

## 2016-02-24 NOTE — Progress Notes (Signed)
ANTICOAGULATION CONSULT NOTE  Pharmacy Consult for Warfarin Indication: atrial fibrillation  No Known Allergies  Patient Measurements: Height: 5\' 9"  (175.3 cm) Weight: 172 lb 6.4 oz (78.2 kg) (standing scale) IBW/kg (Calculated) : 70.7  Vital Signs:    Labs:  Recent Labs  02/22/16 1509 02/22/16 2310 02/23/16 0529 02/23/16 1458 02/24/16 0256  HGB 11.3*  --  10.8*  --  11.1*  HCT 35.5*  --  35.0*  --  35.4*  PLT 216  --  180  --  189  LABPROT  --  25.6* 26.7*  --  26.7*  INR  --  2.37* 2.50*  --  2.50*  CREATININE 10.85*  --   --  13.45* 8.25*    Estimated Creatinine Clearance: 10.2 mL/min (by C-G formula based on Cr of 8.25).   Medical History: Past Medical History  Diagnosis Date  . Kidney transplant as cause of abnormal reaction or later complication   . Hypertension   . Nausea and vomiting     chronic  . Anemia   . Atrial fibrillation (HCC)   . Heart murmur   . Paroxysmal atrial fibrillation with RVR (HCC)   . GERD (gastroesophageal reflux disease)   . Renal disorder     S/P right transplant 2010  . ESRD (end stage renal disease) on dialysis [facility](HCC)     [facility] since ~10/2014" (11/12/2015)    Medications:  Prescriptions prior to admission  Medication Sig Dispense Refill Last Dose  . amiodarone (PACERONE) 400 MG tablet Take 1 tablet (400 mg total) by mouth daily. 30 tablet 11 02/21/2016 at Unknown time  . cinacalcet (SENSIPAR) 30 MG tablet Take 30 mg by mouth daily with supper.    02/21/2016 at Unknown time  . metoprolol tartrate (LOPRESSOR) 25 MG tablet Take 1 tablet (25 mg total) by mouth 2 (two) times daily. 60 tablet 0 02/22/2016 at 0800  . predniSONE (DELTASONE) 10 MG tablet Take 10 mg by mouth daily.   02/21/2016 at Unknown time  . sevelamer carbonate (RENVELA) 800 MG tablet Take 800-1,600 mg by mouth See admin instructions. Takes 1600mg  with each meal and 800mg  with each snack   02/21/2016 at Unknown time  . warfarin (COUMADIN) 2 MG tablet  Take 2 mg by mouth daily.   02/21/2016 at 2100    Assessment: 55 y.o. male admitted with cellulitis, h/o Afib, to continue warfarin. Pt's INR is stable. Will continue to follow.  Goal of Therapy:  INR 2-3 Monitor platelets by anticoagulation protocol: Yes   Plan:  Warfarin 2mg  daily Daily INR Monitor s/sx of bleeding  Arlean Hoppingorey M. Newman PiesBall, PharmD, BCPS Clinical Pharmacist Pager [phone] 02/24/2016,12:22 PM

## 2016-02-25 ENCOUNTER — Inpatient Hospital Stay (HOSPITAL_COMMUNITY): Payer: Medicare Other

## 2016-02-25 LAB — CBC
HCT: 35.9 % — ABNORMAL LOW (ref 39.0–52.0)
Hemoglobin: 11 g/dL — ABNORMAL LOW (ref 13.0–17.0)
MCH: 25.8 pg — AB (ref 26.0–34.0)
MCHC: 30.6 g/dL (ref 30.0–36.0)
MCV: 84.3 fL (ref 78.0–100.0)
PLATELETS: 244 10*3/uL (ref 150–400)
RBC: 4.26 MIL/uL (ref 4.22–5.81)
RDW: 21.3 % — AB (ref 11.5–15.5)
WBC: 10.6 10*3/uL — AB (ref 4.0–10.5)

## 2016-02-25 LAB — PROTIME-INR
INR: 2.46 — ABNORMAL HIGH (ref 0.00–1.49)
PROTHROMBIN TIME: 26.4 s — AB (ref 11.6–15.2)

## 2016-02-25 LAB — PARATHYROID HORMONE, INTACT (NO CA): PTH: 1464 pg/mL — AB (ref 15–65)

## 2016-02-25 MED ORDER — WARFARIN SODIUM 2 MG PO TABS
2.0000 mg | ORAL_TABLET | Freq: Every day | ORAL | Status: DC
  Filled 2016-02-25: qty 1

## 2016-02-25 MED ORDER — VANCOMYCIN HCL IN DEXTROSE 1-5 GM/200ML-% IV SOLN
INTRAVENOUS | Status: AC
  Administered 2016-02-25: 1000 mg via INTRAVENOUS
  Filled 2016-02-25: qty 200

## 2016-02-25 MED ORDER — DIPHENHYDRAMINE HCL 25 MG PO CAPS
ORAL_CAPSULE | ORAL | Status: AC
  Administered 2016-02-25: 25 mg via ORAL
  Filled 2016-02-25: qty 1

## 2016-02-25 NOTE — Progress Notes (Signed)
[facility] PROGRESS NOTE  Samuel Rowland ZOX:[id number] DOB: [birth year] DOA: 02/22/2016 PCP: Pcp Not In System  Brief narrative 55 year old with history of ESRD on dialysis (TTS), paroxysmal A. fib on Coumadin, hypertension presented to the ED with increasing pain with erythema and swelling of his left scrotum. Patient has known calcium deposits in his scrotum for the past 1 year (reportedly had scrotal deposits since his renal transplant in 2010) ,without any drainage or discharge. Patient reports almost 40 pound weight loss in the past 15 months. He presented to the urgent care and was then directed to the ED. Patient reports being sexually active and monogamous. Patient vitals were stable in the ED. White count was markedly elevated to 20.6 K, hyperkalemia with creatinine of 10.85. Ultrasound of the scrotum showed bilateral microlithiasis, right-sided 4 mm epididymal cyst and heterogeneous left epididymis without epididymitis. Blood cultures sent and patient given IV vancomycin and admitted to hospitalist service.  Assessment/Plan: Scrotal cellulitis cx negative. Continue empiric abx. Reports 40lbs wt loss in 14 months. Afebrile and WBC improving. Discussed clinical and ultrasound findings with urology Dr. Laverle Patter. Recommends that microlithiasis does not have any clinical correlation with testicular cancer unless patient is at significant risk. Pt however has small pus discharge from the scrotum. Have consulted urology again. mcKenzie will see him. If cleared for discharge patient will go home with IV vancomycin with dialysis (until 03/06/2016). I will  let Dr. Briant Cedar know about his discharge that he can notify the dialysis center about antibiotics.    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 Continue with scheduled dialysis.  has surgery appointment in [facility] for parathyroidectomy on 02/26/2016 at 9 AM.. Discontinued vitamin D.  Paroxysmal A. fib Rate  controlled. Continue amiodarone and metoprolol. Continue warfarin.  History of failed renal transplant Continue prednisone  Hypertension Stable. Continue metoprolol    DVT prophylaxis: Subcutaneous heparin  Diet: Renal  Code Status: Full code Family Communication: None at bedside Disposition Plan: Pending evaluation by urology.   Consultants:  Renal  Urology (Dr. Thea Silversmith)  Procedures:  Ultrasound scrotum  Antibiotics:  IV vancomycin 4/3--  HPI/Subjective: Scrotal erythema and swelling much better but now has pus discharge from the scrotal area or the microlithiasis site and is tender.  Objective: Filed Vitals:   02/25/16 1100 02/25/16 1130  BP: 116/74 103/63  Pulse: 82 63  Temp:  97.2 F (36.2 C)  Resp: 16 19    Intake/Output Summary (Last 24 hours) at 02/25/16 1544 Last data filed at 02/25/16 1130  Gross per 24 hour  Intake      0 ml  Output   1023 ml  Net  -1023 ml   Filed Weights   02/23/16 1847 02/25/16 0726 02/25/16 1130  Weight: 78.2 kg (172 lb 6.4 oz) 79.2 kg (174 lb 9.7 oz) 78.2 kg (172 lb 6.4 oz)    Exam:   General:  NAD  HEENT:moist mucosa  Chest: clear b/l  Cardiovascular: NS1&S2, no murmurs  GI: soft, NT, N,D BS+, scrotal swelling and erythema improved with nodular "microliths" as mentioned on [place], tender to palpation With small purulent discharge  Musculoskeletal: warm, no edema, left AV graft     Data Reviewed: Basic Metabolic Panel:  Recent Labs Lab 02/22/16 1509 02/23/16 1458 02/24/16 0256  NA 135 134* 138  K 5.2* 5.9* 4.1  CL 99* 95* 93*  CO2 20* 19* 27  GLUCOSE 82 112* 92  BUN 49* 64* 29*  CREATININE 10.85* 13.45* 8.25*  CALCIUM 9.1 9.4  9.5  MG  --   --  2.1  PHOS  --  11.4*  --    Liver Function Tests:  Recent Labs Lab 02/23/16 1458  ALBUMIN 2.9*   No results for input(s): LIPASE, AMYLASE in the last 168 hours. No results for input(s): AMMONIA in the last 168 hours. CBC:  Recent Labs Lab  02/22/16 1509 02/23/16 0529 02/24/16 0256 02/25/16 1416  WBC 20.6* 13.8* 14.3* 10.6*  NEUTROABS 17.2*  --  12.0*  --   HGB 11.3* 10.8* 11.1* 11.0*  HCT 35.5* 35.0* 35.4* 35.9*  MCV 80.5 81.4 82.9 84.3  PLT 216 180 189 244   Cardiac Enzymes: No results for input(s): CKTOTAL, CKMB, CKMBINDEX, TROPONINI in the last 168 hours. BNP (last 3 results) No results for input(s): BNP in the last 8760 hours.  ProBNP (last 3 results) No results for input(s): PROBNP in the last 8760 hours.  CBG: No results for input(s): GLUCAP in the last 168 hours.  Recent Results (from the past 240 hour(s))  Culture, blood (routine x 2)     Status: None (Preliminary result)   Collection Time: 02/22/16  4:45 PM  Result Value Ref Range Status   Specimen Description BLOOD RIGHT ANTECUBITAL  Final   Special Requests BOTTLES DRAWN AEROBIC AND ANAEROBIC 5CC  Final   Culture   Final    NO GROWTH 3 DAYS Performed at [facility]    Report Status PENDING  Incomplete  Culture, blood (routine x 2)     Status: None (Preliminary result)   Collection Time: 02/22/16  4:54 PM  Result Value Ref Range Status   Specimen Description BLOOD RIGHT HAND  Final   Special Requests BOTTLES DRAWN AEROBIC AND ANAEROBIC 5CC  Final   Culture   Final    NO GROWTH 3 DAYS Performed at [facility]     Report Status PENDING  Incomplete  MRSA PCR Screening     Status: Abnormal   Collection Time: 02/23/16  1:30 AM  Result Value Ref Range Status   MRSA by PCR POSITIVE (A) NEGATIVE Final    Comment:        The GeneXpert MRSA Assay (FDA approved for NASAL specimens only), is one component of a comprehensive MRSA colonization surveillance program. It is not intended to diagnose MRSA infection nor to guide or monitor treatment for MRSA infections. RESULT CALLED TO, READ BACK BY AND VERIFIED WITH: K BUBON,RN AT [id number] 02/23/16 BY L BENFIELD RESULT CALLED TO, READ BACK BY AND VERIFIED WITH: A. STOPHILL RN 6:56 02/23/16  Arabi Bing(mkelly)      Studies: No results found.  Scheduled Meds: . amiodarone  400 mg Oral Daily  . Chlorhexidine Gluconate Cloth  6 each Topical Q0600  . cinacalcet  30 mg Oral Q breakfast  . cinacalcet  60 mg Oral Q supper  . [START ON 03/01/2016] darbepoetin (ARANESP) injection - DIALYSIS  40 mcg Intravenous Q Tue-HD  . fentaNYL (SUBLIMAZE) injection  12.5 mcg Intravenous Q4H  . metoprolol tartrate  25 mg Oral BID  . multivitamin  1 tablet Oral QHS  . mupirocin ointment  1 application Nasal BID  . predniSONE  10 mg Oral Daily  . sevelamer carbonate  1,600 mg Oral TID WC  . vancomycin  1,000 mg Intravenous Q T,Th,Sa-HD  . warfarin  2 mg Oral q1800  . Warfarin - Pharmacist Dosing Inpatient   Does not apply q1800   Continuous Infusions:     Time spent: 25 minutes  Eddie North  [facility] Pager [phone]. If 7PM-7AM, please contact night-coverage at [email or web address], password [facility] 02/25/2016, 3:44 PM  LOS: 3 days

## 2016-02-25 NOTE — Discharge Instructions (Addendum)

## 2016-02-25 NOTE — Progress Notes (Signed)
ANTICOAGULATION CONSULT NOTE  Pharmacy Consult for Warfarin Indication: atrial fibrillation  No Known Allergies  Patient Measurements: Height: 5\' 9"  (175.3 cm) Weight: 172 lb 6.4 oz (78.2 kg) IBW/kg (Calculated) : 70.7  Vital Signs: Temp: 97.2 F (36.2 C) (04/06 1130) Temp Source: Oral (04/06 1130) BP: 103/63 mmHg (04/06 1130) Pulse Rate: 63 (04/06 1130)  Labs:  Recent Labs  02/22/16 1509 02/22/16 2310 02/23/16 0529 02/23/16 1458 02/24/16 0256  HGB 11.3*  --  10.8*  --  11.1*  HCT 35.5*  --  35.0*  --  35.4*  PLT 216  --  180  --  189  LABPROT  --  25.6* 26.7*  --  26.7*  INR  --  2.37* 2.50*  --  2.50*  CREATININE 10.85*  --   --  13.45* 8.25*    Estimated Creatinine Clearance: 10.2 mL/min (by C-G formula based on Cr of 8.25).   Medical History: Past Medical History  Diagnosis Date  . Kidney transplant as cause of abnormal reaction or later complication   . Hypertension   . Nausea and vomiting     chronic  . Anemia   . Atrial fibrillation (HCC)   . Heart murmur   . Paroxysmal atrial fibrillation with RVR (HCC)   . GERD (gastroesophageal reflux disease)   . Renal disorder     S/P right transplant 2010  . ESRD (end stage renal disease) on dialysis [facility](HCC)     [facility] since ~10/2014" (11/12/2015)    Medications:  Prescriptions prior to admission  Medication Sig Dispense Refill Last Dose  . amiodarone (PACERONE) 400 MG tablet Take 1 tablet (400 mg total) by mouth daily. 30 tablet 11 02/21/2016 at Unknown time  . cinacalcet (SENSIPAR) 30 MG tablet Take 30 mg by mouth daily with supper.    02/21/2016 at Unknown time  . metoprolol tartrate (LOPRESSOR) 25 MG tablet Take 1 tablet (25 mg total) by mouth 2 (two) times daily. 60 tablet 0 02/22/2016 at 0800  . predniSONE (DELTASONE) 10 MG tablet Take 10 mg by mouth daily.   02/21/2016 at Unknown time  . sevelamer carbonate (RENVELA) 800 MG tablet Take 800-1,600 mg by mouth See admin instructions. Takes  1600mg  with each meal and 800mg  with each snack   02/21/2016 at Unknown time  . warfarin (COUMADIN) 2 MG tablet Take 2 mg by mouth daily.   02/21/2016 at 2100    Assessment: 55 y.o. male admitted with cellulitis, h/o Afib, to continue warfarin from PTA. Pt's INR 2.5 from yesterday, no INR today. Will continue to follow.  PTA warfarin: 2 mg po daily  Goal of Therapy:  INR 2-3 Monitor platelets by anticoagulation protocol: Yes   Plan:  Warfarin 2 mg po x1 Daily INR Monitor s/sx of bleeding    [name], PharmD, BCPS Clinical Pharmacist 02/25/2016 1:42 PM

## 2016-02-25 NOTE — Progress Notes (Signed)
Report given to hemodialysis RN.  

## 2016-02-25 NOTE — Consult Note (Signed)
Urology Consult  Referring physician: Dr. Clementeen Graham Reason for referral: Scrotal abscess  Chief Complaint: scrotal pain   History of Present Illness: Samuel Rowland is a 55yo with a hx of ESRD who was admitted 4 days ago with scortal cellulitis. He has a hx of sebaceous cysts and scrotal wall calcifications. No hx of abscess. He was placed on broad spectrum antibiotics and the scrotal cellulitis has improved but the patient developed a draining sinus. He has dull, constant, moderate, nonradiating scrotal pain which is worse with movement. No fevers/chills/sweats. No LUTS.  Past Medical History  Diagnosis Date  . Kidney transplant as cause of abnormal reaction or later complication   . Hypertension   . Nausea and vomiting     chronic  . Anemia   . Atrial fibrillation ([place])   . Heart murmur   . Paroxysmal atrial fibrillation with RVR ([place])   . GERD (gastroesophageal reflux disease)   . Renal disorder     S/P right transplant 2010  . ESRD (end stage renal disease) on dialysis [facility])     "[facility]; TTS since ~10/2014" (11/12/2015)   Past Surgical History  Procedure Laterality Date  . Kidney transplant Right 09/2009    "[facility]"  . Anterior cervical decomp/discectomy fusion  01/1999    C5-6   . Cardioversion    . Back surgery    . Colonoscopy w/ polypectomy  10/2009  . Esophagogastroduodenoscopy  10/2009  . Renal biopsy  2015  . Cardiac catheterization N/A 12/23/2015    Procedure: Left Heart Cath and Coronary Angiography;  Surgeon: Peter M Martinique, MD;  Location: [facility];  Service: Cardiovascular;  Laterality: N/A;    Medications: I have reviewed the patient's current medications. Allergies: No Known Allergies  Family History  Problem Relation Age of Onset  . Hypertension Father    Social History:  reports that he has quit smoking. His smoking use included Cigars. He has never used smokeless tobacco. He reports that he does not drink alcohol or use illicit  drugs.  Review of Systems  Gastrointestinal: Positive for nausea.  Skin: Positive for itching and rash.  All other systems reviewed and are negative.   Physical Exam:  Vital signs in last 24 hours: Temp:  [97.2 F (36.2 C)-98.2 F (36.8 C)] 97.2 F (36.2 C) (04/06 1130) Pulse Rate:  [63-103] 66 (04/06 1500) Resp:  [14-21] 17 (04/06 1500) BP: (96-120)/(60-81) 108/74 mmHg (04/06 1500) SpO2:  [95 %-100 %] 100 % (04/06 1500) Weight:  [78.2 kg (172 lb 6.4 oz)-79.2 kg (174 lb 9.7 oz)] 78.2 kg (172 lb 6.4 oz) (04/06 1130) Physical Exam  Constitutional: He is oriented to person, place, and time. He appears well-developed and well-nourished.  HENT:  Head: Normocephalic and atraumatic.  Eyes: EOM are normal. Pupils are equal, round, and reactive to light.  Neck: Normal range of motion. No thyromegaly present.  Cardiovascular: Normal rate and regular rhythm.   Respiratory: Effort normal. No respiratory distress.  GI: Soft. He exhibits no distension and no mass. There is no tenderness. There is no rebound and no guarding. Hernia confirmed negative in the right inguinal area and confirmed negative in the left inguinal area.  Genitourinary: Testes normal and penis normal. Circumcised. No penile tenderness. No discharge found.  2cm midline lower scrotal wall abscess. Multiple 1cm sebaceous cysts surrounding abscess.   Musculoskeletal: Normal range of motion.  Lymphadenopathy:       Right: No inguinal adenopathy present.  Left: No inguinal adenopathy present.  Neurological: He is alert and oriented to person, place, and time.  Skin: Skin is warm and dry.  Psychiatric: He has a normal mood and affect. His behavior is normal. Judgment and thought content normal.    Laboratory Data:  Results for orders placed or performed during the hospital encounter of 02/22/16 (from the past 72 hour(s))  Protime-INR     Status: Abnormal   Collection Time: 02/22/16 11:10 PM  Result Value Ref Range    Prothrombin Time 25.6 (H) 11.6 - 15.2 seconds   INR 2.37 (H) 0.00 - 1.49  MRSA PCR Screening     Status: Abnormal   Collection Time: 02/23/16  1:30 AM  Result Value Ref Range   MRSA by PCR POSITIVE (A) NEGATIVE    Comment:        The GeneXpert MRSA Assay (FDA approved for NASAL specimens only), is one component of a comprehensive MRSA colonization surveillance program. It is not intended to diagnose MRSA infection nor to guide or monitor treatment for MRSA infections. RESULT CALLED TO, READ BACK BY AND VERIFIED WITH: K BUBON,RN AT 4888 02/23/16 BY L BENFIELD RESULT CALLED TO, READ BACK BY AND VERIFIED WITH: A. STOPHILL RN 6:56 02/23/16 (mkelly)   CBC     Status: Abnormal   Collection Time: 02/23/16  5:29 AM  Result Value Ref Range   WBC 13.8 (H) 4.0 - 10.5 K/uL   RBC 4.30 4.22 - 5.81 MIL/uL   Hemoglobin 10.8 (L) 13.0 - 17.0 g/dL   HCT 35.0 (L) 39.0 - 52.0 %   MCV 81.4 78.0 - 100.0 fL   MCH 25.1 (L) 26.0 - 34.0 pg   MCHC 30.9 30.0 - 36.0 g/dL   RDW 21.5 (H) 11.5 - 15.5 %   Platelets 180 150 - 400 K/uL  Protime-INR     Status: Abnormal   Collection Time: 02/23/16  5:29 AM  Result Value Ref Range   Prothrombin Time 26.7 (H) 11.6 - 15.2 seconds   INR 2.50 (H) 0.00 - 1.49  Renal function panel     Status: Abnormal   Collection Time: 02/23/16  2:58 PM  Result Value Ref Range   Sodium 134 (L) 135 - 145 mmol/L   Potassium 5.9 (H) 3.5 - 5.1 mmol/L   Chloride 95 (L) 101 - 111 mmol/L   CO2 19 (L) 22 - 32 mmol/L   Glucose, Bld 112 (H) 65 - 99 mg/dL   BUN 64 (H) 6 - 20 mg/dL   Creatinine, Ser 13.45 (H) 0.61 - 1.24 mg/dL   Calcium 9.4 8.9 - 10.3 mg/dL   Phosphorus 11.4 (H) 2.5 - 4.6 mg/dL   Albumin 2.9 (L) 3.5 - 5.0 g/dL   GFR calc non Af Amer 4 (L) >60 mL/min   GFR calc Af Amer 4 (L) >60 mL/min    Comment: (NOTE) The eGFR has been calculated using the CKD EPI equation. This calculation has not been validated in all clinical situations. eGFR's persistently <60 mL/min signify  possible Chronic Kidney Disease.    Anion gap 20 (H) 5 - 15  Hepatitis B surface antigen     Status: None   Collection Time: 02/23/16  5:34 PM  Result Value Ref Range   Hepatitis B Surface Ag Negative Negative    Comment: (NOTE) Stat results given to [facility] 02/23/2016 at 21:45 Performed At: [facility] [place], [place] [id number] Lindon Romp MD [id number]   Parathyroid hormone, intact (  no Ca)     Status: Abnormal   Collection Time: 02/23/16  8:20 PM  Result Value Ref Range   PTH 1464 (H) 15 - 65 pg/mL    Comment: (NOTE) Performed At: [facility] [place], [place] [id number] Lindon Romp MD [id number]   Protime-INR     Status: Abnormal   Collection Time: 02/24/16  2:56 AM  Result Value Ref Range   Prothrombin Time 26.7 (H) 11.6 - 15.2 seconds   INR 2.50 (H) 0.00 - 5.17  Basic metabolic panel     Status: Abnormal   Collection Time: 02/24/16  2:56 AM  Result Value Ref Range   Sodium 138 135 - 145 mmol/L   Potassium 4.1 3.5 - 5.1 mmol/L    Comment: DELTA CHECK NOTED   Chloride 93 (L) 101 - 111 mmol/L   CO2 27 22 - 32 mmol/L   Glucose, Bld 92 65 - 99 mg/dL   BUN 29 (H) 6 - 20 mg/dL   Creatinine, Ser 8.25 (H) 0.61 - 1.24 mg/dL    Comment: DELTA CHECK NOTED   Calcium 9.5 8.9 - 10.3 mg/dL   GFR calc non Af Amer 7 (L) >60 mL/min   GFR calc Af Amer 8 (L) >60 mL/min    Comment: (NOTE) The eGFR has been calculated using the CKD EPI equation. This calculation has not been validated in all clinical situations. eGFR's persistently <60 mL/min signify possible Chronic Kidney Disease.    Anion gap 18 (H) 5 - 15  Magnesium     Status: None   Collection Time: 02/24/16  2:56 AM  Result Value Ref Range   Magnesium 2.1 1.7 - 2.4 mg/dL  CBC with Differential/Platelet     Status: Abnormal   Collection Time: 02/24/16  2:56 AM  Result Value Ref Range   WBC 14.3 (H) 4.0 - 10.5 K/uL   RBC 4.27 4.22 - 5.81 MIL/uL    Hemoglobin 11.1 (L) 13.0 - 17.0 g/dL   HCT 35.4 (L) 39.0 - 52.0 %   MCV 82.9 78.0 - 100.0 fL   MCH 26.0 26.0 - 34.0 pg   MCHC 31.4 30.0 - 36.0 g/dL   RDW 21.4 (H) 11.5 - 15.5 %   Platelets 189 150 - 400 K/uL   Neutrophils Relative % 83 %   Lymphocytes Relative 8 %   Monocytes Relative 8 %   Eosinophils Relative 1 %   Basophils Relative 0 %   Neutro Abs 12.0 (H) 1.7 - 7.7 K/uL   Lymphs Abs 1.1 0.7 - 4.0 K/uL   Monocytes Absolute 1.1 (H) 0.1 - 1.0 K/uL   Eosinophils Absolute 0.1 0.0 - 0.7 K/uL   Basophils Absolute 0.0 0.0 - 0.1 K/uL   RBC Morphology POLYCHROMASIA PRESENT    WBC Morphology VACUOLATED NEUTROPHILS    Smear Review LARGE PLATELETS PRESENT   Protime-INR     Status: Abnormal   Collection Time: 02/25/16  2:16 PM  Result Value Ref Range   Prothrombin Time 26.4 (H) 11.6 - 15.2 seconds   INR 2.46 (H) 0.00 - 1.49  CBC     Status: Abnormal   Collection Time: 02/25/16  2:16 PM  Result Value Ref Range   WBC 10.6 (H) 4.0 - 10.5 K/uL   RBC 4.26 4.22 - 5.81 MIL/uL   Hemoglobin 11.0 (L) 13.0 - 17.0 g/dL   HCT 35.9 (L) 39.0 - 52.0 %   MCV 84.3 78.0 - 100.0 fL   MCH 25.8 (L) 26.0 -  34.0 pg   MCHC 30.6 30.0 - 36.0 g/dL   RDW 21.3 (H) 11.5 - 15.5 %   Platelets 244 150 - 400 K/uL   Recent Results (from the past 240 hour(s))  Culture, blood (routine x 2)     Status: None (Preliminary result)   Collection Time: 02/22/16  4:45 PM  Result Value Ref Range Status   Specimen Description BLOOD RIGHT ANTECUBITAL  Final   Special Requests BOTTLES DRAWN AEROBIC AND ANAEROBIC 5CC  Final   Culture   Final    NO GROWTH 3 DAYS Performed at [facility]    Report Status PENDING  Incomplete  Culture, blood (routine x 2)     Status: None (Preliminary result)   Collection Time: 02/22/16  4:54 PM  Result Value Ref Range Status   Specimen Description BLOOD RIGHT HAND  Final   Special Requests BOTTLES DRAWN AEROBIC AND ANAEROBIC 5CC  Final   Culture   Final    NO GROWTH 3  DAYS Performed at [facility]    Report Status PENDING  Incomplete  MRSA PCR Screening     Status: Abnormal   Collection Time: 02/23/16  1:30 AM  Result Value Ref Range Status   MRSA by PCR POSITIVE (A) NEGATIVE Final    Comment:        The GeneXpert MRSA Assay (FDA approved for NASAL specimens only), is one component of a comprehensive MRSA colonization surveillance program. It is not intended to diagnose MRSA infection nor to guide or monitor treatment for MRSA infections. RESULT CALLED TO, READ BACK BY AND VERIFIED WITH: K BUBON,RN AT 0211 02/23/16 BY L BENFIELD RESULT CALLED TO, READ BACK BY AND VERIFIED WITH: A. STOPHILL RN 6:56 02/23/16 (mkelly)    Creatinine:  Recent Labs  02/22/16 1509 02/23/16 1458 02/24/16 0256  CREATININE 10.85* 13.45* 8.25*   Baseline Creatinine: NA  Impression/Assessment:  54yo with scrotal abscess and sebaceous cysts  Plan:  1. Abscess drained at the bedside and 20cc of purulent material drained. 2. Continue antibiotics 3. The patient will need 10 days of antibiotics for the infected cyst. We discussed surgical excision which will be scheduled as an outpatient following resolution of the infection.  Nicolette Bang 02/25/2016, 8:16 PM

## 2016-02-25 NOTE — Procedures (Signed)
Pt seen on HD.  Ap 190 VP 230  BFR 350.  Having some drainage from scrotal lesions.  Overall improved.  He could receive vanco at his outpt HD unit.  PTH 1464 on sensipar.

## 2016-02-26 DIAGNOSIS — L723 Sebaceous cyst: Secondary | ICD-10-CM | POA: Diagnosis present

## 2016-02-26 DIAGNOSIS — N492 Inflammatory disorders of scrotum: Secondary | ICD-10-CM | POA: Diagnosis present

## 2016-02-26 LAB — PROTIME-INR
INR: 2.56 — ABNORMAL HIGH (ref 0.00–1.49)
PROTHROMBIN TIME: 27.1 s — AB (ref 11.6–15.2)

## 2016-02-26 MED ORDER — HYDROCODONE-ACETAMINOPHEN 5-325 MG PO TABS: 1.0000 | ORAL_TABLET | Freq: Four times a day (QID) | ORAL | Status: DC | PRN

## 2016-02-26 MED ORDER — VANCOMYCIN HCL IN DEXTROSE 1-5 GM/200ML-% IV SOLN: 1000.0000 mg | INTRAVENOUS | Status: AC

## 2016-02-26 NOTE — Discharge Summary (Signed)
Physician Discharge Summary  Samuel Rowland ZOX:[id number] DOB: 07/09/1961 DOA: 02/22/2016  PCP: Lesle ChrisSteven daub, MD ( [facility]) Admit date: 02/22/2016 Discharge date: 02/26/2016  Time spent: 35 minutes  Recommendations for Outpatient Follow-up:  #1 Discharge home. Patient is complete 10 more days of IV vancomycin with dialysis (Tu, th , sat) until 03/07/2016. Follow-up with PCP in one week. #2 patient has rescheduled his appointment [facility] his [facility]athyroid removal for 02/29/2016. #3 follow-up with urologist Dr. Thea SilversmithMackenzie in 2 weeks   Discharge Diagnoses:  Principal problem Scrotal cellulitis with abscess  Active Problems:   Renal transplant recipient   Atrial fibrillation (HCC) [I48.91]   Long term (current) use of anticoagulants [Z79.01]   ESRD (end stage renal disease) (HCC)   Sebaceous cyst of scrotum   Discharge Condition: Fair  Diet recommendation: Renal  CODE STATUS: Full code    Filed Weights   02/23/16 1847 02/25/16 0726 02/25/16 1130  Weight: 78.2 kg (172 lb 6.4 oz) 79.2 kg (174 lb 9.7 oz) 78.2 kg (172 lb 6.4 oz)    History of present illness:  Please refer to admission H&P for details, in brief, 55 year old with history of ESRD on dialysis (TTS), paroxysmal A. fib on Coumadin, hypertension presented to the ED with increasing pain with erythema and swelling of his left scrotum. Patient has known calcium deposits in his scrotum for the past 1 year (reportedly had scrotal deposits since his renal transplant in 2010) ,without any drainage or discharge. Patient reports almost 40 pound weight loss in the past 15 months. He presented to the urgent care and was then directed to the ED. Patient reports being sexually active and monogamous. Patient vitals were stable in the ED. White count was markedly elevated to 20.6 K, hyperkalemia with creatinine of 10.85. Ultrasound of the scrotum showed bilateral microlithiasis, right-sided 4 mm epididymal cyst and  heterogeneous left epididymis without epididymitis. Blood cultures sent and patient given IV vancomycin and admitted to hospitalist service.  Hospital Course:  Scrotal cellulitis with abscess -Blood cultures negative. Given empiric IV vancomycin. Remained afebrile and WBC improved. Reports 40lbs wt loss in 14 months.  -On 4/6 patient had small pus discharge from the scrotum. Neurology consulted and was found to have scrotal abscess with a sebaceous cyst. The abscess was drained at bedside with 20 mL of purulent material. Urology recommends 10 more days of antibiotic for the infected cyst. He will follow-up with them in 2 weeks to discuss surgical excision after infection is treated. Scrotal ultrasound after I&D showed soft tissue scrotal thickening without any abscess. -Patient will be discharged on IV vancomycin to be received during dialysis to complete a total 2 weeks of antibiotic course. I have spoken with nephrologist Dr. Briant CedarMattingly who will notify his dialysis center regarding his antibiotics. Patient clinically stable for discharge home. I have prescribed him a short course of Vicodin for pain.    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 [facility] for  02/29/2016 at 9 AM.. Discontinued vitamin D.  Paroxysmal A. fib Rate controlled. Continue amiodarone and metoprolol. Continue warfarin. INR therapeutic.  History of failed renal transplant Continue prednisone  Hypertension Stable. Continue metoprolol      Family Communication: None at bedside Disposition Plan: Home with outpatient follow-up with urology in 2 weeks. Antibiotics needing scheduled dialysis   Consultants:  Renal  Urology (Dr. Thea SilversmithMackenzie)  Procedures:  Ultrasound scrotum  Antibiotics: IV vancomycin 4/3--  Procedures:  Scrotal  ultrasound  Consultations:  Nephrology  urology (Dr. Thea Silversmith)  Discharge  Exam: Filed Vitals:   02/25/16 1500 02/25/16 2330  BP: 108/74 117/85  Pulse: 66 76  Temp:  98.3 F (36.8 C)  Resp: 17 18     General: NAD  HEENT:moist mucosa  Chest: clear b/l  Cardiovascular: NS1&S2, no murmurs  GI: soft, NT, N,D BS+, scrotal swelling and erythema markedly improved with nodular "microliths" as mentioned on [place], tender to palpation With small purulent discharge  Musculoskeletal: warm, no edema, left AV graft   CNS: Alert and oriented  Discharge Instructions    Current Discharge Medication List    START taking these medications   Details  HYDROcodone-acetaminophen (NORCO/VICODIN) 5-325 MG tablet Take 1 tablet by mouth every 6 (six) hours as needed for moderate pain. Qty: 20 tablet, Refills: 0    vancomycin (VANCOCIN) 1 GM/200ML SOLN Inject 200 mLs (1,000 mg total) into the vein Every Tuesday,Thursday,and Saturday with dialysis. Qty: 4000 mL, Refills: 0      CONTINUE these medications which have NOT CHANGED   Details  amiodarone (PACERONE) 400 MG tablet Take 1 tablet (400 mg total) by mouth daily. Qty: 30 tablet, Refills: 11    cinacalcet (SENSIPAR) 30 MG tablet Take 30 mg by mouth daily with supper.     metoprolol tartrate (LOPRESSOR) 25 MG tablet Take 1 tablet (25 mg total) by mouth 2 (two) times daily. Qty: 60 tablet, Refills: 0    predniSONE (DELTASONE) 10 MG tablet Take 10 mg by mouth daily.    sevelamer carbonate (RENVELA) 800 MG tablet Take 800-1,600 mg by mouth See admin instructions. Takes 1600mg  with each meal and 800mg  with each snack    warfarin (COUMADIN) 2 MG tablet Take 2 mg by mouth daily.       No Known Allergies Follow-up Information    Follow up with Wilkie Aye, MD. Call in 2 weeks.   Specialty:  Urology   Contact information:   [address] [place] [address] [phone]        The results of significant diagnostics from this hospitalization (including imaging, microbiology, ancillary and laboratory)  are listed below for reference.    Significant Diagnostic Studies: US Scrotum  02/25/2016  CLINICAL DATA:  Scrotal cellulitis and status post incision and drainage of scrotal abscess. EXAM: ULTRASOUND OF SCROTUM TECHNIQUE: Complete ultrasound examination of the testicles, epididymis, and other scrotal structures was performed. COMPARISON:  None. FINDINGS: Right testicle Measurements: 4.1 x 2.2 x 2.5 cm. Stable microlithiasis. No mass or abscess. Left testicle Measurements: 3.7 x 2.5 x 2.7 cm. Stable microlithiasis. No mass or abscess. Right epididymis:  Normal in size and appearance. Left epididymis:  Normal in size and appearance. Hydrocele:  None visualized. Varicocele:  None visualized. Heterogeneous soft tissue thickening, particularly inferior to the testicles. Liquefied abscess is not visualized. IMPRESSION: Heterogeneous soft tissue scrotal thickening without evidence of a focal liquefied abscess by ultrasound after I and D. Electronically Signed   By: Irish Lack M.D.   On: 02/25/2016 21:17   US Scrotum  02/22/2016  CLINICAL DATA:  55 year old male with left testicular pain and swelling for 3 days. Initial encounter. EXAM: SCROTAL ULTRASOUND DOPPLER ULTRASOUND OF THE TESTICLES TECHNIQUE: Complete ultrasound examination of the testicles, epididymis, and other scrotal structures was performed. Color and spectral Doppler ultrasound were also utilized to evaluate blood flow to the testicles. COMPARISON:  None. FINDINGS: Measurements: 4.5 x 1.8 x 3.2 cm. Microlithiasis without mass detected. Left testicle Measurements: 3.5  x 1.9 x 2.6 cm. Microlithiasis without mass detected. Right epididymis:  4 mm epididymal head cyst. Left epididymis: Slightly heterogeneous without discrete mass appear. Hydrocele:  None visualized. Varicocele:  None visualized. Skin thickening.  Etiology indeterminate.  Cellulitis not excluded. Pulsed Doppler interrogation of both testes demonstrates normal low resistance arterial and  venous waveforms bilaterally. 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 Current literature suggests that testicular microlithiasis is not a significant independent risk factor for development of testicular carcinoma, and that follow up imaging is not warranted in the absence of other risk factors. Monthly testicular self-examination and annual physical exams are considered appropriate surveillance. If patient has other risk factors for testicular carcinoma, then referral to Urology should be considered. (Reference: DeCastro, et al.: A 5-Year Follow up Study of Asymptomatic Men with Testicular Microlithiasis. J Urol 2008; 179:1420-1423.) 4 mm right epididymal cyst. Heterogeneous left epididymis of questionable significance. No discrete left epididymal mass. No significant increased flow to suggest epididymitis. Skin thickening. Etiology indeterminate. Cellulitis not excluded in the proper clinical setting. No evidence of testicular torsion. No asymmetric blood flow to suggest orchitis. Electronically Signed   By: Lacy Duverney M.D.   On: 02/22/2016 16:19   [place] Art/ven Flow Abd Pelv Doppler  02/22/2016  CLINICAL DATA:  55 year old male with left testicular pain and swelling for 3 days. Initial encounter. EXAM: SCROTAL ULTRASOUND DOPPLER ULTRASOUND OF THE TESTICLES TECHNIQUE: Complete ultrasound examination of the testicles, epididymis, and other scrotal structures was performed. Color and spectral Doppler ultrasound were also utilized to evaluate blood flow to the testicles. COMPARISON:  None. FINDINGS: Measurements: 4.5 x 1.8 x 3.2 cm. Microlithiasis without mass detected. Left testicle Measurements: 3.5 x 1.9 x 2.6 cm. Microlithiasis without mass detected. Right epididymis:  4 mm epididymal head cyst. Left epididymis: Slightly heterogeneous without discrete mass appear. Hydrocele:  None visualized. Varicocele:  None visualized. Skin thickening.  Etiology indeterminate.  Cellulitis not  excluded. Pulsed Doppler interrogation of both testes demonstrates normal low resistance arterial and venous waveforms bilaterally. 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 Current literature suggests that testicular microlithiasis is not a significant independent risk factor for development of testicular carcinoma, and that follow up imaging is not warranted in the absence of other risk factors. Monthly testicular self-examination and annual physical exams are considered appropriate surveillance. If patient has other risk factors for testicular carcinoma, then referral to Urology should be considered. (Reference: DeCastro, et al.: A 5-Year Follow up Study of Asymptomatic Men with Testicular Microlithiasis. J Urol 2008; 179:1420-1423.) 4 mm right epididymal cyst. Heterogeneous left epididymis of questionable significance. No discrete left epididymal mass. No significant increased flow to suggest epididymitis. Skin thickening. Etiology indeterminate. Cellulitis not excluded in the proper clinical setting. No evidence of testicular torsion. No asymmetric blood flow to suggest orchitis. Electronically Signed   By: Lacy Duverney M.D.   On: 02/22/2016 16:19    Microbiology: Recent Results (from the past 240 hour(s))  Culture, blood (routine x 2)     Status: None (Preliminary result)   Collection Time: 02/22/16  4:45 PM  Result Value Ref Range Status   Specimen Description BLOOD RIGHT ANTECUBITAL  Final   Special Requests BOTTLES DRAWN AEROBIC AND ANAEROBIC 5CC  Final   Culture   Final    NO GROWTH 3 DAYS Performed at [facility]    Report Status PENDING  Incomplete  Culture, blood (routine x 2)     Status: None (Preliminary result)   Collection Time: 02/22/16  4:54  PM  Result Value Ref Range Status   Specimen Description BLOOD RIGHT HAND  Final   Special Requests BOTTLES DRAWN AEROBIC AND ANAEROBIC 5CC  Final   Culture   Final    NO GROWTH 3 DAYS Performed at  [facility]    Report Status PENDING  Incomplete  MRSA PCR Screening     Status: Abnormal   Collection Time: 02/23/16  1:30 AM  Result Value Ref Range Status   MRSA by PCR POSITIVE (A) NEGATIVE Final    Comment:        The GeneXpert MRSA Assay (FDA approved for NASAL specimens only), is one component of a comprehensive MRSA colonization surveillance program. It is not intended to diagnose MRSA infection nor to guide or monitor treatment for MRSA infections. RESULT CALLED TO, READ BACK BY AND VERIFIED WITH: K BUBON,RN AT 1610 02/23/16 BY L BENFIELD RESULT CALLED TO, READ BACK BY AND VERIFIED WITH: A. STOPHILL RN 6:56 02/23/16 [place] Bing)      Labs: Basic Metabolic Panel:  Recent Labs Lab 02/22/16 1509 02/23/16 1458 02/24/16 0256  NA 135 134* 138  K 5.2* 5.9* 4.1  CL 99* 95* 93*  CO2 20* 19* 27  GLUCOSE 82 112* 92  BUN 49* 64* 29*  CREATININE 10.85* 13.45* 8.25*  CALCIUM 9.1 9.4 9.5  MG  --   --  2.1  PHOS  --  11.4*  --    Liver Function Tests:  Recent Labs Lab 02/23/16 1458  ALBUMIN 2.9*   No results for input(s): LIPASE, AMYLASE in the last 168 hours. No results for input(s): AMMONIA in the last 168 hours. CBC:  Recent Labs Lab 02/22/16 1509 02/23/16 0529 02/24/16 0256 02/25/16 1416  WBC 20.6* 13.8* 14.3* 10.6*  NEUTROABS 17.2*  --  12.0*  --   HGB 11.3* 10.8* 11.1* 11.0*  HCT 35.5* 35.0* 35.4* 35.9*  MCV 80.5 81.4 82.9 84.3  PLT 216 180 189 244   Cardiac Enzymes: No results for input(s): CKTOTAL, CKMB, CKMBINDEX, TROPONINI in the last 168 hours. BNP: BNP (last 3 results) No results for input(s): BNP in the last 8760 hours.  ProBNP (last 3 results) No results for input(s): PROBNP in the last 8760 hours.  CBG: No results for input(s): GLUCAP in the last 168 hours.     Signed:  Eddie North MD.  [facility] 02/26/2016, 7:26 AM

## 2016-02-26 NOTE — Progress Notes (Signed)
ANTICOAGULATION / Antibiotic CONSULT NOTE  Pharmacy Consult for Warfarin / Vancomycin Indication: atrial fibrillation / scrotal abscess  No Known Allergies  Labs:  Recent Labs  02/23/16 1458 02/24/16 0256 02/25/16 1416 02/26/16 0327  HGB  --  11.1* 11.0*  --   HCT  --  35.4* 35.9*  --   PLT  --  189 244  --   LABPROT  --  26.7* 26.4* 27.1*  INR  --  2.50* 2.46* 2.56*  CREATININE 13.45* 8.25*  --   --     Estimated Creatinine Clearance: 10.2 mL/min (by C-G formula based on Cr of 8.25).   Assessment: 55 y.o. male admitted with cellulitis, h/o Afib, to continue warfarin from PTA. Pt's INR 2.5 from yesterday INR stable PTA warfarin: 2 mg po daily  Continues on IV vancomycin for scrotal abscess with 10 days of therapy planned (unitl 4/16) with HD, WBC stable, afebrile  Goal of Therapy:  INR 2-3 Monitor platelets by anticoagulation protocol: Yes   Plan:  Warfarin 2 mg po daily at 1800 pm Daily INR Monitor s/sx of bleeding  Continue Vancomycin 1 gram iv Q HD (TTS)  Thank you Okey RegalLisa Quita Mcgrory, PharmD [phone] 02/26/2016 8:51 AM

## 2016-02-26 NOTE — Progress Notes (Signed)
S: Had scrotal abscess drained O:BP 124/84 mmHg  Pulse 63  Temp(Src) 98 F (36.7 C) (Oral)  Resp 18  Ht 5\' 9"  (1.753 m)  Wt 78.2 kg (172 lb 6.4 oz)  BMI 25.45 kg/m2  SpO2 99%  Intake/Output Summary (Last 24 hours) at 02/26/16 1122 Last data filed at 02/25/16 1906  Gross per 24 hour  Intake    720 ml  Output   1023 ml  Net   -303 ml   Weight change:  NWG:NFAOZGen:awake and alert CVS:RRR Resp:clear Abd:+ BS NTND Ext: No edema NEURO:CNI M&SI Ox3 GU drainage from scrotum   . amiodarone  400 mg Oral Daily  . Chlorhexidine Gluconate Cloth  6 each Topical Q0600  . cinacalcet  30 mg Oral Q breakfast  . cinacalcet  60 mg Oral Q supper  . [START ON 03/01/2016] darbepoetin (ARANESP) injection - DIALYSIS  40 mcg Intravenous Q Tue-HD  . fentaNYL (SUBLIMAZE) injection  12.5 mcg Intravenous Q4H  . metoprolol tartrate  25 mg Oral BID  . multivitamin  1 tablet Oral QHS  . mupirocin ointment  1 application Nasal BID  . predniSONE  10 mg Oral Daily  . sevelamer carbonate  1,600 mg Oral TID WC  . vancomycin  1,000 mg Intravenous Q T,Th,Sa-HD  . warfarin  2 mg Oral q1800  . Warfarin - Pharmacist Dosing Inpatient   Does not apply q1800   [place] Scrotum  02/25/2016  CLINICAL DATA:  Scrotal cellulitis and status post incision and drainage of scrotal abscess. EXAM: ULTRASOUND OF SCROTUM TECHNIQUE: Complete ultrasound examination of the testicles, epididymis, and other scrotal structures was performed. COMPARISON:  None. FINDINGS: Right testicle Measurements: 4.1 x 2.2 x 2.5 cm. Stable microlithiasis. No mass or abscess. Left testicle Measurements: 3.7 x 2.5 x 2.7 cm. Stable microlithiasis. No mass or abscess. Right epididymis:  Normal in size and appearance. Left epididymis:  Normal in size and appearance. Hydrocele:  None visualized. Varicocele:  None visualized. Heterogeneous soft tissue thickening, particularly inferior to the testicles. Liquefied abscess is not visualized. IMPRESSION: Heterogeneous soft  tissue scrotal thickening without evidence of a focal liquefied abscess by ultrasound after I and D. Electronically Signed   By: Irish LackGlenn  Yamagata M.D.   On: 02/25/2016 21:17   BMET    Component Value Date/Time   NA 138 02/24/2016 0256   K 4.1 02/24/2016 0256   CL 93* 02/24/2016 0256   CO2 27 02/24/2016 0256   GLUCOSE 92 02/24/2016 0256   BUN 29* 02/24/2016 0256   CREATININE 8.25* 02/24/2016 0256   CALCIUM 9.5 02/24/2016 0256   GFRNONAA 7* 02/24/2016 0256   GFRAA 8* 02/24/2016 0256   CBC    Component Value Date/Time   WBC 10.6* 02/25/2016 1416   RBC 4.26 02/25/2016 1416   HGB 11.0* 02/25/2016 1416   HCT 35.9* 02/25/2016 1416   PLT 244 02/25/2016 1416   MCV 84.3 02/25/2016 1416   MCH 25.8* 02/25/2016 1416   MCHC 30.6 02/25/2016 1416   RDW 21.3* 02/25/2016 1416   LYMPHSABS 1.1 02/24/2016 0256   MONOABS 1.1* 02/24/2016 0256   EOSABS 0.1 02/24/2016 0256   BASOSABS 0.0 02/24/2016 0256     Assessment:  1. Scrotal cellulitis/abscess 2. ESRD 3. Sec HPTH   4. 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 Called his HD unit regarding Vanco dosing x 10 more days 2. [facility] of SHPTH  Estel Scholze T

## 2016-02-27 LAB — CULTURE, BLOOD (ROUTINE X 2)
Culture: NO GROWTH
Culture: NO GROWTH

## 2016-03-28 ENCOUNTER — Ambulatory Visit (INDEPENDENT_AMBULATORY_CARE_PROVIDER_SITE_OTHER): Payer: Medicare Other | Admitting: Emergency Medicine

## 2016-03-28 ENCOUNTER — Other Ambulatory Visit: Payer: Self-pay | Admitting: Pharmacist Clinician (PhC)/ Clinical Pharmacy Specialist

## 2016-03-28 VITALS — BP 140/80 | HR 60 | Temp 98.0°F | Resp 14 | Ht 70.25 in | Wt 178.0 lb

## 2016-03-28 DIAGNOSIS — N184 Chronic kidney disease, stage 4 (severe): Secondary | ICD-10-CM

## 2016-03-28 DIAGNOSIS — N509 Disorder of male genital organs, unspecified: Secondary | ICD-10-CM

## 2016-03-28 DIAGNOSIS — N492 Inflammatory disorders of scrotum: Secondary | ICD-10-CM | POA: Diagnosis not present

## 2016-03-28 DIAGNOSIS — N5089 Other specified disorders of the male genital organs: Secondary | ICD-10-CM

## 2016-03-28 MED ORDER — WARFARIN SODIUM 2 MG PO TABS: ORAL_TABLET | ORAL | Status: DC

## 2016-03-28 NOTE — Patient Instructions (Signed)
     IF you received an x-ray today, you will receive an invoice from [facility]. Please contact [facility] at [phone] with questions or concerns regarding your invoice.   IF you received labwork today, you will receive an invoice from [facility]. Please contact [facility] at [phone] with questions or concerns regarding your invoice.   Our billing staff will not be able to assist you with questions regarding bills from these companies.  You will be contacted with the lab results as soon as they are available. The fastest way to get your results is to activate your My Chart account. Instructions are located on the last page of this paperwork. If you have not heard from us regarding the results in 2 weeks, please contact this office.      

## 2016-03-28 NOTE — Progress Notes (Signed)
Subjective:  This chart was scribed for Arlyss Queen MD, by Tamsen Roers, at [facility].  This patient was seen in room 10 and the patient's care was started at 1:04 PM.   Chief Complaint  Patient presents with  . Follow-up    Scrotal mass     Patient ID: Samuel Rowland, male    DOB: 05-Feb-1961, 55 y.o.   MRN: [id number]  HPI HPI Comments: Samuel Rowland is a 55 y.o. male who presents to the [facility] for a follow up regarding his scrotal mass.  He states that the masses are now worse and he has more deposits than previous.  Patient was seen here on 02/22/16 regarding his scrotal mass.  He has kidney disease and is currently on dialysis.  He is here to discuss his next course of action.    Patients discharge summary : He was seen with a scrotal abscess, treated with IV vancomycin, followed by aspiration of 20 cc's of purulent material. He will be followed up with his urologist to consider surgery. He will be seeing his endocrinologist for parathyroid removal which is causing his calcium deposits.    Patient does not have a PCP at the moment.    Patient Active Problem List   Diagnosis Date Noted  . Scrotal abscess 02/26/2016  . Sebaceous cyst of scrotum 02/26/2016  . Cellulitis, scrotum 02/22/2016  . Abnormal nuclear stress test 12/15/2015  . ESRD (end stage renal disease) ([place]) 12/15/2015  . Other hypertrophic cardiomyopathy ([place]) 11/26/2015  . Atrial fibrillation ([place]) [I48.91] 11/17/2015  . Long term (current) use of anticoagulants [Z79.01] 11/17/2015  . Paroxysmal atrial fibrillation with RVR ([place]) 11/12/2015  . Anemia of chronic kidney failure 11/12/2015  . 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 Acute kidney injury ([place])   . Essential hypertension   . Metabolic acidosis 38/46/6599  . Protein-calorie malnutrition, severe ([place]) 09/20/2014  . AKI (acute kidney injury) ([place]) 09/20/2014    . Renal transplant recipient 09/20/2014  . Prostatitis, acute 09/19/2014   Past Medical History  Diagnosis Date  . Kidney transplant as cause of abnormal reaction or later complication   . Hypertension   . Nausea and vomiting     chronic  . Anemia   . Atrial fibrillation ([place])   . Heart murmur   . Paroxysmal atrial fibrillation with RVR ([place])   . GERD (gastroesophageal reflux disease)   . Renal disorder     S/P right transplant 2010  . ESRD (end stage renal disease) on dialysis [facility])     "[facility]; TTS since ~10/2014" (11/12/2015)   Past Surgical History  Procedure Laterality Date  . Kidney transplant Right 09/2009    "[facility]"  . Anterior cervical decomp/discectomy fusion  01/1999    C5-6   . Cardioversion    . Back surgery    . Colonoscopy w/ polypectomy  10/2009  . Esophagogastroduodenoscopy  10/2009  . Renal biopsy  2015  . Cardiac catheterization N/A 12/23/2015    Procedure: Left Heart Cath and Coronary Angiography;  Surgeon: Peter M Martinique, MD;  Location: [facility];  Service: Cardiovascular;  Laterality: N/A;   No Known Allergies Prior to Admission medications   Medication Sig Start Date End Date Taking? Authorizing Provider  amiodarone (PACERONE) 400 MG tablet Take 1 tablet (400 mg total) by mouth daily. 12/15/15  Yes Peter M Martinique, MD  cinacalcet [facility]) 30  MG tablet Take 30 mg by mouth daily with supper.  08/26/14  Yes Historical Provider, MD  HYDROcodone-acetaminophen (NORCO/VICODIN) 5-325 MG tablet Take 1 tablet by mouth every 6 (six) hours as needed for moderate pain. 02/26/16  Yes Nishant Dhungel, MD  metoprolol tartrate (LOPRESSOR) 25 MG tablet Take 1 tablet (25 mg total) by mouth 2 (two) times daily. 11/13/15  Yes Jule Ser, DO  predniSONE (DELTASONE) 10 MG tablet Take 10 mg by mouth daily.   Yes Historical Provider, MD  sevelamer carbonate (RENVELA) 800 MG tablet Take 800-1,600 mg by mouth See admin instructions. Takes 168m with  each meal and 8072mwith each snack   Yes Historical Provider, MD  warfarin (COUMADIN) 2 MG tablet Take 2 mg by mouth daily.   Yes Historical Provider, MD   Social History   Social History  . Marital Status: Legally Separated    Spouse Name: N/A  . Number of Children: N/A  . Years of Education: N/A   Occupational History  . Not on file.   Social History Main Topics  . Smoking status: Former Smoker -- 22 years    Types: Cigars  . Smokeless tobacco: Never Used     Comment: "stopped smoking in 1998"  . Alcohol Use: No  . Drug Use: No  . Sexual Activity: Not Currently   Other Topics Concern  . Not on file   Social History Narrative    Review of Systems  Constitutional: Negative for fever and chills.  Eyes: Negative for pain, redness and itching.  Respiratory: Negative for cough.   Gastrointestinal: Negative for nausea and vomiting.  Musculoskeletal: Negative for neck pain and neck stiffness.  Neurological: Negative for syncope and speech difficulty.       Objective:   Physical Exam Filed Vitals:   03/28/16 1119  BP: 140/80  Pulse: 60  Temp: 98 F (36.7 C)  TempSrc: Oral  Resp: 14  Height: 5' 10.25" (1.784 m)  Weight: 178 lb (80.74 kg)  SpO2: 100%    CONSTITUTIONAL: Well developed/well nourished HEAD: Normocephalic/atraumatic EYES: EOMI/PERRL ENMT: Mucous membranes moist NECK: supple no meningeal signs ABDOMEN: Palpable kidney in the right lower quadrant GUGB:EEFEOFHQalcium deposits in his scrotal sac, no evidence of infection.  NEURO: Pt is awake/alert/appropriate, moves all extremitiesx4.  No facial droop.   EXTREMITIES: pulses normal/equal, full ROM SKIN: warm, color normal PSYCH: no abnormalities of mood noted, alert and oriented to situation      Assessment & Plan:  Patient looks great. He scrotal abscess has resolved. [facility]. He needs referral to see Dr. McAlyson Inglesor  follow-up of his scrotal abscess.I personally performed the services described in this documentation, which was scribed in my presence. The recorded information has been reviewed and is accurate.     StDarlyne RussianMD

## 2016-03-30 ENCOUNTER — Ambulatory Visit (INDEPENDENT_AMBULATORY_CARE_PROVIDER_SITE_OTHER): Payer: Medicare Other | Admitting: Pharmacist

## 2016-03-30 DIAGNOSIS — Z7901 Long term (current) use of anticoagulants: Secondary | ICD-10-CM | POA: Diagnosis not present

## 2016-03-30 DIAGNOSIS — I480 Paroxysmal atrial fibrillation: Secondary | ICD-10-CM | POA: Diagnosis not present

## 2016-03-30 LAB — POCT INR: INR: 3.1

## 2016-04-13 ENCOUNTER — Ambulatory Visit (INDEPENDENT_AMBULATORY_CARE_PROVIDER_SITE_OTHER): Payer: Medicare Other | Admitting: Pharmacist

## 2016-04-13 ENCOUNTER — Encounter: Payer: Medicare Other | Admitting: Pharmacist Clinician (PhC)/ Clinical Pharmacy Specialist

## 2016-04-13 DIAGNOSIS — Z7901 Long term (current) use of anticoagulants: Secondary | ICD-10-CM | POA: Diagnosis not present

## 2016-04-13 DIAGNOSIS — I480 Paroxysmal atrial fibrillation: Secondary | ICD-10-CM

## 2016-04-13 LAB — POCT INR: INR: 3.7

## 2016-04-27 ENCOUNTER — Ambulatory Visit (INDEPENDENT_AMBULATORY_CARE_PROVIDER_SITE_OTHER): Payer: Medicare Other | Admitting: Pharmacist Clinician (PhC)/ Clinical Pharmacy Specialist

## 2016-04-27 DIAGNOSIS — I480 Paroxysmal atrial fibrillation: Secondary | ICD-10-CM

## 2016-04-27 DIAGNOSIS — Z7901 Long term (current) use of anticoagulants: Secondary | ICD-10-CM | POA: Diagnosis not present

## 2016-04-27 LAB — POCT INR: INR: 3.7

## 2016-05-11 ENCOUNTER — Ambulatory Visit (INDEPENDENT_AMBULATORY_CARE_PROVIDER_SITE_OTHER): Payer: Medicare Other | Admitting: Pharmacist Clinician (PhC)/ Clinical Pharmacy Specialist

## 2016-05-11 DIAGNOSIS — Z7901 Long term (current) use of anticoagulants: Secondary | ICD-10-CM | POA: Diagnosis not present

## 2016-05-11 DIAGNOSIS — I480 Paroxysmal atrial fibrillation: Secondary | ICD-10-CM

## 2016-05-11 LAB — POCT INR: INR: 2.1

## 2016-05-28 ENCOUNTER — Emergency Department (HOSPITAL_COMMUNITY)
Admission: EM | Admit: 2016-05-28 | Discharge: 2016-05-28 | Disposition: A | Discharge status: Home / Self Care | Payer: Medicare Other | Source: Home / Self Care | Attending: Emergency Medicine | Admitting: Emergency Medicine

## 2016-05-28 ENCOUNTER — Encounter (HOSPITAL_COMMUNITY): Payer: Self-pay

## 2016-05-28 DIAGNOSIS — I480 Paroxysmal atrial fibrillation: Secondary | ICD-10-CM | POA: Insufficient documentation

## 2016-05-28 DIAGNOSIS — Z7901 Long term (current) use of anticoagulants: Secondary | ICD-10-CM | POA: Insufficient documentation

## 2016-05-28 DIAGNOSIS — N186 End stage renal disease: Secondary | ICD-10-CM | POA: Insufficient documentation

## 2016-05-28 DIAGNOSIS — S3120XA Unspecified open wound of penis, initial encounter: Secondary | ICD-10-CM

## 2016-05-28 DIAGNOSIS — Z940 Kidney transplant status: Secondary | ICD-10-CM

## 2016-05-28 DIAGNOSIS — Y929 Unspecified place or not applicable: Secondary | ICD-10-CM | POA: Insufficient documentation

## 2016-05-28 DIAGNOSIS — Z992 Dependence on renal dialysis: Secondary | ICD-10-CM

## 2016-05-28 DIAGNOSIS — I120 Hypertensive chronic kidney disease with stage 5 chronic kidney disease or end stage renal disease: Secondary | ICD-10-CM | POA: Insufficient documentation

## 2016-05-28 DIAGNOSIS — Y939 Activity, unspecified: Secondary | ICD-10-CM | POA: Insufficient documentation

## 2016-05-28 DIAGNOSIS — Z79899 Other long term (current) drug therapy: Secondary | ICD-10-CM

## 2016-05-28 DIAGNOSIS — Z87891 Personal history of nicotine dependence: Secondary | ICD-10-CM

## 2016-05-28 DIAGNOSIS — X58XXXA Exposure to other specified factors, initial encounter: Secondary | ICD-10-CM

## 2016-05-28 DIAGNOSIS — Y999 Unspecified external cause status: Secondary | ICD-10-CM | POA: Insufficient documentation

## 2016-05-28 LAB — CBC
HEMATOCRIT: 41.2 % (ref 39.0–52.0)
HEMOGLOBIN: 12.7 g/dL — AB (ref 13.0–17.0)
MCH: 27.4 pg (ref 26.0–34.0)
MCHC: 30.8 g/dL (ref 30.0–36.0)
MCV: 88.8 fL (ref 78.0–100.0)
Platelets: 247 10*3/uL (ref 150–400)
RBC: 4.64 MIL/uL (ref 4.22–5.81)
RDW: 19.9 % — ABNORMAL HIGH (ref 11.5–15.5)
WBC: 9.9 10*3/uL (ref 4.0–10.5)

## 2016-05-28 LAB — HEPATIC FUNCTION PANEL
ALT: 32 U/L (ref 17–63)
AST: 31 U/L (ref 15–41)
Albumin: 3.1 g/dL — ABNORMAL LOW (ref 3.5–5.0)
Alkaline Phosphatase: 407 U/L — ABNORMAL HIGH (ref 38–126)
Bilirubin, Direct: 0.1 mg/dL (ref 0.1–0.5)
Indirect Bilirubin: 0.3 mg/dL (ref 0.3–0.9)
Total Bilirubin: 0.4 mg/dL (ref 0.3–1.2)
Total Protein: 8.8 g/dL — ABNORMAL HIGH (ref 6.5–8.1)

## 2016-05-28 LAB — BASIC METABOLIC PANEL
ANION GAP: 14 (ref 5–15)
BUN: 9 mg/dL (ref 6–20)
CALCIUM: 7.7 mg/dL — AB (ref 8.9–10.3)
CO2: 24 mmol/L (ref 22–32)
Chloride: 97 mmol/L — ABNORMAL LOW (ref 101–111)
Creatinine, Ser: 4.76 mg/dL — ABNORMAL HIGH (ref 0.61–1.24)
GFR, EST AFRICAN AMERICAN: 15 mL/min — AB (ref >60)
GFR, EST NON AFRICAN AMERICAN: 13 mL/min — AB (ref >60)
GLUCOSE: 107 mg/dL — AB (ref 65–99)
POTASSIUM: 4.9 mmol/L (ref 3.5–5.1)
Sodium: 135 mmol/L (ref 135–145)

## 2016-05-28 LAB — ALBUMIN: ALBUMIN: 3.1 g/dL — AB (ref 3.5–5.0)

## 2016-05-28 LAB — PROTIME-INR
INR: 1.77 — ABNORMAL HIGH (ref 0.00–1.49)
Prothrombin Time: 20.5 s — ABNORMAL HIGH (ref 11.6–15.2)

## 2016-05-28 MED ORDER — MORPHINE SULFATE (PF) 4 MG/ML IV SOLN
4.0000 mg | Freq: Once | INTRAVENOUS | Status: AC
  Administered 2016-05-28: 4 mg via INTRAVENOUS
  Filled 2016-05-28: qty 1

## 2016-05-28 MED ORDER — HYDROMORPHONE HCL 1 MG/ML IJ SOLN
1.0000 mg | Freq: Once | INTRAMUSCULAR | Status: AC
  Administered 2016-05-28: 1 mg via INTRAVENOUS
  Filled 2016-05-28: qty 1

## 2016-05-28 MED ORDER — OXYCODONE-ACETAMINOPHEN 5-325 MG PO TABS: 2.0000 | ORAL_TABLET | ORAL | Status: AC | PRN

## 2016-05-28 MED ORDER — SODIUM CHLORIDE 0.9 % IV SOLN
1.0000 g | Freq: Once | INTRAVENOUS | Status: AC
  Administered 2016-05-28: 1 g via INTRAVENOUS
  Filled 2016-05-28: qty 10

## 2016-05-28 MED ORDER — OXYCODONE HCL 5 MG PO TABS
10.0000 mg | ORAL_TABLET | Freq: Once | ORAL | Status: AC
  Administered 2016-05-28: 10 mg via ORAL
  Filled 2016-05-28: qty 2

## 2016-05-28 NOTE — ED Provider Notes (Signed)
CSN: [id number]     Arrival date & time 05/28/16  1118 History   First MD Initiated Contact with Patient 05/28/16 1248     Chief Complaint  Patient presents with  . low calcium/post dialysis      (Consider location/radiation/quality/duration/timing/severity/associated sxs/prior Treatment) HPI   Carmel Waddington is a 55 year old male with a past medical history of paroxysmal A. fib, HTN, ESRD status post kidney transplant on hemodialysis, hypocalcemia status post parathyroidectomy who presents the ED today complaining of low calcium and penile pain. Patient states that he had his parathyroid removed on 6/28 due to hypercalcemia and multiple calcium deposits over body. Since this removal he has had issues with hypocalcemia. He has been taking calcium supplements with minimal relief. He wasAt dialysis this morning and was told that his calcium was 5.6. They gave him a calcium bath during his dialysis session and then symptoms to the ED for further evaluation. patient denies any muscle twitching or spasticity. No tremors. Patient also complaining of severe penile pain. Patient states that the day prior to his parathyroidectomy he had a "small neck" to his urethral meatus. Patient had a Foley catheter placed during surgery. He states that since then he has developed a significant infection in this area. Area is very painful and it looks like there is an open wound. He does not make any urine so at this time does not have any dysuria.  Per endocrinology notes:  ______________________________________________________________________ Patient with history of significant calcium deposits causing pain to patient, most noted in scrotum. Patient presented for scheduled subtotal parathyroidectomy performed on 6/28. Four markedly enlarged parathyroid glands were noted and confirmed via frozen section intraoperatively. Approximately 1/2 of right inferior gland was left in situ and marked with a clip. Baseline iPTH was  3183 and post-excision was 360. Patient with well controlled pain with lidocaine gel and oxycodone solution, which he was provided with prescription on discharge. He does note mild hoarseness that remains unchanged since the procedure. Diet was able to be advanced to regular diet and to continue with routine wound care with steristrips in place and ice pack to neck. Calcium levels remained relatively stable with ionized calcium remaining above 0.80. He did require multiple doses of IV calcium gluconate 1 gram, however none for over 48 hours prior to discharge. Serum calcium remained stable with a low of 7.7 and was 8.5 on discharge. He was discharged on calcitriol 1.25 mcg BID, TUMS 2 g TID between meals, and Phoslo 667 mg TID. Sensipar was discontinued prior to discharge. Post operative follow-up to be arranged by surgical team.   Past Medical History  Diagnosis Date  . Kidney transplant as cause of abnormal reaction or later complication   . Hypertension   . Nausea and vomiting     chronic  . Anemia   . Atrial fibrillation (HCC)   . Heart murmur   . Paroxysmal atrial fibrillation with RVR (HCC)   . GERD (gastroesophageal reflux disease)   . Renal disorder     S/P right transplant 2010  . ESRD (end stage renal disease) on dialysis [facility])     "[facility]; TTS since ~10/2014" (11/12/2015)   Past Surgical History  Procedure Laterality Date  . Kidney transplant Right 09/2009    "[facility]"  . Anterior cervical decomp/discectomy fusion  01/1999    C5-6   . Cardioversion    . Back surgery    . Colonoscopy w/ polypectomy  10/2009  . Esophagogastroduodenoscopy  10/2009  .  Renal biopsy  2015  . Cardiac catheterization N/A 12/23/2015    Procedure: Left Heart Cath and Coronary Angiography;  Surgeon: Peter M Swaziland, MD;  Location: [facility] INVASIVE CV LAB;  Service: Cardiovascular;  Laterality: N/A;   Family History  Problem Relation Age of Onset  . Hypertension Father    Social History    Substance Use Topics  . Smoking status: Former Smoker -- 22 years    Types: Cigars  . Smokeless tobacco: Never Used     Comment: "stopped smoking in 1998"  . Alcohol Use: No    Review of Systems  All other systems reviewed and are negative.     Allergies  Review of patient's allergies indicates no known allergies.  Home Medications   Prior to Admission medications   Medication Sig Start Date End Date Taking? Authorizing Provider  amiodarone (PACERONE) 400 MG tablet Take 1 tablet (400 mg total) by mouth daily. 12/15/15  Yes Peter M Swazil[place], MD  calcitRIOL (ROCALTROL) 0.25 MCG capsule Take 1.25 mcg by mouth 2 (two) times daily. 05/22/16  Yes Historical Provider, MD  calcium acetate (PHOSLO) 667 MG capsule Take 667 mg by mouth 3 (three) times daily with meals.   Yes Historical Provider, MD  calcium carbonate (TUMS - DOSED IN MG ELEMENTAL CALCIUM) 500 MG chewable tablet Chew 4 tablets by mouth 3 (three) times daily with meals.   Yes Historical Provider, MD  HYDROcodone-acetaminophen (NORCO/VICODIN) 5-325 MG tablet Take 1 tablet by mouth every 6 (six) hours as needed for moderate pain. 02/26/16  Yes Nishant Dhungel, MD  lidocaine (XYLOCAINE) 2 % jelly Place 1 application into the urethra as needed (for pain).    Yes Historical Provider, MD  metoprolol tartrate (LOPRESSOR) 25 MG tablet Take 1 tablet (25 mg total) by mouth 2 (two) times daily. 11/13/15  Yes [name], DO  oxyCODONE (ROXICODONE) 5 MG/5ML solution Take 5 mg by mouth daily as needed for breakthrough pain.  05/22/16  Yes Historical Provider, MD  predniSONE (DELTASONE) 10 MG tablet Take 10 mg by mouth daily.   Yes Historical Provider, MD  warfarin (COUMADIN) 2 MG tablet Take 1 tablet by mouth daily or as directed by coumadin clinic Patient taking differently: Take 1-2 mg by mouth daily at 6 PM. Takes 1/2 tab on sun only Takes 1 tab all other days 03/28/16  Yes Peter M [place]az[place], MD   BP 141/94 mmHg  Pulse 81  Temp(Src) 98.5 F  (36.9 C)  Resp 24  SpO2 98% Physical Exam  Constitutional: He is oriented to person, place, and time. He appears well-developed and well-nourished. No distress.  HENT:  Head: Normocephalic and atraumatic.  Mouth/Throat: No oropharyngeal exudate.  Eyes: Conjunctivae and EOM are normal. Pupils are equal, round, and reactive to light. Right eye exhibits no discharge. Left eye exhibits no discharge. No scleral icterus.  Cardiovascular: Normal rate, regular rhythm, normal heart sounds and intact distal pulses.  Exam reveals no gallop and no friction rub.   No murmur heard. Pulmonary/Chest: Effort normal and breath sounds normal. No respiratory distress. He has no wheezes. He has no rales. He exhibits no tenderness.  Abdominal: Soft. He exhibits no distension. There is tenderness ( RLQ TTP, chronic from kidney transplant). There is no guarding.  Genitourinary: Penile tenderness present.  See image   Musculoskeletal: Normal range of motion. He exhibits no edema.  Neurological: He is alert and oriented to person, place, and time. He has normal reflexes.  Skin: Skin is warm and dry.  No rash noted. He is not diaphoretic. No erythema. No pallor.  Psychiatric: He has a normal mood and affect. His behavior is normal.  Nursing note and vitals reviewed.        ED Course  Procedures (including critical care time) Labs Review Labs Reviewed  BASIC METABOLIC PANEL - Abnormal; Notable for the following:    Chloride 97 (*)    Glucose, Bld 107 (*)    Creatinine, Ser 4.76 (*)    Calcium 7.7 (*)    GFR calc non Af Amer 13 (*)    GFR calc Af Amer 15 (*)    All other components within normal limits  CBC - Abnormal; Notable for the following:    Hemoglobin 12.7 (*)    RDW 19.9 (*)    All other components within normal limits  ALBUMIN - Abnormal; Notable for the following:    Albumin 3.1 (*)    All other components within normal limits  PROTIME-INR - Abnormal; Notable for the following:     Prothrombin Time 20.5 (*)    INR 1.77 (*)    All other components within normal limits  CALCIUM, IONIZED  RPR  HIV ANTIBODY (ROUTINE TESTING)  HEPATIC FUNCTION PANEL    Imaging Review No results found. I have personally reviewed and evaluated these images and lab results as part of my medical decision-making.   EKG Interpretation None      MDM   Final diagnoses:  Wound, open, penis, initial encounter  Hypocalcemia   55 year old male with past medical history of ESRD status post kidney transplant on hemodialysis, hypocalcemia status post parathyroidectomy presents to the ED today complaining of hypocalcemia and penile pain. Patient was sent to ED from dialysis because he was told his calcium is too low. He was told that it was 5.6 prior to arrival. On our laboratory values his calcium level is 7.7. Patient has low albumin at 3.1. Corrected calcium level is 8.4. He was given 1 g of IV calcium gluconate in the ED. Creatinine is still elevated at 4.76 this is significantly improved from previous lab values. He is mildly subtherapeutic INR at 1.77 the patient just began taking his Coumadin again after his surgery. Suspect this is reason for subtherapeutic level. All other lab work otherwise at baseline or within normal limits.  3:18 PM Spoke with Dr.Wrenn with urology regarding patient's penile pain and wound. 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 No antibiotics indicated. This needs to be treated as a burn with proper wound care. Discussed this with patient who expresses understanding and he'll follow up with urology outpatient. Patient's pain managed in ED. We'll also do see home with prescription for pain medication. Recommend follow-up with endocrinology for calcium supplementation medication management and reevaluation. Will DC patient to home after calcium gluconate infusion.  Patient was discussed with  and seen by Dr. Denton LankSteinl who agrees with the treatment plan.     Lester KinsmanSamantha Tripp [place], PA-C 05/28/16 1640  Cathren LaineKevin Steinl, MD 05/30/16 [phone]

## 2016-05-28 NOTE — Discharge Instructions (Signed)
Hypocalcemia, Adult Hypocalcemia is low blood calcium. Calcium is important for cells to function in the body. Low blood calcium can cause a variety of symptoms and problems. CAUSES   Low levels of a body protein called albumin.  Problems with the parathyroid glands or surgical removal of the parathyroid glands. The parathyroid glands maintain the body's level of calcium.  Decreased production or improper use of parathyroid hormone.  Lack (deficiency) of vitamin D or magnesium or both.  Intestinal problems that interfere with nutrient absorption.  Alcoholism.  Kidney problems.  Inflammation of the pancreas (pancreatitis).  Certain medicines.  Severe infections (sepsis).  Infiltrative diseases. With these diseases the parathyroid glands are filled with cells or substances that are not normally present. Examples include:  Sarcoidosis.  Hemachromatosis.  Breakdown of large amounts of muscle fiber.  High levels of phosphate in the body.  Cancer.  Massive blood transfusions which usually occur with severe trauma. SYMPTOMS   Numbness and tingling in the fingers, toes, or around the mouth.  Muscle aches or cramps, especially in the legs, feet, and back.  Muscle twitches.  Shortness of breath or wheezing.  Difficulty swallowing.  Changes in the sound of the voice.  General weakness.  Fainting.  Fast heart beats (palpitations).  Chest pain.  Irritability.  Difficulty thinking.  Memory problems or confusion.  Severe fatigue.  Changes in personality.  Depression and anxiety.  Shaking uncontrollably (seizures).  Coarse, brittle hair and nails.  Dry skin or lasting (chronic) skin diseases (psoriasis, eczema, or dermatitis).  Clouding of the eye lens (cataracts).  Abdominal cramping or pain. DIAGNOSIS  Hypocalcemia is usually diagnosed through blood tests that reveal a low level of blood calcium. Other tests, such as a recording of the electrical  activity of the heart (electrocardiogram, EKG), may be performed in order to diagnose the underlying cause of the condition. TREATMENT  Treatment for hypocalcemia includes giving calcium supplements. These can be given by mouth or by intravenous (IV) access tube, depending on the severity of the symptoms and deficiency. Other minerals (electrolytes), such as magnesium, may also be given. HOME CARE INSTRUCTIONS   Meet with a dietitian to make sure you are eating the most healthful diet possible, or follow diet instructions as directed by your caregiver.  Follow up with your caregiver as directed. SEEK IMMEDIATE MEDICAL CARE IF:   You develop chest pain.  You develop persistent rapid or irregular heartbeats.  You have difficulty breathing.  You faint.  You develop increased fatigue.  You have new swelling in the feet, ankles, or legs.  You develop increased muscle twitching.  You start to have seizures.  You develop confusion.  You develop mood, memory, or personality changes. MAKE SURE YOU:   Understand these instructions.  Will watch your condition.  Will get help right away if you are not doing well or get worse.   This information is not intended to replace advice given to you by your health care provider. Make sure you discuss any questions you have with your health care provider.   Document Released: 04/27/2010 Document Revised: 01/30/2012 Document Reviewed: 03/25/2015 Elsevier Interactive Patient Education 2016 [employer]  Wound Care Taking care of your wound properly can help to prevent pain and infection. It can also help your wound to heal more quickly.  HOW TO CARE FOR YOUR WOUND  Take or apply over-the-counter and prescription medicines only as told by your health care provider.  If you were prescribed antibiotic medicine, take or apply  it as told by your health care provider. Do not stop using the antibiotic even if your condition improves.  Clean the  wound each day or as told by your health care provider.  Wash the wound with mild soap and water.  Rinse the wound with water to remove all soap.  Pat the wound dry with a clean towel. Do not rub it.  There are many different ways to close and cover a wound. For example, a wound can be covered with stitches (sutures), skin glue, or adhesive strips. Follow instructions from your health care provider about:  How to take care of your wound.  When and how you should change your bandage (dressing).  When you should remove your dressing.  Removing whatever was used to close your wound.  Check your wound every day for signs of infection. Watch for:  Redness, swelling, or pain.  Fluid, blood, or pus.  Keep the dressing dry until your health care provider says it can be removed. Do not take baths, swim, use a hot tub, or do anything that would put your wound underwater until your health care provider approves.  Raise (elevate) the injured area above the level of your heart while you are sitting or lying down.  Do not scratch or pick at the wound.  Keep all follow-up visits as told by your health care provider. This is important. SEEK MEDICAL CARE IF:  You received a tetanus shot and you have swelling, severe pain, redness, or bleeding at the injection site.  You have a fever.  Your pain is not controlled with medicine.  You have increased redness, swelling, or pain at the site of your wound.  You have fluid, blood, or pus coming from your wound.  You notice a bad smell coming from your wound or your dressing. SEEK IMMEDIATE MEDICAL CARE IF:  You have a red streak going away from your wound.   This information is not intended to replace advice given to you by your health care provider. Make sure you discuss any questions you have with your health care provider.   Follow up with urology for further evaluation of genital wound. Treat like a burn. Apply antibiotic ointment.  Contact your endocrinologist regarding calcium supplements and medication management. Take home calcium supplements as prescribed. Return to the ED if you experience severe muscle twitching, chest pain, difficulty breathing, increased or spreading of wound in groin.

## 2016-05-28 NOTE — ED Notes (Signed)
Patient here post dialysis. Was told to come to ED for low calcium and also rash and pain to penis that he thinks related to calcium

## 2016-05-29 LAB — SYPHILIS: RPR W/REFLEX TO RPR TITER AND TREPONEMAL ANTIBODIES, TRADITIONAL SCREENING AND DIAGNOSIS ALGORITHM: RPR Ser Ql: NONREACTIVE

## 2016-05-29 LAB — CALCIUM, IONIZED: CALCIUM, IONIZED, SERUM: 4 mg/dL — AB (ref 4.5–5.6)

## 2016-05-29 LAB — HIV ANTIBODY (ROUTINE TESTING W REFLEX): HIV Screen 4th Generation wRfx: NONREACTIVE

## 2016-05-31 ENCOUNTER — Encounter (HOSPITAL_COMMUNITY): Payer: Self-pay

## 2016-05-31 ENCOUNTER — Emergency Department (HOSPITAL_COMMUNITY): Payer: Medicare Other

## 2016-05-31 ENCOUNTER — Inpatient Hospital Stay (HOSPITAL_COMMUNITY)
Admission: EM | Admit: 2016-05-31 | Discharge: 2016-06-04 | DRG: 640 | Disposition: A | Discharge status: Home / Self Care | Payer: Medicare Other | Attending: Family Medicine | Admitting: Family Medicine

## 2016-05-31 DIAGNOSIS — E890 Postprocedural hypothyroidism: Secondary | ICD-10-CM | POA: Diagnosis present

## 2016-05-31 DIAGNOSIS — Z992 Dependence on renal dialysis: Secondary | ICD-10-CM | POA: Diagnosis not present

## 2016-05-31 DIAGNOSIS — N186 End stage renal disease: Secondary | ICD-10-CM

## 2016-05-31 DIAGNOSIS — D631 Anemia in chronic kidney disease: Secondary | ICD-10-CM | POA: Diagnosis present

## 2016-05-31 DIAGNOSIS — Z7901 Long term (current) use of anticoagulants: Secondary | ICD-10-CM | POA: Diagnosis not present

## 2016-05-31 DIAGNOSIS — I5022 Chronic systolic (congestive) heart failure: Secondary | ICD-10-CM | POA: Diagnosis present

## 2016-05-31 DIAGNOSIS — Z7952 Long term (current) use of systemic steroids: Secondary | ICD-10-CM

## 2016-05-31 DIAGNOSIS — J849 Interstitial pulmonary disease, unspecified: Secondary | ICD-10-CM | POA: Diagnosis present

## 2016-05-31 DIAGNOSIS — I422 Other hypertrophic cardiomyopathy: Secondary | ICD-10-CM | POA: Diagnosis present

## 2016-05-31 DIAGNOSIS — N485 Ulcer of penis: Secondary | ICD-10-CM | POA: Diagnosis present

## 2016-05-31 DIAGNOSIS — I132 Hypertensive heart and chronic kidney disease with heart failure and with stage 5 chronic kidney disease, or end stage renal disease: Secondary | ICD-10-CM | POA: Diagnosis present

## 2016-05-31 DIAGNOSIS — Z981 Arthrodesis status: Secondary | ICD-10-CM

## 2016-05-31 DIAGNOSIS — Z79899 Other long term (current) drug therapy: Secondary | ICD-10-CM

## 2016-05-31 DIAGNOSIS — Z87891 Personal history of nicotine dependence: Secondary | ICD-10-CM

## 2016-05-31 DIAGNOSIS — I959 Hypotension, unspecified: Secondary | ICD-10-CM | POA: Diagnosis not present

## 2016-05-31 DIAGNOSIS — Z8249 Family history of ischemic heart disease and other diseases of the circulatory system: Secondary | ICD-10-CM | POA: Diagnosis not present

## 2016-05-31 DIAGNOSIS — E892 Postprocedural hypoparathyroidism: Secondary | ICD-10-CM | POA: Diagnosis present

## 2016-05-31 DIAGNOSIS — K219 Gastro-esophageal reflux disease without esophagitis: Secondary | ICD-10-CM | POA: Diagnosis present

## 2016-05-31 DIAGNOSIS — I480 Paroxysmal atrial fibrillation: Secondary | ICD-10-CM | POA: Diagnosis present

## 2016-05-31 DIAGNOSIS — E875 Hyperkalemia: Secondary | ICD-10-CM | POA: Diagnosis present

## 2016-05-31 DIAGNOSIS — T8611 Kidney transplant rejection: Secondary | ICD-10-CM | POA: Diagnosis present

## 2016-05-31 LAB — BASIC METABOLIC PANEL WITH GFR
Anion gap: 18 — ABNORMAL HIGH (ref 5–15)
BUN: 35 mg/dL — ABNORMAL HIGH (ref 6–20)
CO2: 21 mmol/L — ABNORMAL LOW (ref 22–32)
Calcium: 5.2 mg/dL — CL (ref 8.9–10.3)
Chloride: 95 mmol/L — ABNORMAL LOW (ref 101–111)
Creatinine, Ser: 13.34 mg/dL — ABNORMAL HIGH (ref 0.61–1.24)
GFR calc Af Amer: 4 mL/min — ABNORMAL LOW
GFR calc non Af Amer: 4 mL/min — ABNORMAL LOW
Glucose, Bld: 74 mg/dL (ref 65–99)
Potassium: 6 mmol/L — ABNORMAL HIGH (ref 3.5–5.1)
Sodium: 134 mmol/L — ABNORMAL LOW (ref 135–145)

## 2016-05-31 LAB — HEPATIC FUNCTION PANEL
ALT: 22 U/L (ref 17–63)
AST: 25 U/L (ref 15–41)
Albumin: 3 g/dL — ABNORMAL LOW (ref 3.5–5.0)
Alkaline Phosphatase: 448 U/L — ABNORMAL HIGH (ref 38–126)
Bilirubin, Direct: 0.3 mg/dL (ref 0.1–0.5)
Indirect Bilirubin: 0.6 mg/dL (ref 0.3–0.9)
Total Bilirubin: 0.9 mg/dL (ref 0.3–1.2)
Total Protein: 8.5 g/dL — ABNORMAL HIGH (ref 6.5–8.1)

## 2016-05-31 LAB — PROTIME-INR
INR: 1.77 — ABNORMAL HIGH (ref 0.00–1.49)
Prothrombin Time: 20.6 s — ABNORMAL HIGH (ref 11.6–15.2)

## 2016-05-31 LAB — CBC WITH DIFFERENTIAL/PLATELET
Basophils Absolute: 0.1 10*3/uL (ref 0.0–0.1)
Basophils Relative: 0 %
Eosinophils Absolute: 0.9 10*3/uL — ABNORMAL HIGH (ref 0.0–0.7)
Eosinophils Relative: 8 %
HCT: 40.6 % (ref 39.0–52.0)
Hemoglobin: 12.7 g/dL — ABNORMAL LOW (ref 13.0–17.0)
Lymphocytes Relative: 21 %
Lymphs Abs: 2.6 10*3/uL (ref 0.7–4.0)
MCH: 27.5 pg (ref 26.0–34.0)
MCHC: 31.3 g/dL (ref 30.0–36.0)
MCV: 88.1 fL (ref 78.0–100.0)
Monocytes Absolute: 1.4 10*3/uL — ABNORMAL HIGH (ref 0.1–1.0)
Monocytes Relative: 12 %
Neutro Abs: 7.3 10*3/uL (ref 1.7–7.7)
Neutrophils Relative %: 59 %
Platelets: 312 10*3/uL (ref 150–400)
RBC: 4.61 MIL/uL (ref 4.22–5.81)
RDW: 19.9 % — ABNORMAL HIGH (ref 11.5–15.5)
WBC: 12.2 10*3/uL — ABNORMAL HIGH (ref 4.0–10.5)

## 2016-05-31 LAB — BASIC METABOLIC PANEL
Anion gap: 23 — ABNORMAL HIGH (ref 5–15)
BUN: 39 mg/dL — AB (ref 6–20)
CHLORIDE: 97 mmol/L — AB (ref 101–111)
CO2: 16 mmol/L — ABNORMAL LOW (ref 22–32)
CREATININE: 14.03 mg/dL — AB (ref 0.61–1.24)
Calcium: 5.3 mg/dL — CL (ref 8.9–10.3)
GFR calc Af Amer: 4 mL/min — ABNORMAL LOW (ref >60)
GFR calc non Af Amer: 3 mL/min — ABNORMAL LOW (ref >60)
GLUCOSE: 53 mg/dL — AB (ref 65–99)
POTASSIUM: 6.4 mmol/L — AB (ref 3.5–5.1)
Sodium: 136 mmol/L (ref 135–145)

## 2016-05-31 LAB — PHOSPHORUS: Phosphorus: 5.7 mg/dL — ABNORMAL HIGH (ref 2.5–4.6)

## 2016-05-31 LAB — GLUCOSE, CAPILLARY
GLUCOSE-CAPILLARY: 95 mg/dL (ref 65–99)
GLUCOSE-CAPILLARY: 99 mg/dL (ref 65–99)
Glucose-Capillary: 93 mg/dL (ref 65–99)

## 2016-05-31 LAB — LIPASE, BLOOD: Lipase: 21 U/L (ref 11–51)

## 2016-05-31 LAB — CBG MONITORING, ED: GLUCOSE-CAPILLARY: 57 mg/dL — AB (ref 65–99)

## 2016-05-31 LAB — MAGNESIUM: Magnesium: 1.8 mg/dL (ref 1.7–2.4)

## 2016-05-31 MED ORDER — DEXTROSE-NACL 5-0.45 % IV SOLN
INTRAVENOUS | Status: DC
  Administered 2016-05-31 – 2016-06-03 (×5): via INTRAVENOUS
  Filled 2016-05-31 (×8): qty 1000

## 2016-05-31 MED ORDER — PENTAFLUOROPROP-TETRAFLUOROETH EX AERO: 1.0000 "application " | INHALATION_SPRAY | CUTANEOUS | Status: DC | PRN

## 2016-05-31 MED ORDER — WARFARIN - PHARMACIST DOSING INPATIENT
Freq: Every day | Status: DC
  Administered 2016-06-03: 17:00:00

## 2016-05-31 MED ORDER — SODIUM CHLORIDE 0.9 % IV SOLN: 100.0000 mL | INTRAVENOUS | Status: DC | PRN

## 2016-05-31 MED ORDER — MORPHINE SULFATE (PF) 2 MG/ML IV SOLN
4.0000 mg | INTRAVENOUS | Status: DC | PRN
  Administered 2016-05-31 – 2016-06-01 (×3): 4 mg via INTRAVENOUS
  Filled 2016-05-31 (×2): qty 2

## 2016-05-31 MED ORDER — CALCIUM GLUCONATE 10 % IV SOLN
1.0000 g | Freq: Once | INTRAVENOUS | Status: AC
  Administered 2016-05-31: 1 g via INTRAVENOUS
  Filled 2016-05-31: qty 10

## 2016-05-31 MED ORDER — HEPARIN SODIUM (PORCINE) 1000 UNIT/ML DIALYSIS: 1000.0000 [IU] | INTRAMUSCULAR | Status: DC | PRN

## 2016-05-31 MED ORDER — DOXERCALCIFEROL 4 MCG/2ML IV SOLN
INTRAVENOUS | Status: AC
  Administered 2016-05-31: 6 ug via INTRAVENOUS
  Filled 2016-05-31: qty 4

## 2016-05-31 MED ORDER — LIDOCAINE-PRILOCAINE 2.5-2.5 % EX CREA: 1.0000 "application " | TOPICAL_CREAM | CUTANEOUS | Status: DC | PRN

## 2016-05-31 MED ORDER — SODIUM CHLORIDE 0.9 % IV SOLN
1.0000 g | Freq: Once | INTRAVENOUS | Status: AC
  Administered 2016-05-31: 1 g via INTRAVENOUS
  Filled 2016-05-31 (×2): qty 10

## 2016-05-31 MED ORDER — DOXERCALCIFEROL 4 MCG/2ML IV SOLN
6.0000 ug | Freq: Every day | INTRAVENOUS | Status: DC
  Administered 2016-05-31 – 2016-06-01 (×2): 6 ug via INTRAVENOUS
  Filled 2016-05-31 (×3): qty 4

## 2016-05-31 MED ORDER — MORPHINE SULFATE (PF) 2 MG/ML IV SOLN
2.0000 mg | INTRAVENOUS | Status: AC
  Administered 2016-05-31: 2 mg via INTRAVENOUS
  Filled 2016-05-31: qty 1

## 2016-05-31 MED ORDER — PROMETHAZINE HCL 25 MG/ML IJ SOLN
12.5000 mg | INTRAMUSCULAR | Status: AC
  Administered 2016-05-31: 12.5 mg via INTRAVENOUS
  Filled 2016-05-31: qty 1

## 2016-05-31 MED ORDER — LIDOCAINE HCL (PF) 1 % IJ SOLN: 5.0000 mL | INTRAMUSCULAR | Status: DC | PRN

## 2016-05-31 MED ORDER — CALCITRIOL 0.5 MCG PO CAPS
1.2500 ug | ORAL_CAPSULE | Freq: Two times a day (BID) | ORAL | Status: DC
  Filled 2016-05-31: qty 1

## 2016-05-31 MED ORDER — ALTEPLASE 2 MG IJ SOLR
2.0000 mg | Freq: Once | INTRAMUSCULAR | Status: DC | PRN
Start: 2016-05-31 — End: 2016-05-31

## 2016-05-31 MED ORDER — MORPHINE SULFATE (PF) 4 MG/ML IV SOLN
INTRAVENOUS | Status: AC
  Filled 2016-05-31: qty 1

## 2016-05-31 MED ORDER — PROMETHAZINE HCL 25 MG/ML IJ SOLN
12.5000 mg | Freq: Four times a day (QID) | INTRAMUSCULAR | Status: DC | PRN
  Administered 2016-06-01 – 2016-06-04 (×8): 12.5 mg via INTRAVENOUS
  Filled 2016-05-31 (×7): qty 1

## 2016-05-31 MED ORDER — CALCIUM CARBONATE ANTACID 500 MG PO CHEW
800.0000 mg | CHEWABLE_TABLET | Freq: Three times a day (TID) | ORAL | Status: DC
  Filled 2016-05-31: qty 4

## 2016-05-31 MED ORDER — OXYCODONE-ACETAMINOPHEN 5-325 MG PO TABS
2.0000 | ORAL_TABLET | ORAL | Status: DC | PRN
  Administered 2016-06-01 – 2016-06-04 (×12): 2 via ORAL
  Filled 2016-05-31 (×11): qty 2

## 2016-05-31 MED ORDER — CALCIUM CARBONATE ANTACID 500 MG PO CHEW
800.0000 mg | CHEWABLE_TABLET | Freq: Every day | ORAL | Status: DC
  Administered 2016-06-01 – 2016-06-02 (×2): 800 mg via ORAL
  Filled 2016-05-31 (×3): qty 4

## 2016-05-31 MED ORDER — ONDANSETRON HCL 4 MG/2ML IJ SOLN
4.0000 mg | Freq: Once | INTRAMUSCULAR | Status: AC
  Administered 2016-05-31: 4 mg via INTRAVENOUS
  Filled 2016-05-31: qty 2

## 2016-05-31 MED ORDER — MORPHINE SULFATE (PF) 4 MG/ML IV SOLN
4.0000 mg | Freq: Once | INTRAVENOUS | Status: AC
  Administered 2016-05-31: 4 mg via INTRAVENOUS
  Filled 2016-05-31: qty 1

## 2016-05-31 MED ORDER — WARFARIN SODIUM 2.5 MG PO TABS
2.5000 mg | ORAL_TABLET | Freq: Once | ORAL | Status: AC
  Administered 2016-06-01: 2.5 mg via ORAL
  Filled 2016-05-31: qty 1

## 2016-05-31 NOTE — ED Provider Notes (Signed)
CSN: [id number]     Arrival date & time 05/31/16  1011 History   First MD Initiated Contact with Patient 05/31/16 1157     Chief Complaint  Patient presents with  . Weakness  . Nausea     (Consider location/radiation/quality/duration/timing/severity/associated sxs/prior Treatment) HPI   Samuel Rowland is a 55 year old male with a past medical history of ESRD on hemodialysis status post kidney transplant, HTN, paroxysmal atrial fibrillation, hypocalcemia status post parathyroidectomy who presents the ED today complaining of weakness. Patient seen in the ED 3 days ago for hypocalcemia and the wound on the distal tip of his penis. Wound was thought to be secondary to vascular necrosis from hemodialysis. Patient was unable to follow-up with his urologist or his endocrinologist. Since his discharge he states he has become increasingly more weak and fatigued. Patient states he has been unable to tolerate his calcium supplements as he has had issues swallowing them. His last dialysis session was Saturday. He was scheduled to go again today but was too weak so he came to the ER for further evaluation. He is still complaining of ongoing pain in his distal penis despite applying lidocaine gel and doing proper wound care. Patient reports chronic right-sided flank/abdominal pain since his kidney transplant. Patient does not make any urine. He denies any fevers, chills, chest pain or shortness of breath. Patient has been having some muscle twitches in his hands.  Past Medical History  Diagnosis Date  . Kidney transplant as cause of abnormal reaction or later complication   . Hypertension   . Nausea and vomiting     chronic  . Anemia   . Atrial fibrillation (HCC)   . Heart murmur   . Paroxysmal atrial fibrillation with RVR (HCC)   . GERD (gastroesophageal reflux disease)   . Renal disorder     S/P right transplant 2010  . ESRD (end stage renal disease) on dialysis [facility])     "[facility]; TTS  since ~10/2014" (11/12/2015)   Past Surgical History  Procedure Laterality Date  . Kidney transplant Right 09/2009    "[facility]"  . Anterior cervical decomp/discectomy fusion  01/1999    C5-6   . Cardioversion    . Back surgery    . Colonoscopy w/ polypectomy  10/2009  . Esophagogastroduodenoscopy  10/2009  . Renal biopsy  2015  . Cardiac catheterization N/A 12/23/2015    Procedure: Left Heart Cath and Coronary Angiography;  Surgeon: Peter M Swaziland, MD;  Location: [facility] INVASIVE CV LAB;  Service: Cardiovascular;  Laterality: N/A;   Family History  Problem Relation Age of Onset  . Hypertension Father    Social History  Substance Use Topics  . Smoking status: Former Smoker -- 22 years    Types: Cigars  . Smokeless tobacco: Never Used     Comment: "stopped smoking in 1998"  . Alcohol Use: No    Review of Systems  All other systems reviewed and are negative.     Allergies  Review of patient's allergies indicates no known allergies.  Home Medications   Prior to Admission medications   Medication Sig Start Date End Date Taking? Authorizing Provider  amiodarone (PACERONE) 400 MG tablet Take 1 tablet (400 mg total) by mouth daily. 12/15/15  Yes Peter M Swaziland, MD  calcium acetate (PHOSLO) 667 MG capsule Take 667 mg by mouth 3 (three) times daily with meals.   Yes Historical Provider, MD  HYDROcodone-acetaminophen (NORCO/VICODIN) 5-325 MG tablet Take 1 tablet by mouth  every 6 (six) hours as needed for moderate pain. 02/26/16  Yes Nishant Dhungel, MD  lidocaine (XYLOCAINE) 2 % jelly Place 1 application into the urethra as needed (for pain).    Yes Historical Provider, MD  metoprolol tartrate (LOPRESSOR) 25 MG tablet Take 1 tablet (25 mg total) by mouth 2 (two) times daily. 11/13/15  Yes [name], DO  oxyCODONE (ROXICODONE) 5 MG/5ML solution Take 5 mg by mouth daily as needed for breakthrough pain.  05/22/16  Yes Historical Provider, MD  oxyCODONE-acetaminophen (PERCOCET/ROXICET)  5-325 MG tablet Take 2 tablets by mouth every 4 (four) hours as needed for severe pain. 05/28/16  Yes Rodolfo Notaro Tripp Chalese Peach, PA-C  predniSONE (DELTASONE) 10 MG tablet Take 10 mg by mouth daily.   Yes Historical Provider, MD  calcitRIOL (ROCALTROL) 0.25 MCG capsule Take 1.25 mcg by mouth 2 (two) times daily. 05/22/16   Historical Provider, MD  calcium carbonate (TUMS - DOSED IN MG ELEMENTAL CALCIUM) 500 MG chewable tablet Chew 4 tablets by mouth 3 (three) times daily with meals.    Historical Provider, MD  warfarin (COUMADIN) 2 MG tablet Take 1 tablet by mouth daily or as directed by coumadin clinic Patient taking differently: Take 1-2 mg by mouth daily at 6 PM. Takes 1/2 tab on sun only Takes 1 tab all other days 03/28/16   Peter M Swaziland, MD   BP 115/71 mmHg  Pulse 73  Temp(Src) 98.6 F (37 C) (Oral)  Resp 20  SpO2 94% Physical Exam  Constitutional: He is oriented to person, place, and time. No distress.  Ill-appearing male  HENT:  Head: Normocephalic and atraumatic.  Mouth/Throat: No oropharyngeal exudate.  Dry mucous membranes  Eyes: Conjunctivae and EOM are normal. Pupils are equal, round, and reactive to light. Right eye exhibits no discharge. Left eye exhibits no discharge. No scleral icterus.  Cardiovascular: Normal rate, regular rhythm, normal heart sounds and intact distal pulses.  Exam reveals no gallop and no friction rub.   No murmur heard. Pulmonary/Chest: Effort normal and breath sounds normal. No respiratory distress. He has no wheezes. He has no rales. He exhibits no tenderness.  Abdominal: Soft. He exhibits no distension. There is tenderness ( Right lower quadrant tenderness). There is no guarding.  Musculoskeletal: Normal range of motion. He exhibits no edema.  Neurological: He is alert and oriented to person, place, and time. No cranial nerve deficit. He exhibits normal muscle tone.  Hyperreflexia. No dysmetria. Strength 5 out of 5 throughout. No sensory deficits.  Skin:  Skin is warm and dry. No rash noted. He is not diaphoretic. No erythema. No pallor.  Psychiatric: He has a normal mood and affect. His behavior is normal.  Nursing note and vitals reviewed.   ED Course  Procedures (including critical care time) Labs Review Labs Reviewed  CBC WITH DIFFERENTIAL/PLATELET - Abnormal; Notable for the following:    WBC 12.2 (*)    Hemoglobin 12.7 (*)    RDW 19.9 (*)    Monocytes Absolute 1.4 (*)    Eosinophils Absolute 0.9 (*)    All other components within normal limits  BASIC METABOLIC PANEL - Abnormal; Notable for the following:    Sodium 134 (*)    Potassium 6.0 (*)    Chloride 95 (*)    CO2 21 (*)    BUN 35 (*)    Creatinine, Ser 13.34 (*)    Calcium 5.2 (*)    GFR calc non Af Amer 4 (*)    GFR calc Af  Amer 4 (*)    Anion gap 18 (*)    All other components within normal limits  PHOSPHORUS - Abnormal; Notable for the following:    Phosphorus 5.7 (*)    All other components within normal limits  MAGNESIUM  LIPASE, BLOOD  PROTIME-INR  HEPATIC FUNCTION PANEL    Imaging Review Dg Chest 2 View  05/31/2016  CLINICAL DATA:  Cough since thyroid surgery on 05/18/2016. EXAM: CHEST  2 VIEW COMPARISON:  11/12/2015. FINDINGS: Enlarged cardiac silhouette with improvement. Clear lungs. The pulmonary vasculature and interstitial markings remain mildly prominent. Cervical spine fixation hardware. Lower neck surgical clips and surgical clip overlying the thoracic inlet on the right. IMPRESSION: 1. No acute abnormality. 2. Improved cardiomegaly. 3. Stable mild chronic pulmonary vascular congestion and mild chronic interstitial lung disease. Electronically Signed   By: Beckie SaltsSteven  Reid M.D.   On: 05/31/2016 15:25   I have personally reviewed and evaluated these images and lab results as part of my medical decision-making.   EKG Interpretation   Date/Time:  Tuesday May 31 2016 11:58:34 EDT Ventricular Rate:  74 PR Interval:    QRS Duration: 172 QT Interval:   536 QTC Calculation: 595 R Axis:   -69 Text Interpretation:  Sinus rhythm Prolonged PR interval Probable left  atrial enlargement LVH with IVCD, LAD and secondary repol abnrm Prolonged  QT interval Baseline wander in lead(s) V3 V4 V5 No significant change  since last tracing Confirmed by KNAPP  MD-J, JON ([id number]) on 05/31/2016  1:20:33 PM Also confirmed by KNAPP  MD-J, JON ([id number]), editor Dan HumphreysWALKER, CCT,  SANDRA ([id number])  on 05/31/2016 1:29:58 PM      MDM   Final diagnoses:  Hypocalcemia  Penile ulcer  ESRD (end stage renal disease) (HCC)    55 year old male with history of ESRD, hypocalcemia status post prior thyroidectomy presents the ED with ongoing weakness. Patient has been unable to tolerate his home calcium supplements as they are making him vomit. He was scheduled to go to dialysis today but was unable to make it due to his weakness. Presentation to ED patient appears fatigued and frail. All vital signs are stable. Corrected calcium found to be 6. Creatinine elevated to 13 and potassium elevated to 6. He was given IV calcium gluconate in the ED. Given severe abnormal lab values feel the patient would benefit from admission with continued IV calcium and dialysis. Spoke to family medicine service, attending is Dr.Mcdiarmid who will admit patient. Also spoke with Dr. Marisue HumbleSanford with nephrology who consult patient in the ED and will arrange for dialysis later today. Patient is currently hemodynamically stable and awaiting bed.  Patient was discussed with and seen by Dr. Lynelle DoctorKnapp who agrees with the treatment plan.      Lester KinsmanSamantha Tripp [place] Lake[place], PA-C 05/31/16 1741  Linwood DibblesJon Knapp, MD 06/01/16 [phone]

## 2016-05-31 NOTE — H&P (Signed)
[facility] Admission History and Physical Service Pager: [phone]  Patient name: Samuel Rowland Medical record number: [id number] Date of birth: 04/06/1961 Age: 55 y.o. Gender: male  Primary Care Provider: Jenny Reichmann, MD Consultants: , Nephrology, [facility], Code Status: Full   Chief Complaint: Generalized weakness and fatigue  Assessment and Plan: Samuel Rowland is a 55 y.o. male with a past medical history significant for ESRD on dialysis, paroxysmal A.fib, hypertension who presented to ED for generalized weakness and hypocalcemia s/p parathyroidectomy.  #Hypocalcemia, acute Patient had a history of hypercalcemia s/p renal transplant in 2010, with scrotal calcium depositions in the past. Because of multiple hospitalizations for hypercalcemia and difficulty maintaining calcium level within normal limit, patient underwent a parathyroidectomy on 6/28 with removal of 3 and 1/2 glands. Patient was started on calcium supplementation, but has not been able to tolerate it with multiple episodes of emesis after taking pills. Patient has also been very nauseated. Hypocalcemia most likely 2/2 parathyroidectomy and inability to take medications. --Nephrology consulted for HD, appreciate assistance. Anticipate improvement with HD --calcium gluconate 1g IV given in ED --Follow up with am BMP --Hold calcitriol capsule --Tums 852m between meals and qHS per nephrology  #Hyperkalemia, acute  Potassium on admission was 6.0, most likely secondary to missed HD session on 07/11. No EKG changes noted, patient in NSR. --Emergent HD --Check am BMP --Morning EKG  #Generalized Weakness, subacute Patient feels very weak and tired since discharge from hospital after parathyroidectomy. Weakness is 2/2 to decrease po intake from multiple emesis episodes in the setting of calcium supplementation intolerance. Patient has also missed today's HD session which most likely  exacerbated his condition. --Emergent HD --Start patient on IVF D5 1/2 normal 75 cc/hr (ESRD)  #ESRD Patient with a history of ESRD with transplant in 2010 and subsequent rejection. Patient has missed one session today.  --Consult nephrology for HD --Renal Function Panel   #Penile lesion, acute Patient complains of severe groin pain secondary to a 1x1 cm on the distal tip of the penis. Patient rate the pain 7-8/10 and has been taking percocet for it with minimal relief. 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 There is no signs of drainage, purulence or foul smell. Patient could at risk for an STD but very unlikely. --Urology Consult --Morphine [id number] prn --Oxycodone-acetamipnophen5-325 q4 PRN --Follow up on recs --Could consider STD workup  #Paroxysmal Atrial Fibrillation.  Chadsvasc1. Will continue current home regimen --Warfarin per pharmacy --Amiodarone 40044m-Check INR --On telemetry  #Hypertension Well controlled with home regimen --Metoprolol 25 mg  #Renal transplant, rejection --Continue prednisone 80m104m FEN/GI: Renal diet,  Prophylaxis: Warfarin  Disposition: Admit to FMTS  History of Present Illness:  Samuel Rowland 55 y110. male with a past medical history significant for, renal transplant in 2010 s/p rejection, ESRD on dialysis, paroxysmal A.fib (PAF), chronic systolic CHF (EF 30-335-32%pertension who presented to the ED with generalized weakness and fatigue. Patient recently underwent a [facility] on 6/[facility] chronic hypercalcemia. Patient was discharged on 7/2, and started on calcium supplementation. However patient has been unable to tolerate pills and have been reported multiple episodes of emesis accompanied with some nausea. Patient also endorse very little food or fluid intake since discharge from his surgery. Patient was seen on Saturday (7/8) in the ED for similar symptoms  and was given calcium. Since discharge from the hospital on  7/8 patient continue to vomit and is unable to keep pills down and has had very little food and hydration. Patient missed his scheduled dialysis session today 2/2 to profound weakness and fatigue. Patient also complains of severe groin pain from sore at the tip of his penis. Patient rate the pain as severe (7/10). Patient denies any burning with urination, fever or chills.  In the ED, patient corrected calcium was 6.0, a serum creatinine of 13.34 and a potassium of 6.0. Patient was given calcium gluconate 1g, CXR showed some vascular congestion and some interstitial lung disease and EKG with normal sinus rhythm.  Review Of Systems: Per HPI otherwise the remainder of the systems were negative.  Patient Active Problem List   Diagnosis Date Noted  . Scrotal abscess 02/26/2016  . Sebaceous cyst of scrotum 02/26/2016  . Cellulitis, scrotum 02/22/2016  . Abnormal nuclear stress test 12/15/2015  . ESRD (end stage renal disease) ([place]) 12/15/2015  . Other hypertrophic cardiomyopathy ([place]) 11/26/2015  . Atrial fibrillation ([place]) [I48.91] 11/17/2015  . Long term (current) use of anticoagulants [Z79.01] 11/17/2015  . Paroxysmal atrial fibrillation with RVR ([place]) 11/12/2015  . Anemia of chronic kidney failure 11/12/2015  . 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 Acute kidney injury ([place])   . Essential hypertension   . Metabolic acidosis 68/34/1962  . Protein-calorie malnutrition, severe ([place]) 09/20/2014  . AKI (acute kidney injury) ([place]) 09/20/2014  . Renal transplant recipient 09/20/2014  . Prostatitis, acute 09/19/2014    Past Medical History: Past Medical History  Diagnosis Date  . Kidney transplant as cause of abnormal reaction or later complication   . Hypertension   . Nausea and vomiting     chronic  . Anemia   . Atrial fibrillation ([place])   . Heart murmur   . Paroxysmal atrial  fibrillation with RVR ([place])   . GERD (gastroesophageal reflux disease)   . Renal disorder     S/P right transplant 2010  . ESRD (end stage renal disease) on dialysis [facility])     "[facility]; TTS since ~10/2014" (11/12/2015)    Past Surgical History: Past Surgical History  Procedure Laterality Date  . Kidney transplant Right 09/2009    "[facility]"  . Anterior cervical decomp/discectomy fusion  01/1999    C5-6   . Cardioversion    . Back surgery    . Colonoscopy w/ polypectomy  10/2009  . Esophagogastroduodenoscopy  10/2009  . Renal biopsy  2015  . Cardiac catheterization N/A 12/23/2015    Procedure: Left Heart Cath and Coronary Angiography;  Surgeon: Peter M Martinique, MD;  Location: [place] CV LAB;  Service: Cardiovascular;  Laterality: N/A;    Social History: Social History  Substance Use Topics  . Smoking status: Former Smoker -- 22 years    Types: Cigars  . Smokeless tobacco: Never Used     Comment: "stopped smoking in 1998"  . Alcohol Use: No   Additional social history: none Please also refer to relevant sections of EMR.  Family History: Family History  Problem Relation Age of Onset  . Hypertension Father    Allergies and Medications: No Known Allergies No current facility-administered medications on file prior to encounter.   Current Outpatient Prescriptions on File Prior to Encounter  Medication Sig Dispense Refill  . amiodarone (PACERONE) 400 MG tablet Take 1 tablet (400 mg total) by mouth daily. 30 tablet 11  . calcium acetate (PHOSLO) 667 MG capsule Take 667  mg by mouth 3 (three) times daily with meals.    Marland Kitchen HYDROcodone-acetaminophen (NORCO/VICODIN) 5-325 MG tablet Take 1 tablet by mouth every 6 (six) hours as needed for moderate pain. 20 tablet 0  . lidocaine (XYLOCAINE) 2 % jelly Place 1 application into the urethra as needed (for pain).     . metoprolol tartrate (LOPRESSOR) 25 MG tablet Take 1 tablet (25 mg total) by mouth 2 (two) times daily.  60 tablet 0  . oxyCODONE (ROXICODONE) 5 MG/5ML solution Take 5 mg by mouth daily as needed for breakthrough pain.     Marland Kitchen oxyCODONE-acetaminophen (PERCOCET/ROXICET) 5-325 MG tablet Take 2 tablets by mouth every 4 (four) hours as needed for severe pain. 15 tablet 0  . predniSONE (DELTASONE) 10 MG tablet Take 10 mg by mouth daily.    . calcitRIOL (ROCALTROL) 0.25 MCG capsule Take 1.25 mcg by mouth 2 (two) times daily.    . calcium carbonate (TUMS - DOSED IN MG ELEMENTAL CALCIUM) 500 MG chewable tablet Chew 4 tablets by mouth 3 (three) times daily with meals.    . warfarin (COUMADIN) 2 MG tablet Take 1 tablet by mouth daily or as directed by coumadin clinic (Patient taking differently: Take 1-2 mg by mouth daily at 6 PM. Takes 1/2 tab on sun only Takes 1 tab all other days) 30 tablet 1    Objective: BP 116/82 mmHg  Pulse 86  Temp(Src) 98.6 F (37 C) (Oral)  Resp 22  SpO2 87% Exam: General: Patient appears fatigued, but able to answer questions during exam HEENT: PEERLA, dry mucous membrane Neck: supple, no JVD Cardiovascular: RRR, refer murmur from fistula, no gallops, rubs. Weak pulses Respiratory: Moving air clearly bilaterally, no increase work of breathing,no wheezes,no  crackles Abdomen: RLQ pain upon palpation, no distended, normal BS MSK: Range of motion intact, upper and lower extremities Skin: no rashes, warm and well perfused Neuro: CN2-12 intact. Normal strength upper and lower extremity, sensation intact bilaterally. 3-4 beats of clonus in feet bilaterally. Negative chvostek and Trousseau's signs  Psych: AOx4, normal mood and affect GU: Lesion noted on the distal end of the penis close to meatus, no drainage noted.  Labs and Imaging:        Results for orders placed or performed during the hospital encounter of 05/31/16 (from the past 24 hour(s))  CBC with Differential     Status: Abnormal   Collection Time: 05/31/16 10:24 AM  Result Value Ref Range   WBC 12.2 (H) 4.0 -  10.5 K/uL   RBC 4.61 4.22 - 5.81 MIL/uL   Hemoglobin 12.7 (L) 13.0 - 17.0 g/dL   HCT 40.6 39.0 - 52.0 %   MCV 88.1 78.0 - 100.0 fL   MCH 27.5 26.0 - 34.0 pg   MCHC 31.3 30.0 - 36.0 g/dL   RDW 19.9 (H) 11.5 - 15.5 %   Platelets 312 150 - 400 K/uL   Neutrophils Relative % 59 %   Neutro Abs 7.3 1.7 - 7.7 K/uL   Lymphocytes Relative 21 %   Lymphs Abs 2.6 0.7 - 4.0 K/uL   Monocytes Relative 12 %   Monocytes Absolute 1.4 (H) 0.1 - 1.0 K/uL   Eosinophils Relative 8 %   Eosinophils Absolute 0.9 (H) 0.0 - 0.7 K/uL   Basophils Relative 0 %   Basophils Absolute 0.1 0.0 - 0.1 K/uL  Basic metabolic panel     Status: Abnormal   Collection Time: 05/31/16 10:24 AM  Result Value Ref Range   Sodium  134 (L) 135 - 145 mmol/L   Potassium 6.0 (H) 3.5 - 5.1 mmol/L   Chloride 95 (L) 101 - 111 mmol/L   CO2 21 (L) 22 - 32 mmol/L   Glucose, Bld 74 65 - 99 mg/dL   BUN 35 (H) 6 - 20 mg/dL   Creatinine, Ser 13.34 (H) 0.61 - 1.24 mg/dL   Calcium 5.2 (LL) 8.9 - 10.3 mg/dL   GFR calc non Af Amer 4 (L) >60 mL/min   GFR calc Af Amer 4 (L) >60 mL/min   Anion gap 18 (H) 5 - 15  Magnesium     Status: None   Collection Time: 05/31/16 10:24 AM  Result Value Ref Range   Magnesium 1.8 1.7 - 2.4 mg/dL  Phosphorus     Status: Abnormal   Collection Time: 05/31/16 10:24 AM  Result Value Ref Range   Phosphorus 5.7 (H) 2.5 - 4.6 mg/dL  Lipase, blood     Status: None   Collection Time: 05/31/16 10:24 AM  Result Value Ref Range   Lipase 21 11 - 51 U/L  Protime-INR     Status: Abnormal   Collection Time: 05/31/16 10:24 AM  Result Value Ref Range   Prothrombin Time 20.6 (H) 11.6 - 15.2 seconds   INR 1.77 (H) 0.00 - 1.49  Hepatic function panel     Status: Abnormal   Collection Time: 05/31/16 10:24 AM  Result Value Ref Range   Total Protein 8.5 (H) 6.5 - 8.1 g/dL   Albumin 3.0 (L) 3.5 - 5.0 g/dL   AST 25 15 - 41 U/L   ALT 22 17 - 63 U/L   Alkaline Phosphatase 448 (H) 38 - 126 U/L   Total Bilirubin 0.9 0.3 - 1.2  mg/dL   Bilirubin, Direct 0.3 0.1 - 0.5 mg/dL   Indirect Bilirubin 0.6 0.3 - 0.9 mg/dL   INR 1.77  EKG: NSR, no peaked T waves  Imaging Chest X ray 1. No acute abnormality. 2. Improved cardiomegaly. 3. Stable mild chronic pulmonary vascular congestion and mild chronic interstitial lung disease  Marjie Skiff, MD 05/31/2016, 4:49 PM PGY-1, [place] Intern pager: [phone], text pages welcome  UPPER LEVEL ADDENDUM  I have read the above note and made revisions highlighted in blue.  Algis Greenhouse. Jerline Pain, [place] Resident PGY-3 06/01/2016 1:00 AM

## 2016-05-31 NOTE — Progress Notes (Signed)
CBG- [id number] 

## 2016-05-31 NOTE — Progress Notes (Signed)
cbg 95 

## 2016-05-31 NOTE — ED Notes (Signed)
MD at bedside. 

## 2016-05-31 NOTE — Consult Note (Signed)
Samuel Rowland Admit Date: 05/31/2016 05/31/2016 Samuel Rowland Requesting Physician:  MacDiarmid  Reason for Consult:  Hypcalcemia, Weakness, Hyperkalemia, ESRD HPI:  55 year old male presented to the emergency room today with weakness. Asked pertinent history includes ESRD on hemodialysis every THS with the [facility] group, significant hyperparathyroidism status post parathyroidectomy 05/18/16 (3.5 glands removed) discharged on Tums, PhosLo, high-dose active vitamin D; systolic congestive heart failure, atrial fibrillation. He has been struggling with weakness ever since his discharge. He has been intolerant of oral calcitriol which causes nausea and vomiting immediately. He has made attempts to take his high-dose Tums. He tells me that he was placed on a high calcium bath at his dialysis unit. He felt too weak to attend dialysis today and was brought to the emergency room. Labs are notable for a calcium of 5.2 with an albumin of 3, potassium of 6, BUN 35, creatinine of 13. In the emergency room he was treated with calcium gluconate, 1 g. He has been admitted to the teaching service.  Primary kidney disease was nonsteroidals and hypertension. He had a deceased donor kidney transplant between 2010 and 2015. He uses a left upper extremity AV fistula.  HD unit is [facility].     CREATININE, SER (mg/dL)  Date Value  16/08/9603 13.34*  05/28/2016 4.76*  02/24/2016 8.25*  02/23/2016 13.45*  02/22/2016 10.85*  11/13/2015 9.43*  11/12/2015 8.00*  11/12/2015 8.11*  09/21/2014 4.07*  09/20/2014 3.92*  ] I/Os:  ROS Balance of 12 systems is negative w/ exceptions as above  PMH  Past Medical History  Diagnosis Date  . Kidney transplant as cause of abnormal reaction or later complication   . Hypertension   . Nausea and vomiting     chronic  . Anemia   . Atrial fibrillation (HCC)   . Heart murmur   . Paroxysmal atrial fibrillation with RVR (HCC)   . GERD (gastroesophageal reflux disease)   .  Renal disorder     S/P right transplant 2010  . ESRD (end stage renal disease) on dialysis [facility])     "[facility]; TTS since ~10/2014" (11/12/2015)   PSH  Past Surgical History  Procedure Laterality Date  . Kidney transplant Right 09/2009    "[facility]"  . Anterior cervical decomp/discectomy fusion  01/1999    C5-6   . Cardioversion    . Back surgery    . Colonoscopy w/ polypectomy  10/2009  . Esophagogastroduodenoscopy  10/2009  . Renal biopsy  2015  . Cardiac catheterization N/A 12/23/2015    Procedure: Left Heart Cath and Coronary Angiography;  Surgeon: Peter M Swaziland, MD;  Location: [facility] INVASIVE CV LAB;  Service: Cardiovascular;  Laterality: N/A;   FH  Family History  Problem Relation Age of Onset  . Hypertension Father    SH  reports that he has quit smoking. His smoking use included Cigars. He has never used smokeless tobacco. He reports that he does not drink alcohol or use illicit drugs. Allergies No Known Allergies Home medications Prior to Admission medications   Medication Sig Start Date End Date Taking? Authorizing Provider  amiodarone (PACERONE) 400 MG tablet Take 1 tablet (400 mg total) by mouth daily. 12/15/15  Yes Peter M Swaziland, MD  calcium acetate (PHOSLO) 667 MG capsule Take 667 mg by mouth 3 (three) times daily with meals.   Yes Historical Provider, MD  HYDROcodone-acetaminophen (NORCO/VICODIN) 5-325 MG tablet Take 1 tablet by mouth every 6 (six) hours as needed for moderate pain. 02/26/16  Yes Nishant Dhungel, MD  lidocaine (XYLOCAINE) 2 % jelly Place 1 application into the urethra as needed (for pain).    Yes Historical Provider, MD  metoprolol tartrate (LOPRESSOR) 25 MG tablet Take 1 tablet (25 mg total) by mouth 2 (two) times daily. 11/13/15  Yes Gwynn BurlyAndrew Wallace, DO  oxyCODONE (ROXICODONE) 5 MG/5ML solution Take 5 mg by mouth daily as needed for breakthrough pain.  05/22/16  Yes Historical Provider, MD  oxyCODONE-acetaminophen (PERCOCET/ROXICET) 5-325 MG  tablet Take 2 tablets by mouth every 4 (four) hours as needed for severe pain. 05/28/16  Yes Samantha Tripp Dowless, PA-C  predniSONE (DELTASONE) 10 MG tablet Take 10 mg by mouth daily.   Yes Historical Provider, MD  calcitRIOL (ROCALTROL) 0.25 MCG capsule Take 1.25 mcg by mouth 2 (two) times daily. 05/22/16   Historical Provider, MD  calcium carbonate (TUMS - DOSED IN MG ELEMENTAL CALCIUM) 500 MG chewable tablet Chew 4 tablets by mouth 3 (three) times daily with meals.    Historical Provider, MD  warfarin (COUMADIN) 2 MG tablet Take 1 tablet by mouth daily or as directed by coumadin clinic Patient taking differently: Take 1-2 mg by mouth daily at 6 PM. Takes 1/2 tab on sun only Takes 1 tab all other days 03/28/16   Peter M SwazilandJordan, MD    Current Medications Scheduled Meds: . calcium carbonate  800 mg of elemental calcium Oral TID  . doxercalciferol  6 mcg Intravenous Daily   Continuous Infusions: . calcium gluconate     PRN Meds:.promethazine  CBC  Recent Labs Lab 05/28/16 1135 05/31/16 1024  WBC 9.9 12.2*  NEUTROABS  --  7.3  HGB 12.7* 12.7*  HCT 41.2 40.6  MCV 88.8 88.1  PLT 247 312   Basic Metabolic Panel  Recent Labs Lab 05/28/16 1135 05/31/16 1024  NA 135 134*  K 4.9 6.0*  CL 97* 95*  CO2 24 21*  GLUCOSE 107* 74  BUN 9 35*  CREATININE 4.76* 13.34*  CALCIUM 7.7* 5.2*  PHOS  --  5.7*    Physical Exam  Blood pressure 147/91, pulse 78, temperature 98.6 F (37 C), temperature source Oral, resp. rate 22, SpO2 93 %. GEN: NAD, appears unwell ENT: NCAT EYES: EOMI CV: RRR, nl s1s2 PULM: CTAB ABD: s/nt, +bs SKIN: no rashes/lesions EXT:No Edema  LUE AVF +B/T   Assessment 80M ESRD presenting with weakness in context of severe hypocalcemia after 3.5gland PTHectomy 05/18/16 at [facility], mild hyperkalemia.    1. 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 2HPTH S/p PTHectomy 05/18/16 3. ESRD THS [facility] LUE AVF 4. Hyperkalemia,  mild 5. CHF 6. AFib 7. HTN 8. Hx/o scrotal abscess / chronic wound  Plan 1. HD now, high 3.5 Ca bath, 2K, Use AVF, no heparin 2. Cont Tums 800mg  Elemental IN BETWEEN MEALS and qHS 3. Change to IV VDRA -- hectorol 6mcg qday 4. 1gm Ca Gluc now 5. TID BMP   Sabra Heckyan Sanford MD [phone] pgr 05/31/2016, 4:39 PM

## 2016-05-31 NOTE — ED Notes (Signed)
CBG is 57.

## 2016-05-31 NOTE — ED Notes (Signed)
Patient here with ongoing weakness and nausea for several days, seen in ED Saturday for same and states feeling no better, was to have dialysis this am but states felt to bad. Complains of groin pain

## 2016-05-31 NOTE — Care Management Note (Signed)
Case Management Note  Patient Details  Name: Samuel Rowland MRN: [id number] Date of Birth: 10/09/1961  Subjective/Objective:                  55 year old male with a past medical history of ESRD on hemodialysis status post kidney transplant, HTN, paroxysmal atrial fibrillation, hypocalcemia status post parathyroidectomy who presents the ED today complaining of weakness./ From home alone.  Action/Plan: Follow for disposition needs.   Expected Discharge Date:  06/02/16               Expected Discharge Plan:     In-House Referral:     Discharge planning Services     Post Acute Care Choice:    Choice offered to:     DME Arranged:    DME Agency:     HH Arranged:    HH Agency:     Status of Service:     If discussed at [employer] Meetings, dates discussed:    Additional Comments:  Oletta CohnWood, Isys Tietje, RN 05/31/2016, 2:59 PM

## 2016-05-31 NOTE — Progress Notes (Signed)
cbg 93. 

## 2016-05-31 NOTE — Progress Notes (Signed)
ANTICOAGULATION CONSULT NOTE - Initial Consult  Pharmacy Consult for warfarin Indication: atrial fibrillation  No Known Allergies  Patient Measurements: Weight:  (uta- on stretcher)  Vital Signs: Temp: 98.1 F (36.7 C) (07/11 2034) Temp Source: Oral (07/11 2034) BP: 170/96 mmHg (07/11 2059) Pulse Rate: 79 (07/11 2059)  Labs:  Recent Labs  05/31/16 1024 05/31/16 1820  HGB 12.7*  --   HCT 40.6  --   PLT 312  --   LABPROT 20.6*  --   INR 1.77*  --   CREATININE 13.34* 14.03*    CrCl cannot be calculated (Unknown ideal weight.).   Medical History: Past Medical History  Diagnosis Date  . Kidney transplant as cause of abnormal reaction or later complication   . Hypertension   . Nausea and vomiting     chronic  . Anemia   . Atrial fibrillation (HCC)   . Heart murmur   . Paroxysmal atrial fibrillation with RVR (HCC)   . GERD (gastroesophageal reflux disease)   . Renal disorder     S/P right transplant 2010  . ESRD (end stage renal disease) on dialysis [facility])     "[facility]; TTS since ~10/2014" (11/12/2015)    Medications:  Prescriptions prior to admission  Medication Sig Dispense Refill Last Dose  . amiodarone (PACERONE) 400 MG tablet Take 1 tablet (400 mg total) by mouth daily. 30 tablet 11 05/30/2016  . calcium acetate (PHOSLO) 667 MG capsule Take 667 mg by mouth 3 (three) times daily with meals.   05/30/2016 at Unknown time  . HYDROcodone-acetaminophen (NORCO/VICODIN) 5-325 MG tablet Take 1 tablet by mouth every 6 (six) hours as needed for moderate pain. 20 tablet 0 Past Week at Unknown time  . lidocaine (XYLOCAINE) 2 % jelly Place 1 application into the urethra as needed (for pain).    05/29/2016 at Unknown time  . metoprolol tartrate (LOPRESSOR) 25 MG tablet Take 1 tablet (25 mg total) by mouth 2 (two) times daily. 60 tablet 0 05/29/2016 at 8p  . oxyCODONE (ROXICODONE) 5 MG/5ML solution Take 5 mg by mouth daily as needed for breakthrough pain.    05/29/2016 at  Unknown time  . oxyCODONE-acetaminophen (PERCOCET/ROXICET) 5-325 MG tablet Take 2 tablets by mouth every 4 (four) hours as needed for severe pain. 15 tablet 0 Past Week at Unknown time  . predniSONE (DELTASONE) 10 MG tablet Take 10 mg by mouth daily.   05/29/2016 at Unknown time  . calcitRIOL (ROCALTROL) 0.25 MCG capsule Take 1.25 mcg by mouth 2 (two) times daily.   05/29/2016  . calcium carbonate (TUMS - DOSED IN MG ELEMENTAL CALCIUM) 500 MG chewable tablet Chew 4 tablets by mouth 3 (three) times daily with meals.   05/29/2016  . warfarin (COUMADIN) 2 MG tablet Take 1 tablet by mouth daily or as directed by coumadin clinic (Patient taking differently: Take 1-2 mg by mouth daily at 6 PM. Takes 1/2 tab on sun only Takes 1 tab all other days) 30 tablet 1 05/29/2016 at 9p    Assessment: 55 y/o male with ESRD on HD TTS who presented to the ED 05/31/2016 with weakness and nausea. Of note, patient had a parathyroidectomy for chronic hypercalcemia on 6/28. He has been unable to tolerate medication/food due to nausea and emesis since discharge on 7/2. Pharmacy consulted to manage warfarin inpatient.  INR is subtherapeutic at 1.77. No bleeding noted, Hgb stable at 12.7, platelets are normal.  PTA regimen: 2 mg daily except 1 mg on Sun,  last dose at home was 7/9.  Goal of Therapy:  INR 2-3 Monitor platelets by anticoagulation protocol: Yes   Plan:  -Warfarin 2.5 mg PO tonight -INR daily -Monitor oral intake as this affects INR and s/sx of bleeding  Loura BackJennifer Foxhome, PharmD, BCPS Clinical Pharmacist Pager: [phone] 05/31/2016 9:15 PM

## 2016-05-31 NOTE — Progress Notes (Signed)
Pt arrived to HD unit via stretcher in no distress. Treatment initiated at 2034 without issue. Pt does complain of severe pain to groin area. Medicated. Continue to monitor.

## 2016-06-01 LAB — BASIC METABOLIC PANEL
Anion gap: 15 (ref 5–15)
Anion gap: 13 (ref 5–15)
Anion gap: 14 (ref 5–15)
BUN: 17 mg/dL (ref 6–20)
BUN: 21 mg/dL — AB (ref 6–20)
BUN: 23 mg/dL — ABNORMAL HIGH (ref 6–20)
CALCIUM: 7 mg/dL — AB (ref 8.9–10.3)
CHLORIDE: 95 mmol/L — AB (ref 101–111)
CHLORIDE: 95 mmol/L — AB (ref 101–111)
CO2: 23 mmol/L (ref 22–32)
CO2: 25 mmol/L (ref 22–32)
CO2: 22 mmol/L (ref 22–32)
CREATININE: 10 mg/dL — AB (ref 0.61–1.24)
CREATININE: 10.12 mg/dL — AB (ref 0.61–1.24)
Calcium: 6.4 mg/dL — CL (ref 8.9–10.3)
Calcium: 6.4 mg/dL — CL (ref 8.9–10.3)
Chloride: 96 mmol/L — ABNORMAL LOW (ref 101–111)
Creatinine, Ser: 8.84 mg/dL — ABNORMAL HIGH (ref 0.61–1.24)
GFR calc Af Amer: 7 mL/min — ABNORMAL LOW (ref >60)
GFR calc non Af Amer: 5 mL/min — ABNORMAL LOW (ref >60)
GFR calc non Af Amer: 5 mL/min — ABNORMAL LOW (ref >60)
GFR, EST AFRICAN AMERICAN: 6 mL/min — AB (ref >60)
GFR, EST AFRICAN AMERICAN: 6 mL/min — AB (ref >60)
GFR, EST NON AFRICAN AMERICAN: 6 mL/min — AB (ref >60)
GLUCOSE: 64 mg/dL — AB (ref 65–99)
Glucose, Bld: 182 mg/dL — ABNORMAL HIGH (ref 65–99)
Glucose, Bld: 171 mg/dL — ABNORMAL HIGH (ref 65–99)
POTASSIUM: 5.8 mmol/L — AB (ref 3.5–5.1)
POTASSIUM: 6.2 mmol/L — AB (ref 3.5–5.1)
Potassium: 5.5 mmol/L — ABNORMAL HIGH (ref 3.5–5.1)
Sodium: 134 mmol/L — ABNORMAL LOW (ref 135–145)
Sodium: 133 mmol/L — ABNORMAL LOW (ref 135–145)
Sodium: 131 mmol/L — ABNORMAL LOW (ref 135–145)

## 2016-06-01 LAB — RENAL FUNCTION PANEL
ALBUMIN: 3 g/dL — AB (ref 3.5–5.0)
ANION GAP: 16 — AB (ref 5–15)
BUN: 16 mg/dL (ref 6–20)
CALCIUM: 7.2 mg/dL — AB (ref 8.9–10.3)
CO2: 24 mmol/L (ref 22–32)
Chloride: 97 mmol/L — ABNORMAL LOW (ref 101–111)
Creatinine, Ser: 8.44 mg/dL — ABNORMAL HIGH (ref 0.61–1.24)
GFR calc non Af Amer: 6 mL/min — ABNORMAL LOW (ref >60)
GFR, EST AFRICAN AMERICAN: 7 mL/min — AB (ref >60)
GLUCOSE: 69 mg/dL (ref 65–99)
PHOSPHORUS: 4.3 mg/dL (ref 2.5–4.6)
POTASSIUM: 5 mmol/L (ref 3.5–5.1)
Sodium: 137 mmol/L (ref 135–145)

## 2016-06-01 LAB — CBC
HEMATOCRIT: 46.1 % (ref 39.0–52.0)
HEMOGLOBIN: 14.3 g/dL (ref 13.0–17.0)
MCH: 27.2 pg (ref 26.0–34.0)
MCHC: 31 g/dL (ref 30.0–36.0)
MCV: 87.8 fL (ref 78.0–100.0)
Platelets: 365 10*3/uL (ref 150–400)
RBC: 5.25 MIL/uL (ref 4.22–5.81)
RDW: 19.6 % — ABNORMAL HIGH (ref 11.5–15.5)
WBC: 12.2 10*3/uL — AB (ref 4.0–10.5)

## 2016-06-01 LAB — MRSA PCR SCREENING: MRSA BY PCR: POSITIVE — AB

## 2016-06-01 LAB — PROTIME-INR
INR: 1.85 — ABNORMAL HIGH (ref 0.00–1.49)
Prothrombin Time: 21.3 seconds — ABNORMAL HIGH (ref 11.6–15.2)

## 2016-06-01 MED ORDER — LIDOCAINE HCL 2 % EX GEL
1.0000 "application " | CUTANEOUS | Status: DC | PRN
  Administered 2016-06-01: 1 via URETHRAL
  Filled 2016-06-01 (×3): qty 5

## 2016-06-01 MED ORDER — PREDNISONE 10 MG PO TABS
10.0000 mg | ORAL_TABLET | Freq: Every day | ORAL | Status: DC
  Administered 2016-06-01 – 2016-06-04 (×4): 10 mg via ORAL
  Filled 2016-06-01 (×4): qty 1

## 2016-06-01 MED ORDER — SODIUM CHLORIDE 0.9 % IV SOLN
1.0000 g | Freq: Once | INTRAVENOUS | Status: AC
  Administered 2016-06-01: 1 g via INTRAVENOUS
  Filled 2016-06-01: qty 10

## 2016-06-01 MED ORDER — DOCUSATE SODIUM 100 MG PO CAPS
200.0000 mg | ORAL_CAPSULE | Freq: Every day | ORAL | Status: DC
  Administered 2016-06-01 – 2016-06-04 (×4): 200 mg via ORAL
  Filled 2016-06-01 (×4): qty 2

## 2016-06-01 MED ORDER — CALCIUM CARBONATE ANTACID 500 MG PO CHEW
4.0000 | CHEWABLE_TABLET | Freq: Three times a day (TID) | ORAL | Status: DC
  Administered 2016-06-01: 800 mg via ORAL
  Filled 2016-06-01 (×2): qty 4

## 2016-06-01 MED ORDER — METOPROLOL TARTRATE 25 MG PO TABS
25.0000 mg | ORAL_TABLET | Freq: Two times a day (BID) | ORAL | Status: DC
  Administered 2016-06-01 – 2016-06-04 (×5): 25 mg via ORAL
  Filled 2016-06-01 (×7): qty 1

## 2016-06-01 MED ORDER — AMIODARONE HCL 200 MG PO TABS
400.0000 mg | ORAL_TABLET | Freq: Every day | ORAL | Status: DC
  Administered 2016-06-01 – 2016-06-04 (×4): 400 mg via ORAL
  Filled 2016-06-01 (×4): qty 2

## 2016-06-01 MED ORDER — SODIUM CHLORIDE 0.9% FLUSH
3.0000 mL | Freq: Two times a day (BID) | INTRAVENOUS | Status: DC
  Administered 2016-06-01 – 2016-06-04 (×3): 3 mL via INTRAVENOUS

## 2016-06-01 MED ORDER — CALCITRIOL 0.25 MCG PO CAPS: 1.2500 ug | ORAL_CAPSULE | Freq: Two times a day (BID) | ORAL | Status: DC

## 2016-06-01 MED ORDER — CALCIUM CARBONATE ANTACID 500 MG PO CHEW
800.0000 mg | CHEWABLE_TABLET | Freq: Three times a day (TID) | ORAL | Status: DC
  Administered 2016-06-01 – 2016-06-04 (×9): 800 mg via ORAL
  Filled 2016-06-01 (×7): qty 4

## 2016-06-01 MED ORDER — SODIUM CHLORIDE 0.9% FLUSH
3.0000 mL | Freq: Two times a day (BID) | INTRAVENOUS | Status: DC
  Administered 2016-06-02: 3 mL via INTRAVENOUS

## 2016-06-01 MED ORDER — SODIUM CHLORIDE 0.9% FLUSH: 3.0000 mL | INTRAVENOUS | Status: DC | PRN

## 2016-06-01 MED ORDER — MUPIROCIN 2 % EX OINT
1.0000 "application " | TOPICAL_OINTMENT | Freq: Two times a day (BID) | CUTANEOUS | Status: DC
  Administered 2016-06-01 – 2016-06-04 (×7): 1 via NASAL
  Filled 2016-06-01 (×3): qty 22

## 2016-06-01 MED ORDER — WARFARIN SODIUM 2.5 MG PO TABS
2.5000 mg | ORAL_TABLET | Freq: Once | ORAL | Status: AC
  Administered 2016-06-01: 2.5 mg via ORAL
  Filled 2016-06-01: qty 1

## 2016-06-01 MED ORDER — CHLORHEXIDINE GLUCONATE CLOTH 2 % EX PADS
6.0000 | MEDICATED_PAD | Freq: Every day | CUTANEOUS | Status: DC
  Administered 2016-06-01 – 2016-06-04 (×3): 6 via TOPICAL

## 2016-06-01 MED ORDER — SODIUM CHLORIDE 0.9 % IV SOLN: 250.0000 mL | INTRAVENOUS | Status: DC | PRN

## 2016-06-01 MED ORDER — CALCIUM ACETATE (PHOS BINDER) 667 MG PO CAPS
667.0000 mg | ORAL_CAPSULE | Freq: Three times a day (TID) | ORAL | Status: DC
Start: 2016-06-01 — End: 2016-06-04
  Administered 2016-06-01 – 2016-06-04 (×10): 667 mg via ORAL
  Filled 2016-06-01 (×10): qty 1

## 2016-06-01 NOTE — Progress Notes (Signed)
Admit: 05/31/2016 LOS: 1  74M ESRD presenting with weakness in context of severe hypocalcemia after 3.5gland PTHectomy 05/18/16 at [facility], mild hyperkalemia.  Subjective:  HD yesterday: post weight not measured, 2L UF; tolerated well Feels somewhat stronger this AM Ca up significantly     07/11 0701 - 07/12 0700 In: 120 [P.O.:120] Out: 2000   Filed Weights   06/01/16 0111  Weight: 76.25 kg (168 lb 1.6 oz)    Scheduled Meds: . amiodarone  400 mg Oral Daily  . calcitRIOL  1.25 mcg Oral BID  . calcium acetate  667 mg Oral TID WC  . calcium carbonate  4 tablet Oral TID WC  . calcium carbonate  800 mg of elemental calcium Oral QHS  . Chlorhexidine Gluconate Cloth  6 each Topical Q0600  . doxercalciferol  6 mcg Intravenous Daily  . metoprolol tartrate  25 mg Oral BID  . mupirocin ointment  1 application Nasal BID  . predniSONE  10 mg Oral Daily  . sodium chloride flush  3 mL Intravenous Q12H  . sodium chloride flush  3 mL Intravenous Q12H  . Warfarin - Pharmacist Dosing Inpatient   Does not apply q1800   Continuous Infusions: . dextrose 5 % and 0.45% NaCl 1,000 mL infusion 75 mL/hr at 05/31/16 1956   PRN Meds:.sodium chloride, morphine injection, oxyCODONE-acetaminophen, promethazine, sodium chloride flush  Current Labs: reviewed    Physical Exam:  Blood pressure 83/50, pulse 83, temperature 98.8 F (37.1 C), temperature source Oral, resp. rate 18, height 5\' 10"  (1.778 m), weight 76.25 kg (168 lb 1.6 oz), SpO2 91 %. GEN: NAD, appears unwell ENT: NCAT EYES: EOMI CV: RRR, nl s1s2 PULM: CTAB ABD: s/nt, +bs SKIN: no rashes/lesions EXT:No Edema LUE AVF +B/T   Assessment    1. 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 2HPTH S/p PTHectomy 05/18/16 3. ESRD THS HP [facility] LUE AVF 4. Hyperkalemia, mild 5. CHF 6. AFib 7. HTN 8. Hx/o scrotal abscess / chronic wound  Plan 1. Cont THS HD using 3.5Ca bath.   2. Cont Tums 800mg  Elemental IN BETWEEN  MEALS and qHS 3. Change to IV VDRA -- hectorol 6mcg qday; would be best that HD unit gives IV as well  Sabra Heckyan Quintavia Rogstad MD 06/01/2016, 9:46 AM   Recent Labs Lab 05/31/16 1024 05/31/16 1820 06/01/16 0432 06/01/16 0618  NA 134* 136 137 134*  K 6.0* 6.4* 5.0 5.5*  CL 95* 97* 97* 96*  CO2 21* 16* 24 23  GLUCOSE 74 53* 69 64*  BUN 35* 39* 16 17  CREATININE 13.34* 14.03* 8.44* 8.84*  CALCIUM 5.2* 5.3* 7.2* 7.0*  PHOS 5.7*  --  4.3  --     Recent Labs Lab 05/28/16 1135 05/31/16 1024 06/01/16 0618  WBC 9.9 12.2* 12.2*  NEUTROABS  --  7.3  --   HGB 12.7* 12.7* 14.3  HCT 41.2 40.6 46.1  MCV 88.8 88.1 87.8  PLT 247 312 365

## 2016-06-01 NOTE — Progress Notes (Signed)
CRITICAL VALUE ALERT  Critical value received:  +MRSA PCR  Date of notification:  06/01/16   Time of notification:  0746  Critical value read back:Yes.    Nurse who received alert:  Patsey Berthold. Kamylle Axelson, RN  MD notified (1st page):  Family Medicine Teaching Service   Time of first page:  [phone]  MD notified (2nd page):  Time of second page:  Responding MD:  Dr. Serafina Royalsan Warden  Time MD responded:  [phone]

## 2016-06-01 NOTE — Progress Notes (Signed)
ANTICOAGULATION CONSULT NOTE  Pharmacy Consult for warfarin Indication: atrial fibrillation  No Known Allergies  Patient Measurements: Height: 5\' 10"  (177.8 cm) Weight: 168 lb 1.6 oz (76.25 kg) IBW/kg (Calculated) : 73  Vital Signs: Temp: 98.8 F (37.1 C) (07/12 0424) Temp Source: Oral (07/12 0802) BP: 83/50 mmHg (07/12 0814) Pulse Rate: 83 (07/12 0802)  Labs:  Recent Labs  05/31/16 1024 05/31/16 1820 06/01/16 0432 06/01/16 0618  HGB 12.7*  --   --  14.3  HCT 40.6  --   --  46.1  PLT 312  --   --  365  LABPROT 20.6*  --   --  21.3*  INR 1.77*  --   --  1.85*  CREATININE 13.34* 14.03* 8.44* 8.84*    Estimated Creatinine Clearance: 9.7 mL/min (by C-G formula based on Cr of 8.84).  Assessment: 55 y/o male with ESRD on HD TTS who presented to the ED 05/31/2016 with weakness and nausea. Of note, patient had a [facility]. He has been unable to tolerate medication/food due to nausea and emesis since discharge on 7/2.  PTA regimen: 2 mg daily except 1 mg on Sun, last dose at home was 7/9.  INR today 1.85. Hgb 14.3, plts 365- stable, no bleeding noted.  Goal of Therapy:  INR 2-3 Monitor platelets by anticoagulation protocol: Yes   Plan:  -Warfarin 2.5mg  PO x1 again tonight -Daily INR -Monitor oral intake as this affects INR -Monitor s/sx of bleeding  Daschel Roughton D. Damier Disano, PharmD, BCPS Clinical Pharmacist Pager: [phone] 06/01/2016 1:20 PM

## 2016-06-01 NOTE — Progress Notes (Signed)
Family Medicine Teaching Service Daily Progress Note Intern Pager: [phone]  Patient name: Samuel Rowland Medical record number: [id number] Date of birth: 10/05/1961 Age: 55 y.o. Gender: male  Primary Care Provider: Lucilla EdinAUB, STEVE A, MD Consultants: Nephrology  Code Status: Full   Pt Overview and Major Events to Date:  Emergent HD  Assessment and Plan: Samuel Rowland is a 55 y.o. male with a past medical history significant for ESRD on dialysis, paroxysmal A.fib, hypertension who presented to ED for generalized weakness and hypocalcemia s/p parathyroidectomy.  #Hypocalcemia, resolving Improved with overnight HD, will continue to treat appropriately with IV. Will transition to po prior to discharge. Considering liquid form for calcium supplementation.  --Nephrology consulted, appreciate recs --Follow up  am BMP --Hold calcitriol capsule, while inpatient --Tums 800mg  between meals and qHS per nephrology  #Hyperkalemia, acute  Potassium on admission was 6.0, most likely secondary to missed HD session on 07/11. No EKG changes noted, patient in NSR. This morning K is 5.5. Patient will receive second sessions. --Check am BMP  #Generalized Weakness, subacute Patient feels very weak and tired since discharge from hospital after parathyroidectomy. Weakness is 2/2 to decrease po intake from multiple emesis episodes in the setting of calcium supplementation intolerance. Patient has also missed an HD session which most likely exacerbated his condition. 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 Patient has missed one session today.  --Nephrology following   #Penile lesion, acute Patient complains of severe groin pain secondary to a 1x1 cm on the distal tip of the penis. Patient rate the pain 7-8/10 and has been taking percocet for it with minimal relief. 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 There is no signs of drainage, purulence or foul smell. Patient could at risk for an STD but very unlikely. --Schedule follow up with [facility] post discharge  --D/C morphine 4mg  q4 prn --Will increase-acetamipnophen 10-325 q4 PRN   #Paroxysmal Atrial Fibrillation. Chadsvasc1. Will continue current home regimen --Warfarin per pharmacy --Amiodarone 400mg  --Check INR --On telemetry  #Hypertension Patient slightly hypotensive this am -- Hold Metoprolol 25 mg  #Renal transplant, rejection --Continue prednisone 10mg    FEN/GI: Renal diet with fluid restriction PPx: Warfarin   Disposition: Home after second HD session  Subjective:  Patient feeling better this morning after emergent HD overnight. Weakness has improved, patient is ambulating and eating. Denies emesis, nausea, chest pain, or diarrhea.  Objective: Temp:  [98.1 F (36.7 C)-101 F (38.3 C)] 98.8 F (37.1 C) (07/12 0424) Pulse Rate:  [47-100] 83 (07/12 0802) Resp:  [17-22] 18 (07/12 0802) BP: (77-220)/(50-107) 83/50 mmHg (07/12 0814) SpO2:  [82 %-96 %] 91 % (07/12 0802) Weight:  [168 lb 1.6 oz (76.25 kg)] 168 lb 1.6 oz (76.25 kg) (07/12 0111)   Physical Exam: General: Patient appears fatigued, but able to answer questions during exam  HEENT: PEERLA, dry mucous membrane  Neck: supple, no JVD  Cardiovascular: RRR, refer murmur from fistula, no gallops, rubs. Weak pulses  Respiratory: Moving air clearly bilaterally, no increase work of breathing,no wheezes,no crackles  Abdomen: RLQ pain upon palpation, no distended, normal BS  MSK: Range of motion intact, upper and lower extremities  Skin: no rashes, warm and well perfused  Neuro: CN2-12 intact. Normal strength upper and lower extremity, sensation intact bilaterally. 3-4 beats of clonus in feet bilaterally. Negative chvostek and Trousseau's  signs  Psych: AOx4, normal mood and affect  GU: Lesion noted  on the distal end of the penis close to meatus, no drainage noted.  Laboratory:  Recent Labs Lab 05/28/16 1135 05/31/16 1024 06/01/16 0618  WBC 9.9 12.2* 12.2*  HGB 12.7* 12.7* 14.3  HCT 41.2 40.6 46.1  PLT 247 312 365    Recent Labs Lab 05/28/16 1347 05/31/16 1024 05/31/16 1820 06/01/16 0432 06/01/16 0618  NA  --  134* 136 137 134*  K  --  6.0* 6.4* 5.0 5.5*  CL  --  95* 97* 97* 96*  CO2  --  21* 16* 24 23  BUN  --  35* 39* 16 17  CREATININE  --  13.34* 14.03* 8.44* 8.84*  CALCIUM  --  5.2* 5.3* 7.2* 7.0*  PROT 8.8* 8.5*  --   --   --   BILITOT 0.4 0.9  --   --   --   ALKPHOS 407* 448*  --   --   --   ALT 32 22  --   --   --   AST 31 25  --   --   --   GLUCOSE  --  74 53* 69 64*    Imaging/Diagnostic Tests:  Lovena Neighbours, MD 06/01/2016, 2:59 PM PGY-1, [facility] FPTS Intern pager: [phone], text pages welcome

## 2016-06-01 NOTE — Progress Notes (Signed)
CRITICAL VALUE ALERT  Critical value received:  Calcium 6.4  Date of notification:  06/01/16  Time of notification:  4:35 pm  Critical value read back:Yes.    Nurse who received alert:  Dionisio DavidJay Keagon Glascoe, RN  MD notified (1st page):  Family medicine teaching service  Time of first page:  4;38 pm  MD notified (2nd page):  Time of second page:  Responding MD:  Serafina Royalsan Warden  Time MD responded:  4:40 pm

## 2016-06-01 NOTE — Care Management Note (Signed)
Case Management Note  Patient Details  Name: Samuel Rowland MRN: [id number] Date of Birth: 04/16/1961  Subjective/Objective:      CM following for progression and d/c planning.               Action/Plan: 06/01/2016 Noted CM consult for HH needs, will follow for identification of needs. Unclear at this time and no orders as yet. Will continue to follow.   Expected Discharge Date:  06/02/16               Expected Discharge Plan:  Home w Home Health Services  In-House Referral:     Discharge planning Services     Post Acute Care Choice:    Choice offered to:     DME Arranged:    DME Agency:     HH Arranged:    HH Agency:     Status of Service:  In process, will continue to follow  If discussed at Long Length of Stay Meetings, dates discussed:    Additional Comments:  Starlyn SkeansRoyal, Perley Arthurs U, RN 06/01/2016, 11:56 AM

## 2016-06-02 ENCOUNTER — Telehealth: Payer: Self-pay

## 2016-06-02 LAB — CBC
HEMATOCRIT: 38.8 % — AB (ref 39.0–52.0)
Hemoglobin: 12.3 g/dL — ABNORMAL LOW (ref 13.0–17.0)
MCH: 27.6 pg (ref 26.0–34.0)
MCHC: 31.7 g/dL (ref 30.0–36.0)
MCV: 87 fL (ref 78.0–100.0)
Platelets: 343 10*3/uL (ref 150–400)
RBC: 4.46 MIL/uL (ref 4.22–5.81)
RDW: 19.8 % — AB (ref 11.5–15.5)
WBC: 13.7 10*3/uL — ABNORMAL HIGH (ref 4.0–10.5)

## 2016-06-02 LAB — BASIC METABOLIC PANEL
Anion gap: 16 — ABNORMAL HIGH (ref 5–15)
BUN: 28 mg/dL — AB (ref 6–20)
CHLORIDE: 93 mmol/L — AB (ref 101–111)
CO2: 23 mmol/L (ref 22–32)
Calcium: 6.4 mg/dL — CL (ref 8.9–10.3)
Creatinine, Ser: 10.81 mg/dL — ABNORMAL HIGH (ref 0.61–1.24)
GFR calc Af Amer: 5 mL/min — ABNORMAL LOW (ref >60)
GFR, EST NON AFRICAN AMERICAN: 5 mL/min — AB (ref >60)
GLUCOSE: 112 mg/dL — AB (ref 65–99)
POTASSIUM: 5.7 mmol/L — AB (ref 3.5–5.1)
Sodium: 132 mmol/L — ABNORMAL LOW (ref 135–145)

## 2016-06-02 LAB — RENAL FUNCTION PANEL
Albumin: 2.6 g/dL — ABNORMAL LOW (ref 3.5–5.0)
Anion gap: 15 (ref 5–15)
BUN: 30 mg/dL — AB (ref 6–20)
CHLORIDE: 95 mmol/L — AB (ref 101–111)
CO2: 21 mmol/L — AB (ref 22–32)
Calcium: 6.3 mg/dL — CL (ref 8.9–10.3)
Creatinine, Ser: 11.11 mg/dL — ABNORMAL HIGH (ref 0.61–1.24)
GFR calc Af Amer: 5 mL/min — ABNORMAL LOW (ref >60)
GFR calc non Af Amer: 5 mL/min — ABNORMAL LOW (ref >60)
GLUCOSE: 137 mg/dL — AB (ref 65–99)
POTASSIUM: 5 mmol/L (ref 3.5–5.1)
Phosphorus: 4.6 mg/dL (ref 2.5–4.6)
Sodium: 131 mmol/L — ABNORMAL LOW (ref 135–145)

## 2016-06-02 LAB — GLUCOSE, CAPILLARY: Glucose-Capillary: 174 mg/dL — ABNORMAL HIGH (ref 65–99)

## 2016-06-02 LAB — PROTIME-INR
INR: 2.32 — AB (ref 0.00–1.49)
Prothrombin Time: 25.2 seconds — ABNORMAL HIGH (ref 11.6–15.2)

## 2016-06-02 LAB — HEPATITIS B SURFACE ANTIGEN: HEP B S AG: NEGATIVE

## 2016-06-02 MED ORDER — DOXERCALCIFEROL 4 MCG/2ML IV SOLN
INTRAVENOUS | Status: AC
  Filled 2016-06-02: qty 4

## 2016-06-02 MED ORDER — DOXERCALCIFEROL 4 MCG/2ML IV SOLN
INTRAVENOUS | Status: AC
Start: 2016-06-02 — End: 2016-06-02
  Filled 2016-06-02: qty 2

## 2016-06-02 MED ORDER — WARFARIN SODIUM 1 MG PO TABS
1.0000 mg | ORAL_TABLET | Freq: Once | ORAL | Status: AC
  Administered 2016-06-02: 1 mg via ORAL
  Filled 2016-06-02: qty 1

## 2016-06-02 MED ORDER — PROMETHAZINE HCL 25 MG/ML IJ SOLN
INTRAMUSCULAR | Status: AC
  Filled 2016-06-02: qty 1

## 2016-06-02 MED ORDER — OXYCODONE-ACETAMINOPHEN 5-325 MG PO TABS
ORAL_TABLET | ORAL | Status: AC
  Filled 2016-06-02: qty 2

## 2016-06-02 MED ORDER — DOXERCALCIFEROL 4 MCG/2ML IV SOLN
10.0000 ug | Freq: Every day | INTRAVENOUS | Status: DC
  Administered 2016-06-02 – 2016-06-04 (×3): 10 ug via INTRAVENOUS
  Filled 2016-06-02 (×2): qty 6

## 2016-06-02 NOTE — Progress Notes (Signed)
ANTICOAGULATION CONSULT NOTE  Pharmacy Consult for warfarin Indication: atrial fibrillation  No Known Allergies  Patient Measurements: Height: 5\' 10"  (177.8 cm) Weight: 166 lb 14.2 oz (75.7 kg) IBW/kg (Calculated) : 73  Vital Signs: Temp: 98.2 F (36.8 C) (07/13 1155) Temp Source: Oral (07/13 1155) BP: 83/46 mmHg (07/13 1155) Pulse Rate: 69 (07/13 1155)  Labs:  Recent Labs  05/31/16 1024  06/01/16 0618  06/01/16 1814 06/02/16 0452 06/02/16 0731 06/02/16 0732  HGB 12.7*  --  14.3  --   --   --   --  12.3*  HCT 40.6  --  46.1  --   --   --   --  38.8*  PLT 312  --  365  --   --   --   --  343  LABPROT 20.6*  --  21.3*  --   --  25.2*  --   --   INR 1.77*  --  1.85*  --   --  2.32*  --   --   CREATININE 13.34*  < > 8.84*  < > 10.12* 10.81* 11.11*  --   < > = values in this interval not displayed.  Estimated Creatinine Clearance: 7.8 mL/min (by C-G formula based on Cr of 11.11).  Assessment: 55 y/o male with ESRD on HD TTS who presented to the ED 05/31/2016 with weakness and nausea. Of note, patient had a [facility] 6/[facility]. He has been unable to tolerate medication/food due to nausea and emesis since discharge on 7/2.  PTA regimen: 2 mg daily except 1 mg on Sun, last dose at home was 7/9.  INR today increased to 2.32. Hgb 12.3, plts 343- stable, no bleeding noted.  Goal of Therapy:  INR 2-3 Monitor platelets by anticoagulation protocol: Yes   Plan:  -Warfarin 1mg  PO x1 tonight -Daily INR -Monitor oral intake as this affects INR -Monitor s/sx of bleeding  Nycole Kawahara D. Brianda Beitler, PharmD, BCPS Clinical Pharmacist Pager: [phone] 06/02/2016 1:58 PM

## 2016-06-02 NOTE — Procedures (Signed)
I was present at this dialysis session. I have reviewed the session itself and made appropriate changes.   Ca about the same, [place]bu down some. Is tolerating Tums and PhosLo.  Inc IV VDRA today.  Pt w/o complaint is below outpt EDW and will need to be changes upon DC -- [facility].    Filed Weights   06/01/16 0111 06/01/16 2200 06/02/16 0700  Weight: 76.25 kg (168 lb 1.6 oz) 76.6 kg (168 lb 14 oz) 77.8 kg (171 lb 8.3 oz)     Recent Labs Lab 06/02/16 0731  NA 131*  K 5.0  CL 95*  CO2 21*  GLUCOSE 137*  BUN 30*  CREATININE 11.11*  CALCIUM 6.3*  PHOS 4.6     Recent Labs Lab 05/31/16 1024 06/01/16 0618 06/02/16 0732  WBC 12.2* 12.2* 13.7*  NEUTROABS 7.3  --   --   HGB 12.7* 14.3 12.3*  HCT 40.6 46.1 38.8*  MCV 88.1 87.8 87.0  PLT 312 365 343    Scheduled Meds: . amiodarone  400 mg Oral Daily  . calcium acetate  667 mg Oral TID WC  . calcium carbonate  800 mg of elemental calcium Oral QHS  . calcium carbonate  800 mg of elemental calcium Oral TID  . Chlorhexidine Gluconate Cloth  6 each Topical Q0600  . docusate sodium  200 mg Oral Daily  . doxercalciferol  10 mcg Intravenous Daily  . metoprolol tartrate  25 mg Oral BID  . mupirocin ointment  1 application Nasal BID  . predniSONE  10 mg Oral Daily  . sodium chloride flush  3 mL Intravenous Q12H  . sodium chloride flush  3 mL Intravenous Q12H  . Warfarin - Pharmacist Dosing Inpatient   Does not apply q1800   Continuous Infusions: . dextrose 5 % and 0.45% NaCl 1,000 mL infusion 75 mL/hr at 06/02/16 0100   PRN Meds:.sodium chloride, lidocaine, oxyCODONE-acetaminophen, promethazine, sodium chloride flush   Sabra Heckyan Andi Mahaffy  MD 06/02/2016, 8:35 AM

## 2016-06-02 NOTE — Telephone Encounter (Signed)
Received surgical clearance from [facility].Dr.Jordan advised ok to hold Coumadin 5 days prior to surgery.Form faxed back to fax # [phone].

## 2016-06-02 NOTE — Progress Notes (Signed)
Family Medicine Teaching Service Daily Progress Note Intern Pager: [phone]  Patient name: Samuel Rowland Medical record number: [id number] Date of birth: 1961/04/06 Age: 54 y.o. Gender: male  Primary Care Provider: Lucilla Edin, MD Consultants: Nephrology  Code Status: Full   Pt Overview and Major Events to Date:  Emergent HD  Assessment and Plan: Josearmando Kuhnert is a 55 y.o. male with a past medical history significant for ESRD on dialysis, paroxysmal A.fib, hypertension who presented to ED for generalized weakness and hypocalcemia s/p parathyroidectomy.  #Hypocalcemia, resolving Improved with overnight HD, will continue to treat appropriately with IV. Will transition to po prior to discharge. Considering liquid form for calcium supplementation.  --HD session this am, will receive IV calcium --Follow up  am BMP --Hold calcitriol capsule,  --Tums  between meals and qHS per nephrology  #Hyperkalemia, acute  Potassium 5.0 prior to today HD session down from 6.0 on admission, should normalize with patient back on regular dialysis schedule. --Check am BMP  #Generalized Weakness, subacute Patient feels very weak and tired since discharge from hospital after parathyroidectomy. Weakness is 2/2 to decrease po intake from multiple emesis episodes in the setting of calcium supplementation intolerance. Patient has also missed an HD session which most likely exacerbated his condition. 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 Patient creatinine this morning was 11.11. Will undergo 2nd HD session this am. --Nephrology following closely  #Penile lesion, acute Patient still experiencing moderate pain, was prescribed lidocaine gel yesterday for additional pain control. --Schedule follow up with [facility] post discharge  --Continue oxycodone-acetamipnophen 10-325 q4 PRN    #Paroxysmal Atrial Fibrillation. Chadsvasc1. Will continue current home regimen --Warfarin per pharmacy --Amiodarone  --Check INR --On telemetry  #Hypertension Patient slightly hypotensive this am --Continue to hold Metoprolol 25 mg  #Renal transplant, rejection --Continue prednisone    FEN/GI: Renal diet with fluid restriction PPx: Warfarin   Disposition: Home after second HD session  Subjective:  Patient was a a little bit nauseated this morning and was still complaining of moderate scrotal pain. Patient was in dialysis and was doing fine. Denies emesis, diarrhea, cramping and abdominal pain.  Objective: Temp:  [98.2 F (36.8 C)-98.4 F (36.9 C)] 98.3 F (36.8 C) (07/13 0700) Pulse Rate:  [64-85] 85 (07/13 0900) Resp:  [16-17] 16 (07/13 0700) BP: (91-105)/(49-80) 105/80 mmHg (07/13 0900) SpO2:  [92 %-96 %] 96 % (07/13 0700) Weight:  [168 lb 14 oz (76.6 kg)-171 lb 8.3 oz (77.8 kg)] 171 lb 8.3 oz (77.8 kg) (07/13 0700)   Physical Exam: General: Patient appears fatigued, but able to answer questions during exam  HEENT: PEERLA, dry mucous membrane  Neck: supple, no JVD  Cardiovascular: RRR, refer murmur from fistula, no gallops, rubs. Weak pulses  Respiratory: Moving air clearly bilaterally, no increase work of breathing,no wheezes,no crackles  Abdomen: RLQ pain upon palpation, no distended, normal BS  MSK: Range of motion intact, upper and lower extremities  Skin: no rashes, warm and well perfused  Neuro: CN2-12 intact. Normal strength upper and lower extremity, sensation intact bilaterally. 3-4 beats of clonus in feet bilaterally. Negative chvostek and Trousseau's signs  Psych: AOx4, normal mood and affect  GU: Lesion noted on the distal end of the penis close to meatus, no drainage noted.  Laboratory:  Recent Labs Lab 05/31/16 1024 06/01/16 0618 06/02/16 0732  WBC 12.2* 12.2* 13.7*  HGB 12.7* 14.3  12.3*  HCT 40.6 46.1 38.8*  PLT 312 365 343     Recent Labs Lab 05/28/16 1347 05/31/16 1024  06/01/16 1814 06/02/16 0452 06/02/16 0731  NA  --  134*  < > 131* 132* 131*  K  --  6.0*  < > 6.2* 5.7* 5.0  CL  --  95*  < > 95* 93* 95*  CO2  --  21*  < > 22 23 21*  BUN  --  35*  < > 23* 28* 30*  CREATININE  --  13.34*  < > 10.12* 10.81* 11.11*  CALCIUM  --  5.2*  < > 6.4* 6.4* 6.3*  PROT 8.8* 8.5*  --   --   --   --   BILITOT 0.4 0.9  --   --   --   --   ALKPHOS 407* 448*  --   --   --   --   ALT 32 22  --   --   --   --   AST 31 25  --   --   --   --   GLUCOSE  --  74  < > 171* 112* 137*  < > = values in this interval not displayed.  Imaging/Diagnostic Tests:  Lovena NeighboursAbdoulaye Roneshia Drew, MD 06/02/2016, 9:10 AM PGY-1, [facility] FPTS Intern pager: [phone], text pages welcome

## 2016-06-02 NOTE — Progress Notes (Signed)
CRITICAL VALUE ALERT  Critical value received:  Calcium 6.4   Date of notification:  06/02/16  Time of notification:  0708  Critical value read back:Yes.    Nurse who received alert:  Christen ButterJoy Staceyann Knouff, RN  MD notified (1st page):  Family Medicine Teaching Service  Time of first page:  0709  MD notified (2nd page): Family Medicine Teaching Service   Time of second page: [phone]   Responding MD:  Dr. Natale MilchLancaster  Time MD responded:  [phone]  Use High Calcium bath with dialysis treatment and continue Tums as ordered. No further orders received.

## 2016-06-03 LAB — RENAL FUNCTION PANEL
ALBUMIN: 2.6 g/dL — AB (ref 3.5–5.0)
ANION GAP: 15 (ref 5–15)
BUN: 17 mg/dL (ref 6–20)
CALCIUM: 7.5 mg/dL — AB (ref 8.9–10.3)
CO2: 24 mmol/L (ref 22–32)
Chloride: 95 mmol/L — ABNORMAL LOW (ref 101–111)
Creatinine, Ser: 7.56 mg/dL — ABNORMAL HIGH (ref 0.61–1.24)
GFR calc non Af Amer: 7 mL/min — ABNORMAL LOW (ref >60)
GFR, EST AFRICAN AMERICAN: 8 mL/min — AB (ref >60)
Glucose, Bld: 138 mg/dL — ABNORMAL HIGH (ref 65–99)
PHOSPHORUS: 3.5 mg/dL (ref 2.5–4.6)
Potassium: 5 mmol/L (ref 3.5–5.1)
SODIUM: 134 mmol/L — AB (ref 135–145)

## 2016-06-03 LAB — PROTIME-INR
INR: 2.69 — ABNORMAL HIGH (ref 0.00–1.49)
Prothrombin Time: 28.2 seconds — ABNORMAL HIGH (ref 11.6–15.2)

## 2016-06-03 MED ORDER — WARFARIN SODIUM 1 MG PO TABS
1.0000 mg | ORAL_TABLET | Freq: Once | ORAL | Status: AC
  Administered 2016-06-03: 1 mg via ORAL
  Filled 2016-06-03: qty 1

## 2016-06-03 NOTE — Progress Notes (Signed)
ANTICOAGULATION CONSULT NOTE  Pharmacy Consult for warfarin Indication: atrial fibrillation  No Known Allergies  Patient Measurements: Height: 5\' 10"  (177.8 cm) Weight: 173 lb (78.472 kg) IBW/kg (Calculated) : 73  Vital Signs: Temp: 97.5 F (36.4 C) (07/14 0812) Temp Source: Oral (07/14 0812) BP: 102/67 mmHg (07/14 0812) Pulse Rate: 63 (07/14 0812)  Labs:  Recent Labs  06/01/16 0618  06/02/16 0452 06/02/16 0731 06/02/16 0732 06/03/16 0513  HGB 14.3  --   --   --  12.3*  --   HCT 46.1  --   --   --  38.8*  --   PLT 365  --   --   --  343  --   LABPROT 21.3*  --  25.2*  --   --  28.2*  INR 1.85*  --  2.32*  --   --  2.69*  CREATININE 8.84*  < > 10.81* 11.11*  --  7.56*  < > = values in this interval not displayed.  Estimated Creatinine Clearance: 11.4 mL/min (by C-G formula based on Cr of 7.56).  Assessment: 55 y/o male with ESRD on HD TTS who presented to the ED 05/31/2016 with weakness and nausea. Of note, patient [facility]. He has been unable to tolerate medication/food due to nausea and emesis since discharge on 7/2.  PTA regimen: 2 mg daily except 1 mg on Sun, last dose at home was 7/9.  INR today increased to 2.69  Goal of Therapy:  INR 2-3 Monitor platelets by anticoagulation protocol: Yes   Plan:  -Warfarin 1mg  PO x1 tonight -Daily INR -Monitor oral intake as this affects INR -Monitor s/sx of bleeding  Isaac BlissMichael Bevelyn Arriola, PharmD, BCPS, [facility] Clinical Pharmacist Pager [phone] 06/03/2016 12:25 PM

## 2016-06-03 NOTE — Progress Notes (Signed)
Family Medicine Teaching Service Daily Progress Note Intern Pager: [phone]  Patient name: Samuel Rowland Medical record number: [id number] Date of birth: 1961-10-31 Age: 55 y.o. Gender: male  Primary Care Provider: Lucilla Edin, MD Consultants: Nephrology  Code Status: Full   Pt Overview and Major Events to Date:  Emergent HD  Assessment and Plan: Samuel Rowland is a 55 y.o. male with a past medical history significant for ESRD on dialysis, paroxysmal A.fib, hypertension who presented to ED for generalized weakness and hypocalcemia s/p parathyroidectomy.  #Hypocalcemia, resolving Patient hypocalcemia has improved to 7.5 post HD from 6.3. Patient schedule for regular outpatient HD session tomorrow. --Hold calcitriol capsule,  --Tums  between meals and qHS per nephrology  #Hyperkalemia, acute  Calcium is 5.0 post HD and have stabilized with dialysis. Will continue to monitor  #Generalized Weakness, subacute Patient feeling much better after two dialysis sessions, getting back his strength and continue to improve.  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 Patient creatinine this morning is 7.56 post HD.  --Nephrology following closely  #Penile lesion, acute/calcium deposition Patient still experiencing moderate pain, was prescribed lidocaine gel for additional pain control. Patient showed [place] calcium stone that have "popped out" of his scrotal sac overnight. Patient currently have calcium deposition in his scrotum. --Schedule follow up with [facility] post discharge  --Continue oxycodone-acetamipnophen 10-325 q4 PRN   #Paroxysmal Atrial Fibrillation. Chadsvasc1. Will continue current home regimen --Warfarin per pharmacy --Amiodarone  --Check INR --On telemetry  #Hypertension Patient slightly hypotensive this am --Continue to hold Metoprolol 25 mg  #Renal transplant, rejection --Continue  prednisone    FEN/GI: Renal diet with fluid restriction PPx: Warfarin   Disposition: Home after second HD session  Subjective:  Patient feeling stronger this morning after dialysis yesterday. Still complains about groin pain, otherwise doing fine.  Objective: Temp:  [97.4 F (36.3 C)-98.2 F (36.8 C)] 97.5 F (36.4 C) (07/14 0506) Pulse Rate:  [64-85] 68 (07/14 0506) Resp:  [16-19] 16 (07/14 0506) BP: (83-131)/(46-80) 102/61 mmHg (07/14 0506) SpO2:  [92 %-97 %] 92 % (07/14 0506) Weight:  [166 lb 14.2 oz (75.7 kg)-173 lb (78.472 kg)] 173 lb (78.472 kg) (07/14 0506)   Physical Exam: General: Patient appears fatigued, but able to answer questions during exam  HEENT: PEERLA, dry mucous membrane  Neck: supple, no JVD  Cardiovascular: RRR, refer murmur from fistula, no gallops, rubs. Weak pulses  Respiratory: Moving air clearly bilaterally, no increase work of breathing,no wheezes,no crackles  Abdomen: RLQ pain upon palpation, no distended, normal BS  MSK: Range of motion intact, upper and lower extremities  Skin: no rashes, warm and well perfused  Neuro: CN2-12 intact. Normal strength upper and lower extremity, sensation intact bilaterally. 3-4 beats of clonus in feet bilaterally. Negative chvostek and Trousseau's signs  Psych: AOx4, normal mood and affect  GU: Lesion noted on the distal end of the penis close to meatus, no drainage noted.  Laboratory:  Recent Labs Lab 05/31/16 1024 06/01/16 0618 06/02/16 0732  WBC 12.2* 12.2* 13.7*  HGB 12.7* 14.3 12.3*  HCT 40.6 46.1 38.8*  PLT 312 365 343    Recent Labs Lab 05/28/16 1347 05/31/16 1024  06/02/16 0452 06/02/16 0731 06/03/16 0513  NA  --  134*  < > 132* 131* 134*  K  --  6.0*  < > 5.7* 5.0 5.0  CL  --  95*  < > 93* 95* 95*  CO2  --  21*  < > 23 21* 24  BUN  --  35*  < > 28* 30* 17  CREATININE  --  13.34*  < > 10.81* 11.11* 7.56*  CALCIUM  --  5.2*  < > 6.4* 6.3* 7.5*  PROT 8.8* 8.5*  --   --   --   --    BILITOT 0.4 0.9  --   --   --   --   ALKPHOS 407* 448*  --   --   --   --   ALT 32 22  --   --   --   --   AST 31 25  --   --   --   --   GLUCOSE  --  74  < > 112* 137* 138*  < > = values in this interval not displayed.  Imaging/Diagnostic Tests:  Lovena NeighboursAbdoulaye Yaremi Stahlman, MD 06/03/2016, 7:29 AM PGY-1, [facility] FPTS Intern pager: [phone], text pages welcome

## 2016-06-03 NOTE — Care Management Note (Addendum)
Case Management Note  Patient Details  Name: Samuel Rowland MRN: [id number] Date of Birth: 12/12/1960  Subjective/Objective:        CM following for progression and d/c planning.             Action/Plan: 06/03/2016 CM [facility], however PT eval does not recommend HH. Pt up and ambulatory in room at this time. No HH needs identified which require skilled needs.   Expected Discharge Date:  06/02/16               Expected Discharge Plan:  Home/Self Care  In-House Referral:  NA  Discharge planning Services  CM Consult  Post Acute Care Choice:  NA Choice offered to:  NA  DME Arranged:    DME Agency:     HH Arranged:  NA HH Agency:  NA  Status of Service:  In process, will continue to follow  If discussed at Long Length of Stay Meetings, dates discussed:    Additional Comments:  Starlyn SkeansRoyal, Tamar Lipscomb U, RN 06/03/2016, 12:47 PM

## 2016-06-03 NOTE — Care Management Important Message (Signed)
Important Message  Patient Details  Name: Samuel Rowland MRN: [id number] Date of Birth: 11/09/1961   Medicare Important Message Given:  Yes    Jazzmine Kleiman, Annamarie MajorCheryl U, RN 06/03/2016, 11:47 AM

## 2016-06-03 NOTE — Progress Notes (Signed)
Patient and patient's wife state the sores on his penis are getting worse. Patient has not allowed this RN to assess his wounds. Patient states he feels the doctors "are not doing enough to stop the progression of this disease" and they feel like he needs strong antibiotics. Family medicine on call notified and stated they would come speak to the patient.  Leanna BattlesEckelmann, Wei Poplaski Eileen, RN.

## 2016-06-03 NOTE — Evaluation (Signed)
Physical Therapy Evaluation Patient Details Name: Samuel Rowland MRN: [id number] DOB: 11/28/1960 Today's Date: 06/03/2016   History of Present Illness  55 y.o. male with h/o renal transplant, ESRD on HD, recent parathyroidectomy 04/17/16 admitted with hypocalcemia, penile lesion.   Clinical Impression  Pt is independent with mobility, he ambulated 250' without loss of balance. No further PT indicated, will sign off. He is safe to DC home from PT standpoint.     Follow Up Recommendations No PT follow up    Equipment Recommendations  None recommended by PT    Recommendations for Other Services       Precautions / Restrictions Precautions Precautions: None Restrictions Weight Bearing Restrictions: No      Mobility  Bed Mobility Overal bed mobility: Independent                Transfers Overall transfer level: Independent                  Ambulation/Gait Ambulation/Gait assistance: Independent Ambulation Distance (Feet): 250 Feet Assistive device: None Gait Pattern/deviations: WFL(Within Functional Limits)     General Gait Details: steady, no LOB  Stairs            Wheelchair Mobility    Modified Rankin (Stroke Patients Only)       Balance Overall balance assessment: Independent                                           Pertinent Vitals/Pain Pain Assessment: 0-10 Pain Location: groin Pain Intervention(s): Premedicated before session    Home Living Family/patient expects to be discharged to:: Private residence Living Arrangements: Spouse/significant other Available Help at Discharge: Family         Home Layout: Two level        Prior Function Level of Independence: Independent               Hand Dominance        Extremity/Trunk Assessment   Upper Extremity Assessment: Overall WFL for tasks assessed           Lower Extremity Assessment: Overall WFL for tasks assessed      Cervical / Trunk  Assessment: Normal  Communication   Communication: No difficulties  Cognition Arousal/Alertness: Awake/alert Behavior During Therapy: WFL for tasks assessed/performed Overall Cognitive Status: Within Functional Limits for tasks assessed                      General Comments      Exercises        Assessment/Plan    PT Assessment Patent does not need any further PT services  PT Diagnosis     PT Problem List    PT Treatment Interventions     PT Goals (Current goals can be found in the Care Plan section) Acute Rehab PT Goals PT Goal Formulation: All assessment and education complete, DC therapy    Frequency     Barriers to discharge        Co-evaluation               End of Session Equipment Utilized During Treatment: Gait belt Activity Tolerance: Patient tolerated treatment well Patient left: in bed;with call bell/phone within reach;with nursing/sitter in room Nurse Communication: Mobility status         Time: [id number] PT Time Calculation (min) (ACUTE ONLY): 11 min  Charges:   PT Evaluation $PT Eval Low Complexity: 1 Procedure     PT G Codes:        Tamala Ser 06/03/2016, 10:32 AM [phone]

## 2016-06-03 NOTE — Progress Notes (Signed)
Patient ID: Samuel Rowland, male   DOB: 19-Apr-1961, 55 y.o.   MRN: [id number] S:feels well no complaints O:BP 102/67 mmHg  Pulse 63  Temp(Src) 97.5 F (36.4 C) (Oral)  Resp 16  Ht  (1.778 m)  Wt 78.472 kg (173 lb)  BMI 24.82 kg/m2  SpO2 95%  Intake/Output Summary (Last 24 hours) at 06/03/16 0944 Last data filed at 06/03/16 0935  Gross per 24 hour  Intake   1762 ml  Output   2997 ml  Net  -1235 ml   Intake/Output: I/O last 3 completed shifts: In: 1402 [P.O.:1402] Out: 2997 [Other:2997]  Intake/Output this shift:  Total I/O In: 360 [P.O.:360] Out: -  Weight change: -0.9 kg (-1 lb 15.8 oz) Gen:NAD CVS:no rub Resp:cta WUJ:WJXBJY Ext:no edema   Recent Labs Lab 05/28/16 1315 05/28/16 1347 05/31/16 1024  06/01/16 0432 06/01/16 0618 06/01/16 1511 06/01/16 1814 06/02/16 0452 06/02/16 0731 06/03/16 0513  NA  --   --  134*  < > 137 134* 133* 131* 132* 131* 134*  K  --   --  6.0*  < > 5.0 5.5* 5.8* 6.2* 5.7* 5.0 5.0  CL  --   --  95*  < > 97* 96* 95* 95* 93* 95* 95*  CO2  --   --  21*  < > 21* 24  GLUCOSE  --   --  74  < > 69 64* 182* 171* 112* 137* 138*  BUN  --   --  35*  < > 16 17 21* 23* 28* 30* 17  CREATININE  --   --  13.34*  < > 8.44* 8.84* 10.00* 10.12* 10.81* 11.11* 7.56*  ALBUMIN 3.1* 3.1* 3.0*  --  3.0*  --   --   --   --  2.6* 2.6*  CALCIUM  --   --  5.2*  < > 7.2* 7.0* 6.4* 6.4* 6.4* 6.3* 7.5*  PHOS  --   --  5.7*  --  4.3  --   --   --   --  4.6 3.5  AST  --  31 25  --   --   --   --   --   --   --   --   ALT  --  32 22  --   --   --   --   --   --   --   --   < > = values in this interval not displayed. Liver Function Tests:  Recent Labs Lab 05/28/16 1347 05/31/16 1024 06/01/16 0432 06/02/16 0731 06/03/16 0513  AST 31 25  --   --   --   ALT 32 22  --   --   --   ALKPHOS 407* 448*  --   --   --   BILITOT 0.4 0.9  --   --   --   PROT 8.8* 8.5*  --   --   --   ALBUMIN 3.1* 3.0* 3.0* 2.6* 2.6*    Recent Labs Lab  05/31/16 1024  LIPASE 21   No results for input(s): AMMONIA in the last 168 hours. CBC:  Recent Labs Lab 05/28/16 1135 05/31/16 1024 06/01/16 0618 06/02/16 0732  WBC 9.9 12.2* 12.2* 13.7*  NEUTROABS  --  7.3  --   --   HGB 12.7* 12.7* 14.3 12.3*  HCT 41.2 40.6 46.1 38.8*  MCV 88.8 88.1 87.8 87.0  PLT 247 312 365  343   Cardiac Enzymes: No results for input(s): CKTOTAL, CKMB, CKMBINDEX, TROPONINI in the last 168 hours. CBG:  Recent Labs Lab 05/31/16 1949 05/31/16 2101 05/31/16 2206 05/31/16 2355 06/02/16 2050  GLUCAP 57* 99 93 95 174*    Iron Studies: No results for input(s): IRON, TIBC, TRANSFERRIN, FERRITIN in the last 72 hours. Studies/Results: No results found. Marland Kitchen. amiodarone  400 mg Oral Daily  . calcium acetate  667 mg Oral TID WC  . calcium carbonate  800 mg of elemental calcium Oral QHS  . calcium carbonate  800 mg of elemental calcium Oral TID  . Chlorhexidine Gluconate Cloth  6 each Topical Q0600  . docusate sodium  200 mg Oral Daily  . doxercalciferol  10 mcg Intravenous Daily  . metoprolol tartrate  25 mg Oral BID  . mupirocin ointment  1 application Nasal BID  . predniSONE  10 mg Oral Daily  . sodium chloride flush  3 mL Intravenous Q12H  . sodium chloride flush  3 mL Intravenous Q12H  . Warfarin - Pharmacist Dosing Inpatient   Does not apply q1800    BMET    Component Value Date/Time   NA 134* 06/03/2016 0513   K 5.0 06/03/2016 0513   CL 95* 06/03/2016 0513   CO2 24 06/03/2016 0513   GLUCOSE 138* 06/03/2016 0513   BUN 17 06/03/2016 0513   CREATININE 7.56* 06/03/2016 0513   CALCIUM 7.5* 06/03/2016 0513   GFRNONAA 7* 06/03/2016 0513   GFRAA 8* 06/03/2016 0513   CBC    Component Value Date/Time   WBC 13.7* 06/02/2016 0732   RBC 4.46 06/02/2016 0732   HGB 12.3* 06/02/2016 0732   HCT 38.8* 06/02/2016 0732   PLT 343 06/02/2016 0732   MCV 87.0 06/02/2016 0732   MCH 27.6 06/02/2016 0732   MCHC 31.7 06/02/2016 0732   RDW 19.8* 06/02/2016  0732   LYMPHSABS 2.6 05/31/2016 1024   MONOABS 1.4* 05/31/2016 1024   EOSABS 0.9* 05/31/2016 1024   BASOSABS 0.1 05/31/2016 1024    Assessment/Plan:  1. ESRD from [place]- on TTS schedule 2. Hypocalcemia- following Ptx and inability to tolerate po vitamin D.  On 3.5 calcium bath and tums.  Responding to IV hectoral 3. HTN 4. Anemia of chronic kidney disease 5. A Fib 6. H/o scrotal abscess with chronic wound. 7. Disposition- calcium stable, hopeful discharge tomorrow after HD.  Samuel Rowland

## 2016-06-03 NOTE — Progress Notes (Signed)
Interval note  Went to assess the patient after Dr. Artist PaisYoo. When she went to write with the patient, they contacted the patient's previous [facility] [place].  That PCP was concerned about fournier's gangrene based on pictures.    When I went to my with the patient, he states that the lesions haven't changed much over the last 24 hours, but he's noted worsening calcifications since this hospitalization. He thinks that his increasing calcium has made the calcification towards. He is able to painfully pull calcifications out of his scrotum.    On exam, several calcifications noted in the scrotal tissue, he had some calcifications in a small container at the bedside, some measuring >6010mm. Penis as noted below.        Blood pressure 150/93, pulse 67, temperature 97.4 F (36.3 C), temperature source Oral, resp. rate 18, height 5\' 10"  (1.778 m), weight 172 lb 15.2 oz (78.45 kg), SpO2 95 %.  The patient does not appear septic. He has been afebrile, with a normal heart rate.  Discussed that I'm not quite sure that this is infectious in nature.  We'll hold off on antibiotics for now. I've taken pictures for documentation so we can see how the area progresses.  We have paged to pathology to see what they think of the lesion and whether they believe that his calcium baths could be worsening this.  The patient understands that this is a nonemergent thing. He is very concerned about pain control on discharge and would like for things to be addressed prior to discharge if possible. I noted that I would touch base with the primary team caring for him in the morning.  Joanna Puffrystal S. Dnyla Antonetti, MD [facility] Resident  06/03/2016, 10:25 PM

## 2016-06-04 LAB — RENAL FUNCTION PANEL
ANION GAP: 14 (ref 5–15)
Albumin: 2.8 g/dL — ABNORMAL LOW (ref 3.5–5.0)
BUN: 27 mg/dL — ABNORMAL HIGH (ref 6–20)
CHLORIDE: 98 mmol/L — AB (ref 101–111)
CO2: 19 mmol/L — AB (ref 22–32)
Calcium: 7.8 mg/dL — ABNORMAL LOW (ref 8.9–10.3)
Creatinine, Ser: 9.33 mg/dL — ABNORMAL HIGH (ref 0.61–1.24)
GFR, EST AFRICAN AMERICAN: 6 mL/min — AB (ref >60)
GFR, EST NON AFRICAN AMERICAN: 6 mL/min — AB (ref >60)
Glucose, Bld: 103 mg/dL — ABNORMAL HIGH (ref 65–99)
POTASSIUM: 5.2 mmol/L — AB (ref 3.5–5.1)
Phosphorus: 3.8 mg/dL (ref 2.5–4.6)
Sodium: 131 mmol/L — ABNORMAL LOW (ref 135–145)

## 2016-06-04 LAB — CBC
HEMATOCRIT: 37.9 % — AB (ref 39.0–52.0)
Hemoglobin: 11.7 g/dL — ABNORMAL LOW (ref 13.0–17.0)
MCH: 27.2 pg (ref 26.0–34.0)
MCHC: 30.9 g/dL (ref 30.0–36.0)
MCV: 88.1 fL (ref 78.0–100.0)
PLATELETS: 335 10*3/uL (ref 150–400)
RBC: 4.3 MIL/uL (ref 4.22–5.81)
RDW: 20.1 % — AB (ref 11.5–15.5)
WBC: 13 10*3/uL — AB (ref 4.0–10.5)

## 2016-06-04 LAB — PROTIME-INR
INR: 3.1 — ABNORMAL HIGH (ref 0.00–1.49)
Prothrombin Time: 31.3 seconds — ABNORMAL HIGH (ref 11.6–15.2)

## 2016-06-04 MED ORDER — HEPARIN SODIUM (PORCINE) 1000 UNIT/ML DIALYSIS: 20.0000 [IU]/kg | INTRAMUSCULAR | Status: DC | PRN

## 2016-06-04 MED ORDER — HEPARIN SODIUM (PORCINE) 1000 UNIT/ML DIALYSIS
20.0000 [IU]/kg | INTRAMUSCULAR | Status: DC | PRN
  Administered 2016-06-04: 1600 [IU] via INTRAVENOUS_CENTRAL

## 2016-06-04 MED ORDER — DEXTROSE-NACL 5-0.45 % IV SOLN
INTRAVENOUS | Status: DC
  Administered 2016-06-04: 06:00:00 via INTRAVENOUS

## 2016-06-04 MED ORDER — OXYCODONE-ACETAMINOPHEN 5-325 MG PO TABS
ORAL_TABLET | ORAL | Status: AC
  Administered 2016-06-04: 2 via ORAL
  Filled 2016-06-04: qty 2

## 2016-06-04 MED ORDER — PROMETHAZINE HCL 25 MG/ML IJ SOLN
INTRAMUSCULAR | Status: AC
  Administered 2016-06-04: 12.5 mg via INTRAVENOUS
  Filled 2016-06-04: qty 1

## 2016-06-04 MED ORDER — DOXERCALCIFEROL 4 MCG/2ML IV SOLN
INTRAVENOUS | Status: AC
  Filled 2016-06-04: qty 6

## 2016-06-04 NOTE — Progress Notes (Signed)
Discharged papers printed ,explained and given to patient.Questions answered satisfactorily at the time of discharged.Discharged on stable condition with wife.

## 2016-06-04 NOTE — Progress Notes (Signed)
ANTICOAGULATION CONSULT NOTE  Pharmacy Consult for warfarin Indication: atrial fibrillation  No Known Allergies  Patient Measurements: Height: 5\' 10"  (177.8 cm) Weight: 178 lb 12.7 oz (81.1 kg) IBW/kg (Calculated) : 73  Vital Signs: Temp: 97.6 F (36.4 C) (07/15 1259) Temp Source: Oral (07/15 1259) BP: 113/53 mmHg (07/15 1259) Pulse Rate: 66 (07/15 1259)  Labs:  Recent Labs  06/02/16 0452 06/02/16 0731 06/02/16 0732 06/03/16 0513 06/04/16 0546 06/04/16 0757 06/04/16 0758  HGB  --   --  12.3*  --   --  11.7*  --   HCT  --   --  38.8*  --   --  37.9*  --   PLT  --   --  343  --   --  335  --   LABPROT 25.2*  --   --  28.2* 31.3*  --   --   INR 2.32*  --   --  2.69* 3.10*  --   --   CREATININE 10.81* 11.11*  --  7.56*  --   --  9.33*    Estimated Creatinine Clearance: 9.2 mL/min (by C-G formula based on Cr of 9.33).  Assessment: 55 y/o male with ESRD on HD TTS who presented to the ED 05/31/2016 with weakness and nausea. Of note, patient had [facility]. He has been unable to tolerate medication/food due to nausea and emesis since discharge on 7/2.  PTA regimen: 2 mg daily except 1 mg on Sun, last dose at home was 7/9.  INR today increased to 3.1  Goal of Therapy:  INR 2-3 Monitor platelets by anticoagulation protocol: Yes   Plan:   No coumadin today Daily INR Monitor oral intake as this affects INR  Ulyses SouthwardMinh Pham, PharmD Pager: [phone] 06/04/2016 1:30 PM

## 2016-06-04 NOTE — Progress Notes (Signed)
Patient ID: Samuel Rowland, male   DOB: Aug 31, 1961, 55 y.o.   MRN: [id number] S:Patient was seen on dialysis and the procedure was supervised. BFR 300 Via LUE AVF BP is stable.  Patient appears to be tolerating treatment well   O:BP 102/70 mmHg  Pulse 64  Temp(Src) 97.5 F (36.4 C) (Oral)  Resp 22  Ht  (1.778 m)  Wt 81.1 kg (178 lb 12.7 oz)  BMI 25.65 kg/m2  SpO2 100%  Intake/Output Summary (Last 24 hours) at 06/04/16 0911 Last data filed at 06/04/16 4098  Gross per 24 hour  Intake 3342.5 ml  Output      0 ml  Net 3342.5 ml   Intake/Output: I/O last 3 completed shifts: In: 3622.5 [P.O.:1880; I.V.:1742.5] Out: 0   Intake/Output this shift:    Weight change: 2.75 kg (6 lb 1 oz)    Recent Labs Lab 05/28/16 1315 05/28/16 1347 05/31/16 1024  06/01/16 0432 06/01/16 0618 06/01/16 1511 06/01/16 1814 06/02/16 0452 06/02/16 0731 06/03/16 0513 06/04/16 0758  NA  --   --  134*  < > 137 134* 133* 131* 132* 131* 134* 131*  K  --   --  6.0*  < > 5.0 5.5* 5.8* 6.2* 5.7* 5.0 5.0 5.2*  CL  --   --  95*  < > 97* 96* 95* 95* 93* 95* 95* 98*  CO2  --   --  21*  < > 21* 24 19*  GLUCOSE  --   --  74  < > 69 64* 182* 171* 112* 137* 138* 103*  BUN  --   --  35*  < > 16 17 21* 23* 28* 30* 17 27*  CREATININE  --   --  13.34*  < > 8.44* 8.84* 10.00* 10.12* 10.81* 11.11* 7.56* 9.33*  ALBUMIN 3.1* 3.1* 3.0*  --  3.0*  --   --   --   --  2.6* 2.6* 2.8*  CALCIUM  --   --  5.2*  < > 7.2* 7.0* 6.4* 6.4* 6.4* 6.3* 7.5* 7.8*  PHOS  --   --  5.7*  --  4.3  --   --   --   --  4.6 3.5 3.8  AST  --  31 25  --   --   --   --   --   --   --   --   --   ALT  --  32 22  --   --   --   --   --   --   --   --   --   < > = values in this interval not displayed. Liver Function Tests:  Recent Labs Lab 05/28/16 1347 05/31/16 1024  06/02/16 0731 06/03/16 0513 06/04/16 0758  AST 31 25  --   --   --   --   ALT 32 22  --   --   --   --   ALKPHOS 407* 448*  --   --   --   --    BILITOT 0.4 0.9  --   --   --   --   PROT 8.8* 8.5*  --   --   --   --   ALBUMIN 3.1* 3.0*  < > 2.6* 2.6* 2.8*  < > = values in this interval not displayed.  Recent Labs Lab 05/31/16 1024  LIPASE 21   No results for input(s):  AMMONIA in the last 168 hours. CBC:  Recent Labs Lab 05/28/16 1135 05/31/16 1024 06/01/16 0618 06/02/16 0732 06/04/16 0757  WBC 9.9 12.2* 12.2* 13.7* 13.0*  NEUTROABS  --  7.3  --   --   --   HGB 12.7* 12.7* 14.3 12.3* 11.7*  HCT 41.2 40.6 46.1 38.8* 37.9*  MCV 88.8 88.1 87.8 87.0 88.1  PLT 247 312 365 343 335   Cardiac Enzymes: No results for input(s): CKTOTAL, CKMB, CKMBINDEX, TROPONINI in the last 168 hours. CBG:  Recent Labs Lab 05/31/16 1949 05/31/16 2101 05/31/16 2206 05/31/16 2355 06/02/16 2050  GLUCAP 57* 99 93 95 174*    Iron Studies: No results for input(s): IRON, TIBC, TRANSFERRIN, FERRITIN in the last 72 hours. Studies/Results: No results found. Marland Kitchen. amiodarone  400 mg Oral Daily  . calcium acetate  667 mg Oral TID WC  . calcium carbonate  800 mg of elemental calcium Oral QHS  . calcium carbonate  800 mg of elemental calcium Oral TID  . Chlorhexidine Gluconate Cloth  6 each Topical Q0600  . docusate sodium  200 mg Oral Daily  . doxercalciferol  10 mcg Intravenous Daily  . metoprolol tartrate  25 mg Oral BID  . mupirocin ointment  1 application Nasal BID  . predniSONE  10 mg Oral Daily  . sodium chloride flush  3 mL Intravenous Q12H  . sodium chloride flush  3 mL Intravenous Q12H  . Warfarin - Pharmacist Dosing Inpatient   Does not apply q1800    BMET    Component Value Date/Time   NA 131* 06/04/2016 0758   K 5.2* 06/04/2016 0758   CL 98* 06/04/2016 0758   CO2 19* 06/04/2016 0758   GLUCOSE 103* 06/04/2016 0758   BUN 27* 06/04/2016 0758   CREATININE 9.33* 06/04/2016 0758   CALCIUM 7.8* 06/04/2016 0758   GFRNONAA 6* 06/04/2016 0758   GFRAA 6* 06/04/2016 0758   CBC    Component Value Date/Time   WBC 13.0*  06/04/2016 0757   RBC 4.30 06/04/2016 0757   HGB 11.7* 06/04/2016 0757   HCT 37.9* 06/04/2016 0757   PLT 335 06/04/2016 0757   MCV 88.1 06/04/2016 0757   MCH 27.2 06/04/2016 0757   MCHC 30.9 06/04/2016 0757   RDW 20.1* 06/04/2016 0757   LYMPHSABS 2.6 05/31/2016 1024   MONOABS 1.4* 05/31/2016 1024   EOSABS 0.9* 05/31/2016 1024   BASOSABS 0.1 05/31/2016 1024     Assessment/Plan:  1. ESRD from [place]- on TTS schedule, for HD today. 2. Hypocalcemia- following Ptx and inability to tolerate po vitamin D. On 3.5 calcium bath and tums. Responding to IV hectoral 3. HTN 4. Anemia of chronic kidney disease 5. A Fib 6. H/o scrotal abscess with chronic wound as well as penile tip lesion.  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  Awaiting Urology evaluation. 7. Disposition- calcium stable, hopeful discharge tomorrow after HD.  Julien NordmannJoseph A Aiyonna Lucado

## 2016-06-04 NOTE — Discharge Instructions (Signed)
It has been a pleasure taking care of you! You were admitted due to weakness and low calcium in your blood, which was likely due to parathyroid gland removal/surgery. We have treated with you with medication. With that your symptoms improved to the point we think it is safe to let you go home and follow up with your primary care doctor. There could be some changes made to your home medications during this hospitalization. Please, make sure to read the directions before you take them. The names and directions on how to take these medications are found on this discharge paper under medication section.  Please call and make a follow up appointment at your primary care doctor's office as soon as possible.  Take care,

## 2016-06-06 ENCOUNTER — Other Ambulatory Visit: Payer: Self-pay | Admitting: Urology

## 2016-06-06 ENCOUNTER — Encounter (HOSPITAL_BASED_OUTPATIENT_CLINIC_OR_DEPARTMENT_OTHER): Payer: Self-pay | Admitting: *Deleted

## 2016-06-06 NOTE — Discharge Summary (Signed)
[facility]  Patient name: Samuel Rowland Medical record number: [id number] Date of birth: 01/22/1961 Age: 55 y.o. Gender: male Date of Admission: 05/31/2016  Date of Discharge: 06/04/2016 Admitting Physician: Leighton Roachodd D McDiarmid, MD  Primary Care Provider: Lucilla EdinAUB, STEVE A, MD Consultants: Nephrology  Indication for Hospitalization: Weakness and hypocalcemia  Discharge Diagnoses/Problem List:  Hypocalcemia s/p parathyroidectomy  Disposition: Home  Discharge Condition: Stable  Discharge Exam: General: Patient appears less fatigued, but able to answer questions during exam  HEENT: PEERLA, MMM  Neck: supple, no JVD  Cardiovascular: RRR, refer murmur from fistula, no gallops, rubs. Strong pulses  Respiratory: Moving air clearly bilaterally, no increase work of breathing,no wheezes,no crackles  Abdomen: RLQ pain upon palpation, no distended, normal BS  MSK: Range of motion intact, upper and lower extremities  Skin: no rashes, warm and well perfused  Neuro: CN2-12 intact. Normal strength upper and lower extremity, sensation intact bilaterally. Psych: AOx4, normal mood and affect  GU: Lesion 2x2 cm noted on the distal end of the penis close to meatus, no drainage noted. Calcium deposition noted on scrotum,  Brief Hospital Course: Samuel PennaClayton Katich is a 55 y.o. male with a past medical history significant for ESRD on dialysis, paroxysmal A.fib, hypertension who presented to ED for generalized weakness and hypocalcemia s/p parathyroidectomy.  #Hypocalcemia, resolving Patient calcium level was 5.2 on admission after low tolerance for po Vitamin D.. Patientt received 3 HD sessions while inpatient during which patient received calcium baths and IV Hectoral (Vitamin D). Patient calcium gradually improved through hospital stay. On discharge day patient calcium was 7.8.   --Hold calcitriol capsule,  --Tums 800mg  between meals and qHS per  nephrology  #Hyperkalemia, acute  Potassium was 6.0 on admission, greatly increase with HD sessions. Potassium was 5.2 on discharge day.  #Generalized Weakness, subacute Patient much better after three dialysis sessions, getting back his strength and continuing to improve. 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 Patient creatinine as high as 14.03 during hospitalization, improved with HD. Will resume home meds --Prednisone 10 mg po once daily  #Penile lesion, acute/ with scrotal calcium deposition Patient still experiencing severe pain secondary to a 2x2 non purulent lesion on the gland possibly due to avascular necrosis with patient long history of dialysis and calcium deposition. Pain control has not been appropriate patient in considerable pain and worried. Patient has also calcium deposition in his scrotum, with calcium stones easily express through the skin.   --Schedule follow up with [facility] post discharge  --Continue oxycodone-acetamipnophen 10-325 q4 PRN   #Paroxysmal Atrial Fibrillation. Chadsvasc1. Will continue current home regimen --Warfarin per pharmacy --Amiodarone 400mg  --Check INR --On telemetry  #Hypertension Patient slightly hypotensive this am --Continue to hold Metoprolol 25 mg  #Renal transplant, rejection --Continue prednisone 10mg    Issues for Follow Up:  1. Patient has a painful lesion on the gland of the penis near the meatus causing severe discomfort would need  urgent urologic follow up for further assessment.  Significant Procedures:  3 HD sessions during hospitalization  Significant Labs and Imaging:   Recent Labs Lab 06/01/16 0618 06/02/16 0732 06/04/16 0757  WBC 12.2* 13.7* 13.0*  HGB 14.3 12.3* 11.7*  HCT 46.1 38.8* 37.9*  PLT 365 343 335    Recent Labs Lab 05/31/16 1024  06/01/16 0432  06/01/16 1814 06/02/16 0452  06/02/16 0731 06/03/16 0513 06/04/16 0758  NA 134*  < >  137  < > 131* 132* 131* 134* 131*  K 6.0*  < > 5.0  < > 6.2* 5.7* 5.0 5.0 5.2*  CL 95*  < > 97*  < > 95* 93* 95* 95* 98*  CO2 21*  < > 24  < > 22 23 21* 24 19*  GLUCOSE 74  < > 69  < > 171* 112* 137* 138* 103*  BUN 35*  < > 16  < > 23* 28* 30* 17 27*  CREATININE 13.34*  < > 8.44*  < > 10.12* 10.81* 11.11* 7.56* 9.33*  CALCIUM 5.2*  < > 7.2*  < > 6.4* 6.4* 6.3* 7.5* 7.8*  MG 1.8  --   --   --   --   --   --   --   --   PHOS 5.7*  --  4.3  --   --   --  4.6 3.5 3.8  ALKPHOS 448*  --   --   --   --   --   --   --   --   AST 25  --   --   --   --   --   --   --   --   ALT 22  --   --   --   --   --   --   --   --   ALBUMIN 3.0*  --  3.0*  --   --   --  2.6* 2.6* 2.8*  < > = values in this interval not displayed.    Results/Tests Pending at Time of Discharge: None  Discharge Medications:    Medication List    STOP taking these medications        calcitRIOL 0.25 MCG capsule  Commonly known as:  ROCALTROL      TAKE these medications        amiodarone 400 MG tablet  Commonly known as:  PACERONE  Take 1 tablet (400 mg total) by mouth daily.     calcium acetate 667 MG capsule  Commonly known as:  PHOSLO  Take 667 mg by mouth 3 (three) times daily with meals.     calcium carbonate 500 MG chewable tablet  Commonly known as:  TUMS - dosed in mg elemental calcium  Chew 4 tablets by mouth 3 (three) times daily with meals.     HYDROcodone-acetaminophen 5-325 MG tablet  Commonly known as:  NORCO/VICODIN  Take 1 tablet by mouth every 6 (six) hours as needed for moderate pain.     lidocaine 2 % jelly  Commonly known as:  XYLOCAINE  Place 1 application into the urethra as needed (for pain).     metoprolol tartrate 25 MG tablet  Commonly known as:  LOPRESSOR  Take 1 tablet (25 mg total) by mouth 2 (two) times daily.     oxyCODONE 5 MG/5ML solution  Commonly known as:  ROXICODONE  Take 5 mg by mouth daily as needed for  breakthrough pain.     oxyCODONE-acetaminophen 5-325 MG tablet  Commonly known as:  PERCOCET/ROXICET  Take 2 tablets by mouth every 4 (four) hours as needed for severe pain.     predniSONE 10 MG tablet  Commonly known as:  DELTASONE  Take 10 mg by mouth daily.     warfarin 2 MG tablet  Commonly known as:  COUMADIN  Take 1 tablet by mouth daily or as directed by coumadin clinic  Discharge Instructions: Please refer to Patient Instructions section of EMR for full details.  Patient was counseled important signs and symptoms that should prompt return to medical care, changes in medications, dietary instructions, activity restrictions, and follow up appointments.   Follow-Up Appointments:     Follow-up Information    Schedule an appointment as soon as possible for a visit with DAUB, Stan Head, MD.   Specialty:  Family Medicine   Why:  as soon as possible for hospital follow up   Contact information:   [address] [place] [address] [phone]       Lovena Neighbours, MD 06/06/2016, 9:57 PM PGY-1, [facility]

## 2016-06-07 NOTE — Progress Notes (Addendum)
REVIEWED PT CHART AND CARDIOLOGIST NOTE 12-15-2015 (DR [place]ORDAN), STATE  PT EF 30-35%.  REVIEWED STRESS TEST AND CARDIAC CATH REPORT, PT EF 41%.  AND WITH PT'S MULTIPLE CO-MORBILITIES WILL REVIEW CHART WITH MDA.  SPOKE W/ DR Desmond LopeURK MDA TODAY, AND HE REVIEWED CHART IN EPIC AND EVEN THOUGH SMALL PROCEDURE PT WOULD BE BETTER CANDIDATE FOR MAIN OR DUE MULTIPLE MEDICAL ISSUES.  CALLED AND LM FOR SELITA , OR SCHEDULER FOR DR Ronne BinningMCKENZIE,  TELLING HER ABOUT DR Desmond LopeURK MDA DECISION.

## 2016-06-13 ENCOUNTER — Encounter (HOSPITAL_COMMUNITY): Admission: RE | Payer: Self-pay | Source: Ambulatory Visit

## 2016-06-13 ENCOUNTER — Ambulatory Visit (HOSPITAL_COMMUNITY): Admission: RE | Admit: 2016-06-13 | Payer: Medicare Other | Source: Ambulatory Visit | Admitting: Urology

## 2016-06-13 HISTORY — DX: Personal history of other diseases of the digestive system: Z87.19

## 2016-06-13 HISTORY — DX: Personal history of other endocrine, nutritional and metabolic disease: Z86.39

## 2016-06-13 HISTORY — DX: Personal history of other diseases of male genital organs: Z87.438

## 2016-06-13 HISTORY — DX: Chronic systolic (congestive) heart failure: I50.22

## 2016-06-13 HISTORY — DX: Anemia in chronic kidney disease: D63.1

## 2016-06-13 HISTORY — DX: Other cardiomyopathies: I42.8

## 2016-06-13 HISTORY — DX: Anemia in chronic kidney disease: N18.9

## 2016-06-13 HISTORY — DX: Atherosclerotic heart disease of native coronary artery without angina pectoris: I25.10

## 2016-06-13 SURGERY — EXPLORATION, SCROTUM
Anesthesia: General

## 2016-06-28 ENCOUNTER — Emergency Department (HOSPITAL_COMMUNITY): Payer: Medicare Other

## 2016-06-28 ENCOUNTER — Inpatient Hospital Stay (HOSPITAL_COMMUNITY)
Admission: EM | Admit: 2016-06-28 | Discharge: 2016-07-01 | DRG: 698 | Disposition: A | Discharge status: Home / Self Care | Payer: Medicare Other | Attending: Family Medicine | Admitting: Family Medicine

## 2016-06-28 ENCOUNTER — Encounter (HOSPITAL_COMMUNITY): Payer: Self-pay | Admitting: Emergency Medicine

## 2016-06-28 DIAGNOSIS — R109 Unspecified abdominal pain: Secondary | ICD-10-CM

## 2016-06-28 DIAGNOSIS — N186 End stage renal disease: Secondary | ICD-10-CM | POA: Diagnosis present

## 2016-06-28 DIAGNOSIS — Z981 Arthrodesis status: Secondary | ICD-10-CM

## 2016-06-28 DIAGNOSIS — Z79899 Other long term (current) drug therapy: Secondary | ICD-10-CM

## 2016-06-28 DIAGNOSIS — I422 Other hypertrophic cardiomyopathy: Secondary | ICD-10-CM | POA: Diagnosis present

## 2016-06-28 DIAGNOSIS — E8889 Other specified metabolic disorders: Secondary | ICD-10-CM | POA: Diagnosis present

## 2016-06-28 DIAGNOSIS — E162 Hypoglycemia, unspecified: Secondary | ICD-10-CM | POA: Diagnosis not present

## 2016-06-28 DIAGNOSIS — D631 Anemia in chronic kidney disease: Secondary | ICD-10-CM | POA: Diagnosis present

## 2016-06-28 DIAGNOSIS — T8611 Kidney transplant rejection: Secondary | ICD-10-CM

## 2016-06-28 DIAGNOSIS — Z6822 Body mass index (BMI) 22.0-22.9, adult: Secondary | ICD-10-CM

## 2016-06-28 DIAGNOSIS — Z992 Dependence on renal dialysis: Secondary | ICD-10-CM

## 2016-06-28 DIAGNOSIS — Y830 Surgical operation with transplant of whole organ as the cause of abnormal reaction of the patient, or of later complication, without mention of misadventure at the time of the procedure: Secondary | ICD-10-CM | POA: Diagnosis present

## 2016-06-28 DIAGNOSIS — Z7982 Long term (current) use of aspirin: Secondary | ICD-10-CM

## 2016-06-28 DIAGNOSIS — R509 Fever, unspecified: Secondary | ICD-10-CM

## 2016-06-28 DIAGNOSIS — R5383 Other fatigue: Secondary | ICD-10-CM | POA: Diagnosis not present

## 2016-06-28 DIAGNOSIS — L97419 Non-pressure chronic ulcer of right heel and midfoot with unspecified severity: Secondary | ICD-10-CM | POA: Diagnosis present

## 2016-06-28 DIAGNOSIS — R34 Anuria and oliguria: Secondary | ICD-10-CM | POA: Diagnosis present

## 2016-06-28 DIAGNOSIS — T8612 Kidney transplant failure: Secondary | ICD-10-CM

## 2016-06-28 DIAGNOSIS — I132 Hypertensive heart and chronic kidney disease with heart failure and with stage 5 chronic kidney disease, or end stage renal disease: Secondary | ICD-10-CM | POA: Diagnosis present

## 2016-06-28 DIAGNOSIS — I480 Paroxysmal atrial fibrillation: Secondary | ICD-10-CM | POA: Diagnosis present

## 2016-06-28 DIAGNOSIS — Z9115 Patient's noncompliance with renal dialysis: Secondary | ICD-10-CM

## 2016-06-28 DIAGNOSIS — Z87891 Personal history of nicotine dependence: Secondary | ICD-10-CM

## 2016-06-28 DIAGNOSIS — E440 Moderate protein-calorie malnutrition: Secondary | ICD-10-CM | POA: Diagnosis present

## 2016-06-28 DIAGNOSIS — E875 Hyperkalemia: Secondary | ICD-10-CM | POA: Diagnosis present

## 2016-06-28 DIAGNOSIS — Z7952 Long term (current) use of systemic steroids: Secondary | ICD-10-CM

## 2016-06-28 DIAGNOSIS — I5022 Chronic systolic (congestive) heart failure: Secondary | ICD-10-CM

## 2016-06-28 DIAGNOSIS — L97409 Non-pressure chronic ulcer of unspecified heel and midfoot with unspecified severity: Secondary | ICD-10-CM

## 2016-06-28 LAB — COMPREHENSIVE METABOLIC PANEL
ALT: 18 U/L (ref 17–63)
ANION GAP: 13 (ref 5–15)
AST: 28 U/L (ref 15–41)
Albumin: 2.5 g/dL — ABNORMAL LOW (ref 3.5–5.0)
Alkaline Phosphatase: 176 U/L — ABNORMAL HIGH (ref 38–126)
BILIRUBIN TOTAL: 1.3 mg/dL — AB (ref 0.3–1.2)
BUN: 51 mg/dL — ABNORMAL HIGH (ref 6–20)
CO2: 25 mmol/L (ref 22–32)
Calcium: 7 mg/dL — ABNORMAL LOW (ref 8.9–10.3)
Chloride: 99 mmol/L — ABNORMAL LOW (ref 101–111)
Creatinine, Ser: 11.51 mg/dL — ABNORMAL HIGH (ref 0.61–1.24)
GFR calc Af Amer: 5 mL/min — ABNORMAL LOW (ref >60)
GFR, EST NON AFRICAN AMERICAN: 4 mL/min — AB (ref >60)
Glucose, Bld: 64 mg/dL — ABNORMAL LOW (ref 65–99)
POTASSIUM: 6.6 mmol/L — AB (ref 3.5–5.1)
Sodium: 137 mmol/L (ref 135–145)
TOTAL PROTEIN: 7.7 g/dL (ref 6.5–8.1)

## 2016-06-28 LAB — CBC
HEMATOCRIT: 32.7 % — AB (ref 39.0–52.0)
HEMOGLOBIN: 10.3 g/dL — AB (ref 13.0–17.0)
MCH: 27.2 pg (ref 26.0–34.0)
MCHC: 31.5 g/dL (ref 30.0–36.0)
MCV: 86.3 fL (ref 78.0–100.0)
PLATELETS: 266 10*3/uL (ref 150–400)
RBC: 3.79 MIL/uL — ABNORMAL LOW (ref 4.22–5.81)
RDW: 19.1 % — AB (ref 11.5–15.5)
WBC: 17.1 10*3/uL — AB (ref 4.0–10.5)

## 2016-06-28 LAB — LIPASE, BLOOD: Lipase: 26 U/L (ref 11–51)

## 2016-06-28 MED ORDER — SODIUM BICARBONATE 8.4 % IV SOLN
50.0000 meq | Freq: Once | INTRAVENOUS | Status: AC
  Administered 2016-06-28: 50 meq via INTRAVENOUS
  Filled 2016-06-28: qty 50

## 2016-06-28 MED ORDER — SODIUM CHLORIDE 0.9 % IV SOLN
1.0000 g | Freq: Once | INTRAVENOUS | Status: DC
Start: 2016-06-28 — End: 2016-07-01
  Filled 2016-06-28: qty 10

## 2016-06-28 MED ORDER — HYDROCODONE-ACETAMINOPHEN 5-325 MG PO TABS
1.0000 | ORAL_TABLET | Freq: Four times a day (QID) | ORAL | Status: DC | PRN
  Administered 2016-06-28: 1 via ORAL

## 2016-06-28 MED ORDER — INSULIN ASPART 100 UNIT/ML IV SOLN
8.0000 [IU] | Freq: Once | INTRAVENOUS | Status: AC
  Administered 2016-06-28: 8 [IU] via INTRAVENOUS
  Filled 2016-06-28: qty 1

## 2016-06-28 MED ORDER — SODIUM CHLORIDE 0.9 % IV SOLN
1.0000 g | Freq: Once | INTRAVENOUS | Status: AC
  Administered 2016-06-28: 1 g via INTRAVENOUS
  Filled 2016-06-28: qty 10

## 2016-06-28 MED ORDER — DEXTROSE 50 % IV SOLN
1.0000 | Freq: Once | INTRAVENOUS | Status: AC
  Administered 2016-06-28: 50 mL via INTRAVENOUS
  Filled 2016-06-28: qty 50

## 2016-06-28 MED ORDER — DEXTROSE 50 % IV SOLN
INTRAVENOUS | Status: AC
  Administered 2016-06-28: 50 mL via INTRAVENOUS
  Filled 2016-06-28: qty 50

## 2016-06-28 MED ORDER — HYDROCODONE-ACETAMINOPHEN 5-325 MG PO TABS
ORAL_TABLET | ORAL | Status: AC
  Filled 2016-06-28: qty 1

## 2016-06-28 NOTE — ED Provider Notes (Signed)
[facility] DEPT Provider Note   CSN: [id number] Arrival date & time: 06/28/16  1331  First Provider Contact:  First MD Initiated Contact with Patient 06/28/16 1453     History   Chief Complaint Chief Complaint  Patient presents with  . Fatigue  . Emesis    HPI Samuel Rowland is a 55 y.o. male.  HPI 55 y.o. male with a hx of ESRD on Dialysis, HTN, Paroxsysmal Afib, Right Renal Transplant, Hypocalcemia s/p parathyroidectomy presents to the Emergency Department today complaining of N/V/D, generalized fatigue since DC from hospital on Friday. Admitted due to weakness and hypocalcemia. Improved with hospital stay, but since DC, has been unable to keep down calcitriol due to N/V. Diffuse abdominal pain with PO intake. States that it has been ongoing x 1 year. Pt with penile lesion likely due to hx of dialysis and calcium deposition as noted in previous admission. Pt with AFib on warfarin. Currently on prednisone due to renal transplant. No numbness/tingling. Pt Dialysis on T/R/S. Pt does produce urine, but only a little. Notes dysuria during those occurrences. Able to complete treatment on Saturday, but unable to attend today. No other symptoms noted.     Past Medical History:  Diagnosis Date  . Anemia, chronic renal failure   . Coronary artery disease cardiologist -- dr Martinique   mild minimal nonobstrucive CAD per cath 12-23-2015  . ESRD (end stage renal disease) on dialysis [facility])    "[facility] TTS since ~10/2014" (11/12/2015)  after failed kidney transplant 11/ 2010  . GERD (gastroesophageal reflux disease)   . Heart murmur   . History of acute prostatitis    11/ 2015  . History of esophagitis    2010  . History of hiatal hernia   . History of lower GI bleeding    2010 secondary to hemorrhoids  . History of metabolic acidosis    76/ 7341  . Hypertension   . Kidney transplant as cause of abnormal reaction or later complication    transplant done 11/ 2010 at [facility]  . Nausea and vomiting    chronic  . Nonischemic cardiomyopathy ([place])   . Paroxysmal atrial fibrillation with RVR [facility]) dx 12/ 2016  at [place]-  dr Martinique--  s/p  x2 cardioversions 12/ 2016  . Systolic CHF, chronic [facility])     Patient Active Problem List   Diagnosis Date Noted  . Hypocalcemia 05/31/2016  . Postsurgical hypoparathyroidism ([place]) 05/31/2016  . Penile ulcer 05/31/2016  . 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 Scrotal abscess 02/26/2016  . Sebaceous cyst of scrotum 02/26/2016  . Cellulitis, scrotum 02/22/2016  . Abnormal nuclear stress test 12/15/2015  . ESRD (end stage renal disease) ([place]) 12/15/2015  . Other hypertrophic cardiomyopathy ([place]) 11/26/2015  . Atrial fibrillation ([place]) [I48.91] 11/17/2015  . Long term (current) use of anticoagulants [Z79.01] 11/17/2015  . Paroxysmal atrial fibrillation with RVR ([place]) 11/12/2015  . Anemia of chronic kidney failure 11/12/2015  . 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 Acute kidney injury ([place])   . Essential hypertension   . Metabolic acidosis 93/79/0240  . Protein-calorie malnutrition, severe ([place]) 09/20/2014  . AKI (acute kidney injury) ([place]) 09/20/2014  . Renal transplant recipient 09/20/2014  . Prostatitis, acute 09/19/2014    Past Surgical History:  Procedure Laterality Date  . ANTERIOR CERVICAL DECOMP/DISCECTOMY FUSION  03/ 2000   C5-6   . BACK SURGERY    . CARDIAC CATHETERIZATION N/A 12/23/2015  Procedure: Left Heart Cath and Coronary Angiography;  Surgeon: Peter M Swaziland, MD;  Location: [facility] INVASIVE CV LAB;  Service: Cardiovascular;  Laterality: N/A;  abnormal nuclear test/  moderate single vessel CAD involving first diagonal (70%);  normal LV filling pressures  . CARDIOVASCULAR STRESS TEST  12-09-2015  dr Swaziland   Intermediate risk nuclear study w/ medium size, moderate intensity, partially reversible inferior defect consistent w/ probable thinning VS. small  prior infarct, minimal inferor ishcemia, LVEF moderately decreased ef 41% (30-44%) w/ global hypokinesis, severe LVE (findings likely suggest nonischemic cardiomyopathy  . CARDIOVERSION  x2   12/ 2016   one at [facility] and one at [facility] ED  . COLONOSCOPY W/ POLYPECTOMY  10/2009  . ESOPHAGOGASTRODUODENOSCOPY  10/2009  . KIDNEY TRANSPLANT Right 09/2009   "[facility]"  . RENAL BIOPSY  2015       Home Medications    Prior to Admission medications   Medication Sig Start Date End Date Taking? Authorizing Provider  aspirin 325 MG tablet Take 325 mg by mouth daily.   Yes Historical Provider, MD  calcitRIOL (ROCALTROL) 1 MCG/ML solution Take 1 mcg by mouth at bedtime. 06/24/16  Yes Historical Provider, MD  calcium acetate (PHOSLO) 667 MG capsule Take 667 mg by mouth 3 (three) times daily with meals.   Yes Historical Provider, MD  calcium carbonate (TUMS - DOSED IN MG ELEMENTAL CALCIUM) 500 MG chewable tablet Chew 4 tablets by mouth 3 (three) times daily with meals.   Yes Historical Provider, MD  HYDROcodone-acetaminophen (NORCO/VICODIN) 5-325 MG tablet Take 1 tablet by mouth every 6 (six) hours as needed for moderate pain. 02/26/16  Yes Nishant Dhungel, MD  lidocaine (XYLOCAINE) 2 % jelly Place 1 application into the urethra as needed (for pain).    Yes Historical Provider, MD  metoprolol tartrate (LOPRESSOR) 25 MG tablet Take 1 tablet (25 mg total) by mouth 2 (two) times daily. 11/13/15  Yes [name], DO  predniSONE (DELTASONE) 10 MG tablet Take 7 mg by mouth daily.    Yes Historical Provider, MD  amiodarone (PACERONE) 400 MG tablet Take 1 tablet (400 mg total) by mouth daily. Patient not taking: Reported on 06/28/2016 12/15/15   Peter M Swazil[place], MD  oxyCODONE-acetaminophen (PERCOCET/ROXICET) 5-325 MG tablet Take 2 tablets by mouth every 4 (four) hours as needed for severe pain. Patient not taking: Reported on 06/28/2016 05/28/16   Lester Kinsman Dowless, PA-C  warfarin (COUMADIN) 2 MG tablet Take 1  tablet by mouth daily or as directed by coumadin clinic Patient not taking: Reported on 06/28/2016 03/28/16   Peter M Swaziland, MD    Family History Family History  Problem Relation Age of Onset  . Hypertension Father     Social History Social History  Substance Use Topics  . Smoking status: Former Smoker    Years: 22.00    Types: Cigars  . Smokeless tobacco: Never Used     Comment: "stopped smoking in 1998"  . Alcohol use No     Allergies   Review of patient's allergies indicates no known allergies.   Review of Systems Review of Systems ROS reviewed and all are negative for acute change except as noted in the HPI.  Physical Exam Updated Vital Signs BP 154/94 (BP Location: Right Arm)   Pulse 82   Temp 100.2 F (37.9 C) (Oral)   Resp 20   Ht 6' (1.829 m)   Wt 76.5 kg   SpO2 100%   BMI 22.86 kg/m   Physical  Exam  Constitutional: He is oriented to person, place, and time. Vital signs are normal. He appears well-developed and well-nourished.  HENT:  Head: Normocephalic.  Right Ear: Hearing normal.  Left Ear: Hearing normal.  Eyes: Conjunctivae and EOM are normal. Pupils are equal, round, and reactive to light.  Neck: Normal range of motion. Neck supple.  Cardiovascular: Normal rate and regular rhythm.   Murmur heard. Pulmonary/Chest: Effort normal and breath sounds normal. No respiratory distress. He exhibits no tenderness.  Abdominal: Normal appearance and bowel sounds are normal. There is tenderness in the right upper quadrant, right lower quadrant, epigastric area and left upper quadrant.  Genitourinary:  Genitourinary Comments: Lesion noted on the distal end of the penis close to meatus. no drainage. Calcium deposition on scrotum  Musculoskeletal:  Left AV Fistula palpable with good thrill. No erythema or signs of infection.   Neurological: He is alert and oriented to person, place, and time.  Skin: Skin is warm and dry.  Diffuse calcium deposition noted on BLE    Psychiatric: He has a normal mood and affect. His speech is normal and behavior is normal. Thought content normal.   ED Treatments / Results  Labs (all labs ordered are listed, but only abnormal results are displayed) Labs Reviewed  COMPREHENSIVE METABOLIC PANEL - Abnormal; Notable for the following:       Result Value   Potassium 6.6 (*)    Chloride 99 (*)    Glucose, Bld 64 (*)    BUN 51 (*)    Creatinine, Ser 11.51 (*)    Calcium 7.0 (*)    Albumin 2.5 (*)    Alkaline Phosphatase 176 (*)    Total Bilirubin 1.3 (*)    GFR calc non Af Amer 4 (*)    GFR calc Af Amer 5 (*)    All other components within normal limits  CBC - Abnormal; Notable for the following:    WBC 17.1 (*)    RBC 3.79 (*)    Hemoglobin 10.3 (*)    HCT 32.7 (*)    RDW 19.1 (*)    All other components within normal limits  CULTURE, BLOOD (ROUTINE X 2)  CULTURE, BLOOD (ROUTINE X 2)  LIPASE, BLOOD  URINALYSIS, ROUTINE W REFLEX MICROSCOPIC (NOT AT [facility])    EKG  EKG Interpretation  Date/Time:  Tuesday June 28 2016 14:54:14 EDT Ventricular Rate:  82 PR Interval:    QRS Duration: 161 QT Interval:  465 QTC Calculation: 544 R Axis:   -66 Text Interpretation:  Sinus or ectopic atrial rhythm Prolonged PR interval Probable left atrial enlargement Nonspecific IVCD with LAD LVH with secondary repolarization abnormality Confirmed by Jeneen Rinks  MD, [place] ([id number]) on 06/28/2016 4:39:49 PM      Radiology Ct Abdomen Pelvis Wo Contrast  Result Date: 06/28/2016 CLINICAL DATA:  55 year old male with a history of weakness EXAM: CT ABDOMEN AND PELVIS WITHOUT CONTRAST TECHNIQUE: Multidetector CT imaging of the abdomen and pelvis was performed following the standard protocol without IV contrast. COMPARISON:  CT 07/05/2014 FINDINGS: Lower chest: Unremarkable appearance of the soft tissues of the chest wall. Cardiomegaly. No pericardial fluid. Calcifications in the distribution the right coronary artery. Re- demonstration of  calcified mediastinal lymph nodes. Unremarkable appearance of the distal esophagus. No hiatal hernia. No confluent airspace disease, pleural fluid, or pneumothorax within visualized lung. Abdomen/pelvis: Unremarkable appearance of liver and spleen. Unremarkable appearance of bilateral adrenal glands. No peripancreatic or pericholecystic fluid or inflammatory changes. Mixed density material air within  the gallbladder, potentially microlithiasis. No intrahepatic or extrahepatic biliary ductal dilatation. No intra-peritoneal free air or significant free-fluid. No abnormally dilated small bowel or colon. No transition point. No inflammatory changes of the mesenteries. Normal appendix identified. Fluid filled sigmoid colon and rectum. Right Kidney/Ureter: Native right kidney atrophic with thinning of the cortex and multiple partially calcified cysts. Left Kidney/Ureter: Left kidney atrophic with thinned cortex and multiple partially calcified cysts. 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 Compared to the prior CT there is now loss of definition of the renal hilar fat with infiltration of soft tissue/inflammation. Inflammatory changes surround the transplant kidney, with trace fluid. No evidence of hydronephrosis. Greatest diameter on the current CT measures 11.4 cm. Previously greatest diameter 10.2 cm. Urinary bladder relatively decompressed. Calcifications within the infrarenal aorta and of the mesenteric vessels. Calcifications of the renal arteries. Small vessels of the pelvis demonstrate dense calcifications. Calcifications of the iliac vessels and proximal femoral system. Musculoskeletal: No displaced fracture identified. No significant degenerative changes of the spine. IMPRESSION: Fluid filled colon, without associated inflammatory changes. Findings may represent enteritis/colitis, and correlation with patient history may be useful. The patient has a right pelvic transplant kidney. Since the  comparison CT of 07/05/2014, there has been development of significant inflammatory changes of the surrounding fat, as well as loss of the hilar fat with expansion of the kidney. Findings are nonspecific, and may represent pyelonephritis, chronic graft rejection, or potentially post-transplant lymphoproliferative disorder. Aortic atherosclerosis.  Small vessel atherosclerotic. These results were called by telephone at the time of interpretation on 06/28/2016 at 3:59 pm to the Physician Assistant caring for the patient, Mr. Shary Decamp , who verbally acknowledged these results. Signed, Dulcy Fanny. Earleen Newport, DO [facility] Radiology Electronically Signed   By: Corrie Mckusick D.O.   On: 06/28/2016 16:03    Procedures Procedures (including critical care time) CRITICAL CARE Performed by: Ozella Rocks   Total critical care time: 35 minutes  Critical care time was exclusive of separately billable procedures and treating other patients.  Critical care was necessary to treat or prevent imminent or life-threatening deterioration.  Critical care was time spent personally by me on the following activities: development of treatment plan with patient and/or surrogate as well as nursing, discussions with consultants, evaluation of patient's response to treatment, examination of patient, obtaining history from patient or surrogate, ordering and performing treatments and interventions, ordering and review of laboratory studies, ordering and review of radiographic studies, pulse oximetry and re-evaluation of patient's condition.  Medications Ordered in ED Medications  calcium chloride 1 g in sodium chloride 0.9 % 100 mL IVPB (1 g Intravenous New Bag/Given 06/28/16 1534)   Initial Impression / Assessment and Plan / ED Course  I have reviewed the triage vital signs and the nursing notes.  Pertinent labs & imaging results that were available during my care of the patient were  reviewed by me and considered in my medical decision making (see chart for details).  Clinical Course    Final Clinical Impressions(s) / ED Diagnoses  I have reviewed and evaluated the relevant laboratory valuesI have reviewed and evaluated the relevant imaging studies. I have interpreted the relevant EKG.I have reviewed the relevant previous healthcare records.I obtained HPI from historian. Patient discussed with supervising physician  ED Course:  Assessment: Pt is a 71yM with hxESRD on Dialysis T/R/S, HTN, Paroxsysmal Afib, Right Renal Transplant, Hypocalcemia s/p parathyroidectomy who presents with N/V/D x 1 week. Recent Admission due to  weakness and N/V and hypocalemia. DCed on Friday. Recurrent symptoms. Unable to attend Dialysis today, but did complete full treatment on Saturday. On exam, pt in NAD. Nontoxic/nonseptic appearing. VSS. Temp 100.30F. Lungs CTA. Heart RRR. Abdomen diffusely tender. CMP with hyperkalemia 6.6. Cr 11.51. EKG without acute abnormalities. Treated with calcium chloride 1g as well as bicarb 1 amp. WBC 17.1, but patient is on prednisone for renal transplant. CT Abdomen noncontrast showed inflammatory changes of right kidney. Possible pyelonephritis vs graft rejection? UA pending for possible infection. Plan is to Admit to family medicine. Consult to Nephrology placed for dialysis this evening.    Disposition/Plan:  Admit to Family Medicine  Supervising Physician Tanna Furry, MD   Final diagnoses:  Abdominal pain  Hyperkalemia  Hypocalcemia    New Prescriptions New Prescriptions   No medications on file     Shary Decamp, PA-C 06/28/16 1649    Tanna Furry, MD 07/05/16 1115

## 2016-06-28 NOTE — ED Notes (Signed)
Pt doesn't make urine  

## 2016-06-28 NOTE — Consult Note (Deleted)
Renal Service Consult Note [facility]  Samuel Rowland 06/28/2016 [name] D Requesting Physician:  Dr. Randolm IdolFletke  Reason for Consult:  ESRD pt with hyperkalemia HPI: The patient is a 55 y.o. year-old w hx of HTN, ESRD, failed renal Tx, NICM, PAF, syst CHF and CAD presented to ED with gen'd weakness, too weak to go to HD today.  In ED K 6.6 and initial EKG showed narrow QRS but repeat shows marked worsening/ wideining of QRS.  Has rec'd IV Ca and NaHCO3, have ordered IV glucose and insulin and plan for acute HD asap.    No CP, +abd pain, +n/v/d and fevers.  Has "calcium" problems at the tip of the penis and on his legs, they "took my neck glands out" for this.  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  Had PTX 6.28/17 and has struggled some w keeping CA levels up postop.    No prod cough, +fevers 1-2 days, + abd pain and N/V/D.  No joint pain or HA.  On HD x 2 yrs.     ROS  denies CP  no joint pain   no HA  no blurry vision  no rash  no diarrhea  no nausea/ vomiting  no dysuria  no difficulty voiding  no change in urine color    Past Medical History  Past Medical History:  Diagnosis Date  . Anemia, chronic renal failure   . Coronary artery disease cardiologist -- dr Swazilandjordan   mild minimal nonobstrucive CAD per cath 12-23-2015  . ESRD (end stage renal disease) on dialysis [facility](HCC)    [facility] TTS since ~10/2014" (11/12/2015)  after failed kidney transplant 11/ 2010  . GERD (gastroesophageal reflux disease)   . Heart murmur   . History of acute prostatitis    11/ 2015  . History of esophagitis    2010  . History of hiatal hernia   . History of lower GI bleeding    2010 secondary to hemorrhoids  . History of metabolic acidosis    11/ 2015  . Hypertension   . Kidney transplant as cause of abnormal reaction or later complication    [facility]  . Nausea and vomiting    chronic  . Nonischemic cardiomyopathy (HCC)   .  Paroxysmal atrial fibrillation with RVR [facility]) dx 12/ 2016  at [facility]   cardiologist-  dr Swazilandjordan--  s/p  x2 cardioversions 12/ 2016  . Systolic CHF, chronic (HCC)    Past Surgical History  Past Surgical History:  Procedure Laterality Date  . ANTERIOR CERVICAL DECOMP/DISCECTOMY FUSION  03/ 2000   C5-6   . BACK SURGERY    . CARDIAC CATHETERIZATION N/A 12/23/2015   Procedure: Left Heart Cath and Coronary Angiography;  Surgeon: Peter M SwazilandJordan, MD;  [facility];  Service: Cardiovascular;  Laterality: N/A;  abnormal nuclear test/  moderate single vessel CAD involving first diagonal (70%);  normal LV filling pressures  . CARDIOVASCULAR STRESS TEST  12-09-2015  dr Swazilandjordan   Intermediate risk nuclear study w/ medium size, moderate intensity, partially reversible inferior defect consistent w/ probable thinning VS. small prior infarct, minimal inferor ishcemia, LVEF moderately decreased ef 41% (30-44%) w/ global hypokinesis, severe LVE (findings likely suggest nonischemic cardiomyopathy  . CARDIOVERSION  x2   12/ 2016   [facility]  . COLONOSCOPY W/ POLYPECTOMY  10/2009  . ESOPHAGOGASTRODUODENOSCOPY  10/2009  . KIDNEY TRANSPLANT Right 09/2009   "  [facility]"  . RENAL BIOPSY  2015   Family History  Family History  Problem Relation Age of Onset  . Hypertension Father    Social History  reports that he has quit smoking. His smoking use included Cigars. He quit after 22.00 years of use. He has never used smokeless tobacco. He reports that he does not drink alcohol or use drugs. Allergies No Known Allergies Home medications Prior to Admission medications   Medication Sig Start Date End Date Taking? Authorizing Provider  aspirin 325 MG tablet Take 325 mg by mouth daily.   Yes Historical Provider, MD  calcitRIOL (ROCALTROL) 1 MCG/ML solution Take 1 mcg by mouth at bedtime. 06/24/16  Yes Historical Provider, MD  calcium acetate (PHOSLO) 667 MG capsule Take 667 mg by  mouth 3 (three) times daily with meals.   Yes Historical Provider, MD  calcium carbonate (TUMS - DOSED IN MG ELEMENTAL CALCIUM) 500 MG chewable tablet Chew 4 tablets by mouth 3 (three) times daily with meals.   Yes Historical Provider, MD  HYDROcodone-acetaminophen (NORCO/VICODIN) 5-325 MG tablet Take 1 tablet by mouth every 6 (six) hours as needed for moderate pain. 02/26/16  Yes Nishant Dhungel, MD  lidocaine (XYLOCAINE) 2 % jelly Place 1 application into the urethra as needed (for pain).    Yes Historical Provider, MD  metoprolol tartrate (LOPRESSOR) 25 MG tablet Take 1 tablet (25 mg total) by mouth 2 (two) times daily. 11/13/15  Yes [name], DO  predniSONE (DELTASONE) 10 MG tablet Take 7 mg by mouth daily.    Yes Historical Provider, MD  amiodarone (PACERONE) 400 MG tablet Take 1 tablet (400 mg total) by mouth daily. Patient not taking: Reported on 06/28/2016 12/15/15   Peter M Swaziland, MD  oxyCODONE-acetaminophen (PERCOCET/ROXICET) 5-325 MG tablet Take 2 tablets by mouth every 4 (four) hours as needed for severe pain. Patient not taking: Reported on 06/28/2016 05/28/16   Lester Kinsman Dowless, PA-C  warfarin (COUMADIN) 2 MG tablet Take 1 tablet by mouth daily or as directed by coumadin clinic Patient not taking: Reported on 06/28/2016 03/28/16   Peter M Swaziland, MD   Liver Function Tests  Recent Labs Lab 06/28/16 1409  AST 28  ALT 18  ALKPHOS 176*  BILITOT 1.3*  PROT 7.7  ALBUMIN 2.5*    Recent Labs Lab 06/28/16 1409  LIPASE 26   CBC  Recent Labs Lab 06/28/16 1409  WBC 17.1*  HGB 10.3*  HCT 32.7*  MCV 86.3  PLT 266   Basic Metabolic Panel  Recent Labs Lab 06/28/16 1409  NA 137  K 6.6*  CL 99*  CO2 25  GLUCOSE 64*  BUN 51*  CREATININE 11.51*  CALCIUM 7.0*   Iron/TIBC/Ferritin/ %Sat No results found for: IRON, TIBC, FERRITIN, IRONPCTSAT  Vitals:   06/28/16 1515 06/28/16 1545 06/28/16 1600 06/28/16 1730  BP: 148/94 120/80 118/76 150/89  Pulse: 81 87 87 90   Resp: 23 23 26 22   Temp:      TempSrc:      SpO2: 100% 96% 96% 96%  Weight:      Height:       Exam Gen gray, weak No rash, cyanosis or gangrene Sclera anicteric, throat clear  No jvd or bruits, healing scar from PTX Chest clear bilat RRR no MRG Abd soft ntnd no mass or ascites +bs GU has sloughing skin off of the glan penis MS R calf w purplish serpentine tender lesions Ext no LE edema / no wounds or ulcers  Neuro is alert, Ox 3 , nf    Dialysis: TTS [place]   L thigh AVG  Summary: 55 yo ESRD pt with several acute and subacute issues including hyperkalemia w EKG changes, fevers/ n/v/d/ abd pain, and what looks like calciphylaxis, sp recent PTX in late June.    Assessment: 1  Hyperkalemia - new EKG changes in ED 2  Fevers 3  Abd pain /vomiting/ diarrhea 4  Leg / penis lesions - prob calciphylaxis 5  MBD - sp recent PTX, serum Ca 7.0 on tums 4 tid and phoslo binder and Rocaltrol 1 ug hs 6  Hx failed renal Tx 7  HTN 8  PAF    Plan - HD asap tonight, get K down  Vinson Moselle MD [facility] pager [phone]    cell [phone] 06/28/2016, 5:41 PM

## 2016-06-28 NOTE — Procedures (Signed)
  I was present at this dialysis session, have reviewed the session itself and made  appropriate changes Vinson Moselleob Dejanee Thibeaux MD [facility] pager [phone]    cell [phone] 06/28/2016, 6:54 PM

## 2016-06-28 NOTE — ED Notes (Signed)
Placed patient into a gown and on the monitor 

## 2016-06-28 NOTE — H&P (Signed)
[facility] Admission History and Physical Service Pager: [phone]  Patient name: Samuel Rowland Medical record number: [id number] Date of birth: 23-Jan-1961 Age: 55 y.o. Gender: male  Primary Care Provider: Jenny Reichmann, MD Consultants: Nephrology Code Status: FULL  Chief Complaint: generalized weakness and n/v  Assessment and Plan: Samuel Rowland is a 55 y.o. male presenting with generalized weakness and n/v. PMH is significant for ESRD on Dialysis T/R/S, HTN, Paroxysmal Afib, Right Renal Transplant, Hypocalcemia s/p parathyroidectomy  Generalized Weakness with n/v, subacute Possible UTI/pyelonephritis vs colitis vs 2/2 to decreased po intake from multiple emesis episodes. Febrile to 101.42F in ED but afebrile at admission with WBC 17.1 that is chronically elevated d/t daily prednisone. Other vital signs are stable. CT abd/pelvis shows new significant inflammatory changes around R transplanted pelvic kidney and fluid filled colon w/out associated inflammatory changes. No acute abdominal pain, has had R sided mild abd pain since transplant in 2010 that is at his baseline and denies diarrhea. Colitis less likely given lack of diarrhea and no increase in abdominal pain. Patient is oliguric and therefore unable to give history of any UTI symptoms. Patient has also missed one HD session today which most likely exacerbated his condition.  --Emergent HD - Nephrology consulted, appreciate recommendations - monitor fever curve - IV phenergan prn -if began to have colitis symptoms could start treatment with cipro and flagyl  -given now afebrile and only mild increase in WBC will not pursue further work up for UTI at present  -blood cultures drawn in ED pending   Hypocalcemia, acute Patient underwent a parathyroidectomy on 6/28 with removal of 3 and 1/2 glands. Patient was started on calcium supplementation, but has never been able to tolerate it with multiple episodes of  emesis and nausea after taking pills. Hypocalcemia most likely 2/2 parathyroidectomy and inability to take medications. Calcium 7.0 on admit -Nephrology consulted for HD, appreciate assistance. Anticipate improvement with HD -calcium gluconate 1g IV and I amp bicarb given in ED --Follow up with am BMP - calcitriol capsule -Tums 866m between meals and qHS -continue home Phoslo  -check phosphorus level in AM  Hyperkalemia, acute  Potassium on admission was 6.6, most likely secondary to missed HD session. No peaked T waves noted on EKG. Patient is s/p Calcium Gluconate and Insulin injection.  -Emergent HD tonight -Check am BMP, Mag, phosphorus  -Morning EKG  ESRD Patient with a history of ESRD with transplant in 2010 and subsequent rejection. Patient has missed one HD session today. T/TH/Sat dialysis.  -nephrology for HD -monitor BMP -continue renal supplements   Penile lesion. 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 No antibiotics indicated. This needs to be treated as a burn with proper wound care. -- follow up with urology as outpatient --Norco 5-325 q6 PRN, lidocaine gel  Paroxysmal Atrial Fibrillation.  Chadsvasc1. 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 States has had nausea intermittently for the last 1.5  years but  generalized weakness has only been present since post parathyroidectomy. Has never been able to tolerate Ca pills post surgery due n/v. Has been hospitalized multiple times for similar complaints because states feels better with IV antiemetics but cannot seem to tolerate po medications and after leaving hospital will feel better for a few days before having n/v again. Discharged from [facility] on Friday 8/4. Over the last 3 days, has had worsening generalized weakness, cannot get out of bed and sleeping all day, then started with n/v on Saturday. Missed today's HD session. Has had subjective fevers but no chills. Denies cough, runny nose. No known sick contacts but has been repeatedly hospitalizeed in last month. Denies CP or SOB, hematemesis. Endorses R sided abdominal pain that is at his baseline, has had since kidney transplant in 2010. Does not make urine. Has continued penile pain from calcium, follows with urology at [place]: Per HPI otherwise the remainder of the systems were negative.  Patient Active Problem List   Diagnosis Date Noted  . Hyperkalemia 06/28/2016  . Hypocalcemia 05/31/2016  . Postsurgical hypoparathyroidism ([place]) 05/31/2016  . Penile ulcer 05/31/2016  . 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 Scrotal abscess 02/26/2016  . Sebaceous cyst of scrotum 02/26/2016  . Cellulitis, scrotum 02/22/2016  . Abnormal nuclear stress test 12/15/2015  . ESRD (end stage renal disease) ([place]) 12/15/2015  . Other hypertrophic cardiomyopathy ([place]) 11/26/2015  . Atrial fibrillation ([place]) [I48.91] 11/17/2015  . Long term (current) use of anticoagulants [Z79.01] 11/17/2015  . Paroxysmal atrial fibrillation with RVR ([place]) 11/12/2015  . Anemia of chronic kidney failure 11/12/2015  . 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 Acute kidney injury ([place])   . Essential hypertension   . Metabolic acidosis 62/01/5596  . Protein-calorie  malnutrition, severe ([place]) 09/20/2014  . AKI (acute kidney injury) ([place]) 09/20/2014  . Renal transplant recipient 09/20/2014  . Prostatitis, acute 09/19/2014    Past Medical History: Past Medical History:  Diagnosis Date  . Anemia, chronic renal failure   . Coronary artery disease cardiologist -- dr Martinique   mild minimal nonobstrucive CAD per cath 12-23-2015  . ESRD (end stage renal disease) on dialysis [facility])    "[facility]; TTS since ~10/2014" (11/12/2015)  after failed kidney transplant 11/ 2010  . GERD (gastroesophageal reflux disease)   . Heart murmur   . History of acute prostatitis    11/ 2015  . History of esophagitis    2010  . History of hiatal hernia   . History of lower GI bleeding    2010 secondary to hemorrhoids  . History of metabolic acidosis    41/ 6384  . Hypertension   . Kidney transplant as cause of abnormal reaction or later complication    trans[facility] done 11/ [facility] at [facility]  . Nausea and vomiting    chronic  . Nonischemic cardiomyopathy ([place])   . Paroxysmal atrial fibrillation with RVR [facility]) dx 12/ 2016  at [place]-  dr Martinique--  s/p  x2 cardioversions 12/ 2016  . Systolic CHF, chronic ([place])     Past Surgical History: Past Surgical History:  Procedure Laterality Date  . ANTERIOR CERVICAL DECOMP/DISCECTOMY FUSION  03/ 2000   C5-6   . BACK SURGERY    . CARDIAC CATHETERIZATION N/A 12/23/2015   Procedure: Left Heart Cath and Coronary Angiography;  Surgeon: Peter M Martinique, MD;  Location: [place] CV LAB;  Service: Cardiovascular;  Laterality: N/A;  abnormal nuclear test/  moderate single vessel CAD involving first diagonal (70%);  normal LV filling pressures  . CARDIOVASCULAR STRESS TEST  12-09-2015  dr Martinique   Intermediate risk nuclear study w/ medium size, moderate intensity, partially reversible inferior defect consistent w/ probable thinning VS. small prior infarct, minimal inferor ishcemia, LVEF moderately decreased ef  41% (30-44%) w/ global hypokinesis, severe LVE (findings likely suggest nonischemic cardiomyopathy  . CARDIOVERSION  x2   12/ 2016   one at [facility] and one at [facility] ED  . COLONOSCOPY W/ POLYPECTOMY  10/2009  . ESOPHAGOGASTRODUODENOSCOPY  10/2009  . KIDNEY TRANSPLANT Right 09/2009   "[facility]"  . RENAL BIOPSY  2015    Social History: Social History  Substance Use Topics  . Smoking status: Former Smoker    Years: 22.00    Types: Cigars  . Smokeless tobacco: Never Used     Comment: "stopped smoking in 1998"  . Alcohol use No   Additional social history: none  Family History: Family History  Problem Relation Age of Onset  . Hypertension Father     Allergies and Medications: No Known Allergies No current facility-administered medications on file prior to encounter.    Current Outpatient Prescriptions on File Prior to Encounter  Medication Sig Dispense Refill  . calcium acetate (PHOSLO) 667 MG capsule Take 667 mg by mouth 3 (three) times daily with meals.    . calcium carbonate (TUMS - DOSED IN MG ELEMENTAL CALCIUM) 500 MG chewable tablet Chew 4 tablets by mouth 3 (three) times daily with meals.    Marland Kitchen HYDROcodone-acetaminophen (NORCO/VICODIN) 5-325 MG tablet Take 1 tablet by mouth every 6 (six) hours as needed for moderate pain. 20 tablet 0  . lidocaine (XYLOCAINE) 2 % jelly Place 1 application into the urethra as needed (for pain).     . metoprolol tartrate (LOPRESSOR) 25 MG tablet Take 1 tablet (25 mg total) by mouth 2 (two) times daily. 60 tablet 0  . predniSONE (DELTASONE) 10 MG tablet Take 7 mg by mouth daily.     Marland Kitchen amiodarone (PACERONE) 400 MG tablet Take 1 tablet (400 mg total) by mouth daily. (Patient not taking: Reported on 06/28/2016) 30 tablet 11  . oxyCODONE-acetaminophen (PERCOCET/ROXICET) 5-325 MG tablet Take 2 tablets by mouth every 4 (four) hours as needed for severe pain. (Patient not taking: Reported on 06/28/2016) 15 tablet 0  . warfarin (COUMADIN) 2 MG tablet  Take 1 tablet by mouth daily or as directed by coumadin clinic (Patient not taking: Reported on 06/28/2016) 30 tablet 1    Objective: BP 118/76   Pulse 87   Temp 100.2 F (37.9 C) (Oral)   Resp 26   Ht 6' (1.829 m)   Wt 168 lb 9 oz (76.5 kg)   SpO2 96%   BMI 22.86 kg/m  Exam: General: Lying in bed, in NAD Eyes: EOMI Neck: Scar present from recent parathyroidectomy.  Cardiovascular: RRR, refer murmur from fistula, no gallops, rubs. Strong pulses. Fistula with small bruise overlying but no erythema or increased warmth to touch.  Respiratory: CTAB, normal effort on room air Abdomen: RLQ pain upon palpation, nd, soft, + bs. No guarding MSK: moving limbs spontaneously Skin: calcium deposits visible on legs Neuro: A&Ox3, strength 5/5 throughout. No focal deficits. Psych: appropriate affect  Labs and Imaging: CBC BMET   Recent Labs Lab 06/28/16 1409  WBC 17.1*  HGB 10.3*  HCT 32.7*  PLT 266    Recent Labs Lab  06/28/16 1409  NA 137  K 6.6*  CL 99*  CO2 25  BUN 51*  CREATININE 11.51*  GLUCOSE 64*  CALCIUM 7.0*      Ct Abdomen Pelvis Wo Contrast  Result Date: 06/28/2016 CLINICAL DATA:  55 year old male with a history of weakness EXAM: CT ABDOMEN AND PELVIS WITHOUT CONTRAST TECHNIQUE: Multidetector CT imaging of the abdomen and pelvis was performed following the standard protocol without IV contrast. COMPARISON:  CT 07/05/2014 FINDINGS: Lower chest: Unremarkable appearance of the soft tissues of the chest wall. Cardiomegaly. No pericardial fluid. Calcifications in the distribution the right coronary artery. Re- demonstration of calcified mediastinal lymph nodes. Unremarkable appearance of the distal esophagus. No hiatal hernia. No confluent airspace disease, pleural fluid, or pneumothorax within visualized lung. Abdomen/pelvis: Unremarkable appearance of liver and spleen. Unremarkable appearance of bilateral adrenal glands. No peripancreatic or pericholecystic fluid or  inflammatory changes. Mixed density material air within the gallbladder, potentially microlithiasis. No intrahepatic or extrahepatic biliary ductal dilatation. No intra-peritoneal free air or significant free-fluid. No abnormally dilated small bowel or colon. No transition point. No inflammatory changes of the mesenteries. Normal appendix identified. Fluid filled sigmoid colon and rectum. Right Kidney/Ureter: Native right kidney atrophic with thinning of the cortex and multiple partially calcified cysts. Left Kidney/Ureter: Left kidney atrophic with thinned cortex and multiple partially calcified cysts. 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 Compared to the prior CT there is now loss of definition of the renal hilar fat with infiltration of soft tissue/inflammation. Inflammatory changes surround the transplant kidney, with trace fluid. No evidence of hydronephrosis. Greatest diameter on the current CT measures 11.4 cm. Previously greatest diameter 10.2 cm. Urinary bladder relatively decompressed. Calcifications within the infrarenal aorta and of the mesenteric vessels. Calcifications of the renal arteries. Small vessels of the pelvis demonstrate dense calcifications. Calcifications of the iliac vessels and proximal femoral system. Musculoskeletal: No displaced fracture identified. No significant degenerative changes of the spine. IMPRESSION: Fluid filled colon, without associated inflammatory changes. Findings may represent enteritis/colitis, and correlation with patient history may be useful. The patient has a right pelvic transplant kidney. Since the comparison CT of 07/05/2014, there has been development of significant inflammatory changes of the surrounding fat, as well as loss of the hilar fat with expansion of the kidney. Findings are nonspecific, and may represent pyelonephritis, chronic graft rejection, or potentially post-transplant lymphoproliferative disorder. Aortic atherosclerosis.  Small  vessel atherosclerotic. These results were called by telephone at the time of interpretation on 06/28/2016 at 3:59 pm to the Physician Assistant caring for the patient, Mr. Shary Decamp , who verbally acknowledged these results. Signed, Dulcy Fanny. Earleen Newport, DO [facility] Radiology Electronically Signed   By: Corrie Mckusick D.O.   On: 06/28/2016 16:03    Bufford Lope, DO 06/28/2016, 5:35 PM PGY-1, [place] Intern pager: [phone], text pages welcome  Upper Level Addendum:  I have seen and evaluated this patient along with Dr. Shawna Orleans and reviewed the above note, making necessary revisions in green.   Phill Myron, D.O. 06/28/2016, 9:12 PM PGY-2, [place]

## 2016-06-28 NOTE — ED Notes (Signed)
Admitting at bedside 

## 2016-06-29 ENCOUNTER — Inpatient Hospital Stay (HOSPITAL_COMMUNITY): Payer: Medicare Other

## 2016-06-29 DIAGNOSIS — Z87891 Personal history of nicotine dependence: Secondary | ICD-10-CM | POA: Diagnosis not present

## 2016-06-29 DIAGNOSIS — E875 Hyperkalemia: Secondary | ICD-10-CM

## 2016-06-29 DIAGNOSIS — I480 Paroxysmal atrial fibrillation: Secondary | ICD-10-CM | POA: Diagnosis present

## 2016-06-29 DIAGNOSIS — I5022 Chronic systolic (congestive) heart failure: Secondary | ICD-10-CM

## 2016-06-29 DIAGNOSIS — L97419 Non-pressure chronic ulcer of right heel and midfoot with unspecified severity: Secondary | ICD-10-CM

## 2016-06-29 DIAGNOSIS — Z7952 Long term (current) use of systemic steroids: Secondary | ICD-10-CM | POA: Diagnosis not present

## 2016-06-29 DIAGNOSIS — Y830 Surgical operation with transplant of whole organ as the cause of abnormal reaction of the patient, or of later complication, without mention of misadventure at the time of the procedure: Secondary | ICD-10-CM | POA: Diagnosis present

## 2016-06-29 DIAGNOSIS — Z9115 Patient's noncompliance with renal dialysis: Secondary | ICD-10-CM | POA: Diagnosis not present

## 2016-06-29 DIAGNOSIS — N485 Ulcer of penis: Secondary | ICD-10-CM

## 2016-06-29 DIAGNOSIS — R109 Unspecified abdominal pain: Secondary | ICD-10-CM

## 2016-06-29 DIAGNOSIS — Z981 Arthrodesis status: Secondary | ICD-10-CM | POA: Diagnosis not present

## 2016-06-29 DIAGNOSIS — R5383 Other fatigue: Secondary | ICD-10-CM | POA: Diagnosis present

## 2016-06-29 DIAGNOSIS — N186 End stage renal disease: Secondary | ICD-10-CM | POA: Diagnosis present

## 2016-06-29 DIAGNOSIS — R1084 Generalized abdominal pain: Secondary | ICD-10-CM

## 2016-06-29 DIAGNOSIS — D631 Anemia in chronic kidney disease: Secondary | ICD-10-CM | POA: Diagnosis present

## 2016-06-29 DIAGNOSIS — Z992 Dependence on renal dialysis: Secondary | ICD-10-CM | POA: Diagnosis not present

## 2016-06-29 DIAGNOSIS — E8889 Other specified metabolic disorders: Secondary | ICD-10-CM | POA: Diagnosis present

## 2016-06-29 DIAGNOSIS — T8611 Kidney transplant rejection: Secondary | ICD-10-CM | POA: Diagnosis present

## 2016-06-29 DIAGNOSIS — Z6822 Body mass index (BMI) 22.0-22.9, adult: Secondary | ICD-10-CM | POA: Diagnosis not present

## 2016-06-29 DIAGNOSIS — I132 Hypertensive heart and chronic kidney disease with heart failure and with stage 5 chronic kidney disease, or end stage renal disease: Secondary | ICD-10-CM | POA: Diagnosis present

## 2016-06-29 DIAGNOSIS — E440 Moderate protein-calorie malnutrition: Secondary | ICD-10-CM | POA: Insufficient documentation

## 2016-06-29 DIAGNOSIS — I422 Other hypertrophic cardiomyopathy: Secondary | ICD-10-CM | POA: Diagnosis present

## 2016-06-29 DIAGNOSIS — E162 Hypoglycemia, unspecified: Secondary | ICD-10-CM | POA: Diagnosis not present

## 2016-06-29 DIAGNOSIS — Z79899 Other long term (current) drug therapy: Secondary | ICD-10-CM | POA: Diagnosis not present

## 2016-06-29 DIAGNOSIS — R34 Anuria and oliguria: Secondary | ICD-10-CM | POA: Diagnosis present

## 2016-06-29 DIAGNOSIS — L97409 Non-pressure chronic ulcer of unspecified heel and midfoot with unspecified severity: Secondary | ICD-10-CM

## 2016-06-29 DIAGNOSIS — Z7982 Long term (current) use of aspirin: Secondary | ICD-10-CM | POA: Diagnosis not present

## 2016-06-29 LAB — CBC
HCT: 29 % — ABNORMAL LOW (ref 39.0–52.0)
HEMOGLOBIN: 9 g/dL — AB (ref 13.0–17.0)
MCH: 26.1 pg (ref 26.0–34.0)
MCHC: 31 g/dL (ref 30.0–36.0)
MCV: 84.1 fL (ref 78.0–100.0)
Platelets: 223 10*3/uL (ref 150–400)
RBC: 3.45 MIL/uL — AB (ref 4.22–5.81)
RDW: 19 % — ABNORMAL HIGH (ref 11.5–15.5)
WBC: 10.5 10*3/uL (ref 4.0–10.5)

## 2016-06-29 LAB — BASIC METABOLIC PANEL
ANION GAP: 11 (ref 5–15)
BUN: 17 mg/dL (ref 6–20)
CALCIUM: 6.7 mg/dL — AB (ref 8.9–10.3)
CO2: 30 mmol/L (ref 22–32)
Chloride: 95 mmol/L — ABNORMAL LOW (ref 101–111)
Creatinine, Ser: 5.82 mg/dL — ABNORMAL HIGH (ref 0.61–1.24)
GFR, EST AFRICAN AMERICAN: 11 mL/min — AB (ref >60)
GFR, EST NON AFRICAN AMERICAN: 10 mL/min — AB (ref >60)
Glucose, Bld: 61 mg/dL — ABNORMAL LOW (ref 65–99)
Potassium: 4 mmol/L (ref 3.5–5.1)
SODIUM: 136 mmol/L (ref 135–145)

## 2016-06-29 LAB — GLUCOSE, CAPILLARY
GLUCOSE-CAPILLARY: 122 mg/dL — AB (ref 65–99)
GLUCOSE-CAPILLARY: 59 mg/dL — AB (ref 65–99)
GLUCOSE-CAPILLARY: 80 mg/dL (ref 65–99)
Glucose-Capillary: 29 mg/dL — CL (ref 65–99)

## 2016-06-29 LAB — PHOSPHORUS: PHOSPHORUS: 2.2 mg/dL — AB (ref 2.5–4.6)

## 2016-06-29 LAB — MAGNESIUM: MAGNESIUM: 1.7 mg/dL (ref 1.7–2.4)

## 2016-06-29 MED ORDER — HEPARIN SODIUM (PORCINE) 5000 UNIT/ML IJ SOLN
5000.0000 [IU] | Freq: Three times a day (TID) | INTRAMUSCULAR | Status: DC
  Administered 2016-06-30 – 2016-07-01 (×3): 5000 [IU] via SUBCUTANEOUS
  Filled 2016-06-29 (×2): qty 1

## 2016-06-29 MED ORDER — PREDNISONE 20 MG PO TABS
40.0000 mg | ORAL_TABLET | Freq: Every day | ORAL | Status: DC
  Administered 2016-06-30 – 2016-07-01 (×2): 40 mg via ORAL
  Filled 2016-06-29 (×2): qty 2

## 2016-06-29 MED ORDER — AMIODARONE HCL 200 MG PO TABS
200.0000 mg | ORAL_TABLET | Freq: Every day | ORAL | Status: DC
  Administered 2016-06-29 – 2016-07-01 (×3): 200 mg via ORAL
  Filled 2016-06-29 (×3): qty 1

## 2016-06-29 MED ORDER — METHYLPREDNISOLONE SODIUM SUCC 125 MG IJ SOLR
125.0000 mg | Freq: Once | INTRAMUSCULAR | Status: AC
  Administered 2016-06-29: 125 mg via INTRAVENOUS
  Filled 2016-06-29: qty 2

## 2016-06-29 MED ORDER — PROMETHAZINE HCL 25 MG/ML IJ SOLN
12.5000 mg | Freq: Four times a day (QID) | INTRAMUSCULAR | Status: DC | PRN
  Administered 2016-06-29 – 2016-07-01 (×3): 12.5 mg via INTRAVENOUS
  Filled 2016-06-29 (×4): qty 1

## 2016-06-29 MED ORDER — DEXTROSE 50 % IV SOLN
50.0000 mL | Freq: Once | INTRAVENOUS | Status: AC
  Administered 2016-06-28: 50 mL via INTRAVENOUS

## 2016-06-29 MED ORDER — CALCITRIOL 0.5 MCG PO CAPS
1.0000 ug | ORAL_CAPSULE | Freq: Every day | ORAL | Status: DC
  Administered 2016-06-29 – 2016-06-30 (×3): 1 ug via ORAL
  Filled 2016-06-29 (×3): qty 2

## 2016-06-29 MED ORDER — LIDOCAINE HCL 2 % EX GEL: 1.0000 "application " | CUTANEOUS | Status: DC | PRN

## 2016-06-29 MED ORDER — ENSURE ENLIVE PO LIQD: 237.0000 mL | Freq: Two times a day (BID) | ORAL | Status: DC

## 2016-06-29 MED ORDER — ASPIRIN 325 MG PO TABS
325.0000 mg | ORAL_TABLET | Freq: Every day | ORAL | Status: DC
Start: 2016-06-29 — End: 2016-07-01
  Administered 2016-06-29 – 2016-07-01 (×3): 325 mg via ORAL
  Filled 2016-06-29 (×3): qty 1

## 2016-06-29 MED ORDER — LIDOCAINE HCL 2 % EX GEL
1.0000 "application " | Freq: Two times a day (BID) | CUTANEOUS | Status: DC
  Administered 2016-06-29 – 2016-07-01 (×5): 1 via URETHRAL
  Filled 2016-06-29 (×3): qty 5

## 2016-06-29 MED ORDER — HYDROCODONE-ACETAMINOPHEN 7.5-325 MG/15ML PO SOLN
10.0000 mL | Freq: Four times a day (QID) | ORAL | Status: DC | PRN
  Administered 2016-06-29 – 2016-06-30 (×5): 10 mL via ORAL
  Filled 2016-06-29 (×6): qty 15

## 2016-06-29 MED ORDER — ENOXAPARIN SODIUM 30 MG/0.3ML ~~LOC~~ SOLN
30.0000 mg | Freq: Every day | SUBCUTANEOUS | Status: DC
  Administered 2016-06-29: 30 mg via SUBCUTANEOUS
  Filled 2016-06-29: qty 0.3

## 2016-06-29 MED ORDER — NEPRO/CARBSTEADY PO LIQD
237.0000 mL | Freq: Two times a day (BID) | ORAL | Status: DC
  Administered 2016-06-30: 237 mL via ORAL

## 2016-06-29 MED ORDER — METOPROLOL TARTRATE 25 MG PO TABS
25.0000 mg | ORAL_TABLET | Freq: Two times a day (BID) | ORAL | Status: DC
  Administered 2016-06-29 – 2016-07-01 (×4): 25 mg via ORAL
  Filled 2016-06-29 (×5): qty 1

## 2016-06-29 MED ORDER — CALCIUM ACETATE (PHOS BINDER) 667 MG PO CAPS
667.0000 mg | ORAL_CAPSULE | Freq: Three times a day (TID) | ORAL | Status: DC
Start: 2016-06-29 — End: 2016-07-01
  Administered 2016-06-29 – 2016-07-01 (×6): 667 mg via ORAL
  Filled 2016-06-29 (×6): qty 1

## 2016-06-29 MED ORDER — CALCIUM CARBONATE ANTACID 500 MG PO CHEW
4.0000 | CHEWABLE_TABLET | Freq: Three times a day (TID) | ORAL | Status: DC
  Administered 2016-06-29 – 2016-07-01 (×6): 800 mg via ORAL
  Filled 2016-06-29 (×6): qty 4

## 2016-06-29 MED ORDER — PREDNISONE 5 MG PO TABS
7.0000 mg | ORAL_TABLET | Freq: Every day | ORAL | Status: DC
  Administered 2016-06-29: 7 mg via ORAL
  Filled 2016-06-29: qty 2

## 2016-06-29 NOTE — Progress Notes (Signed)
Delivered AD and explained it to patient. He will contact us when he has completed it or if he has further questions. Irma Delancey, Chaplain

## 2016-06-29 NOTE — Consult Note (Addendum)
WOC Nurse wound consult note Reason for Consult: Consult requested for penis and right heel wounds.  Pt states he has [facility] wound, which is related to calciphylaxis. Wound type: Pt has to retract his foreskin to display the wound; 1X1X.1cm to anterior penis, white moist wound bed, no odor or drainage.  He states it has improved with the use of xeroform. He has a few other locations related to calciphylaxis; left upper thigh with patchy area of dry raised scabbed lesions; approx 5x5cm, no open wound, odor, drainage or fluctuance at this time. Right heel .2X.2cm dry brown scab, no open wound, odor, drainage or fluctuance.  This is NOT a pressure injury. Right posterior leg; 4X3cm and 3X3cm; darker-colored skin, hard to palpation, no open wound, odor, drainage or fluctuance.  Dressing procedure/placement/frequency: Continue present plan of care which was ordered by the [facility]rology department at [facility]; topical Lidocaine 2% jelly applied to reduce pain to penis BID, then  xeroform gauze to the site. He can resume followup with the urology department after discharge. It is best practice to leave dry stable calciphylaxis intact to the leg and heel areas, no topical treatment indicated at this time, float heel to reduce pressure. Please re-consult if further assistance is needed.  Thank-you,  Cammie Mcgeeawn Retha Bither MSN, RN, CWOCN, [place]CN-AP, CNS [phone]

## 2016-06-29 NOTE — Progress Notes (Signed)
Pharmacy consulted for heparin dosing for VTE prophylaxis in setting of ESRD.  Will begin heparin 5000 units subq TID on 8/10 as received Lovenox 30 mg earlier today. Will s/o; please re consult if needed.    Pollyann SamplesAndy Jesse Hirst, PharmD, BCPS 06/29/2016, 3:36 PM Pager: [phone]

## 2016-06-29 NOTE — Progress Notes (Addendum)
Patient admitted under Observation status.  Will arrange for dialysis.  If patient status changes to Inpatient status please notify us and we will do a formal consultation.   [facility] [place]   TTS     4h  Hep none  170 lbs  LUA AVF  Vinson Moselleob Allean Montfort MD [facility] pager [phone]    cell [phone] 05/28/2016, 11:45 AM

## 2016-06-29 NOTE — Progress Notes (Signed)
Family Medicine Teaching Service Daily Progress Note Intern Pager: [phone]  Patient name: Keval Nam Medical record number: [id number] Date of birth: 08/21/1961 Age: 55 y.o. Gender: male  Primary Care Provider: Lucilla Edin, MD Consultants: Nephro Code Status: Full  Pt Overview and Major Events to Date:  Admitted to hospital on 06/28/16  Assessment and Plan: Marisa Hufstetler is a 55 y.o. male presenting with generalized weakness and n/v. PMH is significant for ESRD on Dialysis T/R/S, HTN, Paroxysmal Afib, Right Renal Transplant, Hypocalcemia s/p parathyroidectomy  Generalized Weakness with n/v, subacute Hypotensive this am to 98/64. Possible UTI/pyelonephritis vs 2/2 to decreased po intake from multiple emesis episodes. Febrile to 101.38F in ED but afebrile at admission with WBC 17.1 that is chronically elevated d/t daily prednisone. CT abd/pelvis shows new significant inflammatory changes around R transplanted pelvic kidney and fluid filled colon w/out associated inflammatory changes. Patient is oliguric and therefore unable to give history of any UTI symptoms.  - Nephrology consulted, appreciate recommendations - Ensure for decreased po intake - monitor fever curve - IV phenergan prn -given now afebrile and only mild increase in WBC will not pursue further work up for UTI at present  -blood cultures drawn in ED pending  - will obtain OSH recs  Hypocalcemia, subacute Patient underwent a parathyroidectomy on 6/28 with removal of 3 and 1/2 glands. Patient was started on calcium supplementation, but has never been able to tolerate it with multiple episodes of emesis and nausea after taking pills. Hypocalcemia most likely 2/2 parathyroidectomy and inability to take medications. Calcium 7.0 on admit. HD on 06/28/16 w/corrected Ca of 7.5. Phosphorus low to 2.2.  -Nephrology consulted for HD, appreciate assistance. -calcium gluconate 1g IV and I amp bicarb given in ED -Follow BMP - calcitriol  capsule -Tums  between meals and qHS -continue home Phoslo  -check phosphorus level in AM  Hypoglycemia, acute Asymptomatic. Blood sugar of 59 this morning. Likely due to decreased po intake. No history of diabetes. - encourage oral intake  Hyperkalemia, resolved  Potassium now 4.0 s/p HD. Potassium on admission was 6.6, most likely secondary to missed HD session. No peaked T waves noted on EKG. Patient is s/p Calcium Gluconate and Insulin injection.  -Monitor BMP, Mag, phosphorus   ESRD Patient with a history of ESRD with transplant in 2010 and subsequent rejection. T/TH/Sat dialysis.  -monitor BMP -continue renal supplements   Penile lesion. Patient sees urology at [facility]. 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 No antibiotics indicated. This needs to be treated as a burn with proper wound care. - follow up with urology as outpatient - Norco 5-325 q6 PRN, lidocaine gel - wound c/s, appreciate recs  RLE wound, chronic: Patient has posterior calf that is possibly compression ulcer related to several recent hospitalizations.  - wound c/s, appreciate recs  Paroxysmal Atrial Fibrillation. Chadsvasc1. 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 Well controlled with home regimen - Metoprolol 25 mg - Continue to monitor  H/o Renal transplant, rejection CT abd/pelvis shows new significant inflammatory changes around R transplanted pelvic kidney compared to CT abd/pelvis in 2015 - Continue prednisone    FEN/GI: renal diet PPx: Lovenox  Disposition: pending  Subjective:  Pt reports minimal change in pain since dialysis last night. Reports no dizziness or shakiness related to overnight hypoglycemia. Is forcing himself to eat breakfast despite  lack of appetite. Nausea improved with IV meds.    Objective: Temp:  [98.1 F (36.7 C)-101.7 F (38.7 C)] 98.6 F (37 C) (08/09 0627) Pulse Rate:  [64-90] 68 (08/09 0627) Resp:  [14-26] 19 (08/09 0627) BP: (93-178)/(51-95) 98/64 (08/09 0627) SpO2:  [96 %-100 %] 97 % (08/09 0627) Weight:  [74 kg (163 lb 3.2 oz)-76.5 kg (168 lb 9 oz)] 74 kg (163 lb 3.2 oz) (08/08 2320) Physical Exam: General: Propped up in bed, eating breakfast, NAD Cardiovascular: RRR, 2/6 holosystolic murmur, no gallops or rubs Respiratory: CTAB, no increased work of breathing Abdomen:  Extremities: warm, well perfused, distal pulses intact,  Skin: calcium deposits visible on anterior legs, especially over knees Psych: appropriate affect  Laboratory:  Recent Labs Lab 06/28/16 1409 06/29/16 0535  WBC 17.1* 10.5  HGB 10.3* 9.0*  HCT 32.7* 29.0*  PLT 266 223    Recent Labs Lab 06/28/16 1409 06/29/16 0535  NA 137 136  K 6.6* 4.0  CL 99* 95*  CO2 25 30  BUN 51* 17  CREATININE 11.51* 5.82*  CALCIUM 7.0* 6.7*  PROT 7.7  --   BILITOT 1.3*  --   ALKPHOS 176*  --   ALT 18  --   AST 28  --   GLUCOSE 64* 61*  Mg: 1.7 Phos: 2.2 Lipase: 26 Blood Cx: pending AM EKG:  Possible Left atrial enlargement Possible Inferior infarct , age undetermined ST & T wave abnormality, consider lateral ischemia Prolonged QT Abnormal ECG Since previous tracing Right bundle branch block with marked IVCD has resolved.  Imaging/Diagnostic Tests: CT Abd/Pelvis: Fluid filled colon, w/o assoc. Inflammatory changes. Significant inflammatory changes surrounding right pelvic transplant kidney of surrouding fat, as well as loss of the hilar fat with expansion of the kidney. Findings are nonspecific, and may represent pyelonephritis, chronic graft rejection, or potentially post-transplant lymphoproliferative disorder.   Kristeen MansPaul G Abraham, Medical Student 06/29/2016, 9:01 AM [place] [facility] FPTS Intern pager: [phone], text pages welcome  RESIDENT ADDENDUM I have  separately seen and examined the patient. I have discussed the findings and exam with the medical student and agree with the above note. I helped develop the management plan that is described in the student's note, and I agree with the content. Additionally I have outlined my exam and assessment/plan below:  Moreen FowlerCayton Braniff is a [age] y.o. male presenting with generalized weakness and n/v. PMH is significant for ESRD on Dialysis T/R/S, HTN, Paroxysmal Afib, Right Renal Transplant, Hypocalcemia s/p parathyroidectomy  Feels that his N/V is improving however does report continued abdominal pain. Denies chest pain/SOB  PE: General: NAD CV: RRR Resp: CATB Abd: soft, tender at RLQ Extremities: no LE edema Skin: no rashes noted  Renal Transplant Hx - DDKT 2010 - Renal failure 2015 with initiation of HD - in 2016 patient self d/ced immunosuppresion therapy abd developed allogaft intolerance with weight loss, nausea, refractory anemia, and graft tenderness - after being started on prednisone his symptoms resolved (noted in care everywhere 05/02/2016, Dr Jean RosenthalPirkle)   A/P: Alysia PennaClayton Knittle is a 55 y.o. male admitted for ith generalized weakness and n/v. PMH is significant for ESRD on Dialysis T/R/S, HTN, Paroxysmal Afib, Right Renal Transplant, Hypocalcemia s/p parathyroidectomy   Generalized weakness/Concern for Allograft intolerance- given abdominal pain over allograft site, fever on admission, elevated WBC and GI symptoms there is concern for rejection of the allograft/allograft intolerance. [facility] as well as Mr Shaft's home nephrologist, [facility] Dr Jean Rosenthal states that the patient has had rejection issues (allograft intolerance) in past which have improved with prednisone. He recommends prednisone 40 qD x3 weeks then start a slow taper - will rule out other infectious causes, will obtain CXR, unable to get urine as he is anuric, patient does have  a lesion on his which he feels is improved and is not infected appearing at this time - if CXR neg for evidence of infection, will start solumedrol 125 then continue prednisone - BCx--> NGTD, follow final Cx - follow closely with nephro, appreciate recs  ESRD - continue HD as scheduled TTHS - follow renal function panel  Penile lesion- improving, no evidence of infection - Wound care following  Hypokalcemia - Tums 4 tabs tid with meals,  phoslo binder and Rocaltrol 1 ug hs  - continue to trend    FEN/GI: Renal diet, SLIV PPX: heparin  Dispo: Pending clinical improvement   Lisseth Brazeau A. Kennon Rounds MD, MS Family Medicine Resident PGY-3 Pager [phone]

## 2016-06-29 NOTE — Discharge Summary (Signed)
[facility] Discharge Summary  Patient name: Samuel Rowland Medical record number: [id number] Date of birth: [birth year] Age: 55 y.o. Gender: male Date of Admission: 06/28/2016  Date of Discharge: 07/01/16 Admitting Physician: Lupita Dawn, MD  Primary Care Provider: Jenny Reichmann, MD Consultants: nephrology  Indication for Hospitalization:   Discharge Diagnoses/Problem List:  Patient Active Problem List   Diagnosis Date Noted  . ESRD (end stage renal disease) ([place]) 06/29/2016  . Malnutrition of moderate degree 06/29/2016  . Abdominal pain   . Heel ulcer ([place])   . Chronic systolic congestive heart failure ([place])   . Hyperkalemia 06/28/2016  . Hypocalcemia 05/31/2016  . Postsurgical hypoparathyroidism ([place]) 05/31/2016  . Penile ulcer 05/31/2016  . 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 Scrotal abscess 02/26/2016  . Sebaceous cyst of scrotum 02/26/2016  . Cellulitis, scrotum 02/22/2016  . Abnormal nuclear stress test 12/15/2015  . ESRD on dialysis ([place]) 12/15/2015  . Other hypertrophic cardiomyopathy ([place]) 11/26/2015  . Atrial fibrillation ([place]) [I48.91] 11/17/2015  . Long term (current) use of anticoagulants [Z79.01] 11/17/2015  . Paroxysmal a-fib ([place]) 11/12/2015  . Anemia of chronic kidney failure 11/12/2015  . 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 Acute kidney injury ([place])   . Essential hypertension   . Metabolic acidosis 64/40/3474  . Protein-calorie malnutrition, severe ([place]) 09/20/2014  . AKI (acute kidney injury) ([place]) 09/20/2014  . Renal transplant recipient 09/20/2014  . Prostatitis, acute 09/19/2014    Disposition: home  Discharge Condition: Stable  Discharge Exam:  General: sitting up in bed, NAD Cardiovascular: RRR, 2/6 holosystolic murmur, no gallops or rubs Respiratory: no increased work of breathing Abdomen: Normal bowel sounds, non-distended, nontender Extremities: warm, well perfused MSK:  5/5 strength bilaterally Psych: appropriate affect  Brief Hospital Course:  Eduar Kumpf is a 55 y.o. male who p/w generalized weakness and n/v. PMH is significant for ESRD on Dialysis T/R/S, HTN, Paroxysmal Afib, Right Renal Transplant, Hypocalcemia s/p parathyroidectomy.   Renal Allograft Intolerance Pt was febrile in ED to 101.14F but afebrile during admission. WBC of 17.1 although chronically elevated d/t daily prednisone. CTAbd/Pelvis showed inflammatory changes surrounding R transplanted kidney concerning for allograft rejection. Normal Chest Xray. Pt was given '125mg'$  IV of methylprednisolone followed by prednisone '40mg'$  PO daily.  ESRD HD was provided during first evening of hospitalization to correct hyperkalemia. Continued on T,R,S schedule.   Calcific Uremic Arteriolopathy Presented with a R posterior calf wound and penile lesion concerning for calciphylaxis in the setting of ESRD and right renal transplant. No progression was noted during hospitalization. To be started on Na thiosulfate w/ dialysis  Hypocalcemia S/p parathyroidectomy on 05/18/16 with removal of 3.5 glands. Uncorrected Ca of 7.0 on admit. Uncorrected Ca goal of 7-8 to achieve low corrected calcium in setting of calcific uremic arteriolopathy. Given 1g IV calcium gluconate, 1 amp bicarb, daily calcitriol, and tums during inpatient stay.   Issues for Follow Up:  1. Primary nephrologist follow up on prednisone '40mg'$  for allograft rejection with strong suggestion to consider nephrectomy of transplanted kidney given allograft rejection 2. Adjust dialysis meds for calcific uremic arteriolopathy. 3. Consider urology follow up for penile lesion.  Significant Procedures: none  Significant Labs and Imaging:   Recent Labs Lab 06/29/16 0535 06/30/16 0500 07/01/16 0524  WBC 10.5 7.7 14.7*  HGB 9.0* 11.5* 10.1*  HCT 29.0* 36.2* 33.1*  PLT 223 298 267    Recent Labs Lab 06/28/16 1409 06/29/16 0535 06/30/16 0500  07/01/16 0524  NA 137 136 134* 132*  K 6.6* 4.0 4.9 4.4  CL 99* 95* 92* 92*  CO2 _0 GLUCOSE 64* 61* 172* 148*  BUN 51* 17 25* 19  CREATININE 11.51* 5.82* 7.65* 5.07*  CALCIUM 7.0* 6.7* 7.2* 7.7*  MG  --  1.7  --   --   PHOS  --  2.2* 3.5 2.4*  ALKPHOS 176*  --   --   --   AST 28  --   --   --   ALT 18  --   --   --   ALBUMIN 2.5*  --  2.6* 2.3*   CT Abd/Pelvis: Fluid filled colon, w/o assoc. Inflammatory changes. Significant inflammatory changes surrounding right pelvic transplant kidney of surrouding fat, as well as loss of the hilar fat with expansion of the kidney. Findings are nonspecific, and may represent pyelonephritis, chronic graft rejection, or potentially post-transplant lymphoproliferative disorder.    Results/Tests Pending at Time of Discharge: none  Discharge Medications:    Medication List    TAKE these medications   amiodarone 400 MG tablet Commonly known as:  PACERONE Take 1 tablet (400 mg total) by mouth daily.   aspirin 325 MG tablet Take 325 mg by mouth daily.   calcitRIOL 1 MCG/ML solution Commonly known as:  ROCALTROL Take 1 mcg by mouth at bedtime.   calcium acetate 667 MG capsule Commonly known as:  PHOSLO Take 667 mg by mouth 3 (three) times daily with meals.   calcium carbonate 500 MG chewable tablet Commonly known as:  TUMS - dosed in mg elemental calcium Chew 4 tablets by mouth 3 (three) times daily with meals.   HYDROcodone-acetaminophen 5-325 MG tablet Commonly known as:  NORCO/VICODIN Take 1 tablet by mouth every 6 (six) hours as needed for moderate pain.   lidocaine 2 % jelly Commonly known as:  XYLOCAINE Place 1 application into the urethra as needed (for pain).   metoprolol tartrate 25 MG tablet Commonly known as:  LOPRESSOR Take 1 tablet (25 mg total) by mouth 2 (two) times daily.   oxyCODONE-acetaminophen 5-325 MG tablet Commonly known as:  PERCOCET/ROXICET Take 2 tablets by mouth every 4 (four) hours as needed  for severe pain.   predniSONE 20 MG tablet Commonly known as:  DELTASONE Take 2 tablets (40 mg total) by mouth daily with breakfast. What changed:  medication strength  how much to take  when to take this   warfarin 2 MG tablet Commonly known as:  COUMADIN Take 1 tablet by mouth daily or as directed by coumadin clinic       Discharge Instructions: Please refer to Patient Instructions section of EMR for full details.  Patient was counseled important signs and symptoms that should prompt return to medical care, changes in medications, dietary instructions, activity restrictions, and follow up appointments.   Follow-Up Appointments: Follow-up Information    CLOSE, Lorne Skeens, NP .   Specialty:  Nephrology Contact information: [place] [address] [place], MD 07/01/2016, 11:42 AM [place]   I have seen and examined the patient. I agree with the above documentation wit changes made as needed   Dorcas Melito A. Lincoln Brigham MD, [place] [facility] Resident PGY-3 Pager [phone]

## 2016-06-29 NOTE — Consult Note (Signed)
Renal Service Consult Note Samuel Rowland  Samuel Rowland 06/28/2016 Samuel Rowland Requesting Physician:  Dr. Randolm Idol  Reason for Consult:  ESRD pt with hyperkalemia HPI: The patient is a 55 y.o. year-old w hx of HTN, ESRD, failed renal Tx, NICM, PAF, syst CHF and CAD presented to ED with gen'Rowland weakness, too weak to go to HD today.  In ED K 6.6 and initial EKG showed narrow QRS but repeat shows marked worsening/ wideining of QRS.  Has rec'Rowland IV Ca and NaHCO3, have ordered IV glucose and insulin and plan for acute HD asap.    No CP, +abd pain, +n/v/Rowland and fevers.  Has "calcium" problems at the tip of the penis and on his legs, they "took my neck glands out" for this.  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  Had PTX 6.28/17 and has struggled some w keeping CA levels up postop.    No prod cough, +fevers 1-2 days, + abd pain and N/V/Rowland.  No joint pain or HA.  On HD x 2 yrs.     ROS  denies CP  no joint pain   no HA  no blurry vision  no rash  no diarrhea  no nausea/ vomiting  no dysuria  no difficulty voiding  no change in urine color    Past Medical History      Past Medical History:  Diagnosis Date  . Anemia, chronic renal failure   . Coronary artery disease cardiologist -- dr Swaziland   mild minimal nonobstrucive CAD per cath 12-23-2015  . ESRD (end stage renal disease) on dialysis [facility])    "[facility]; TTS since ~10/2014" (11/12/2015)  after failed kidney transplant 11/ 2010  . GERD (gastroesophageal reflux disease)   . Heart murmur   . History of acute prostatitis    11/ 2015  . History of esophagitis    2010  . History of hiatal hernia   . History of lower GI bleeding    2010 secondary to hemorrhoids  . History of metabolic acidosis    11/ 2015  . Hypertension   . Kidney transplant as cause of abnormal reaction or later complication    transplant done 11/ [facility] at [facility]  . Nausea and vomiting     chronic  . Nonischemic cardiomyopathy (HCC)   . Paroxysmal atrial fibrillation with [facility]) dx 12/ 2016  at [facility]   cardiologist-  dr Swaziland--  s/p  x2 cardioversions 12/ 2016  . Systolic CHF, chronic (HCC)    Past Surgical History       Past Surgical History:  Procedure Laterality Date  . ANTERIOR CERVICAL DECOMP/DISCECTOMY FUSION  03/ 2000   C5-6   . BACK SURGERY    . CARDIAC CATHETERIZATION N/A 12/23/2015   Procedure: Left Heart Cath and Coronary Angiography;  Surgeon: Peter M Swaziland, MD;  Location: [facility] INVASIVE CV LAB;  Service: Cardiovascular;  Laterality: N/A;  abnormal nuclear test/  moderate single vessel CAD involving first diagonal (70%);  normal LV filling pressures  . CARDIOVASCULAR STRESS TEST  12-09-2015  dr Swaziland   Intermediate risk nuclear study w/ medium size, moderate intensity, partially reversible inferior defect consistent w/ probable thinning VS. small prior infarct, minimal inferor ishcemia, LVEF moderately decreased ef 41% (30-44%) w/ global hypokinesis, severe LVE (findings likely suggest nonischemic cardiomyopathy  . CARDIOVERSION  x2   12/ 2016   one at [facility] and one at [facility] ED  . COLONOSCOPY W/ POLYPECTOMY  10/2009  . ESOPHAGOGASTRODUODENOSCOPY  10/2009  . KIDNEY TRANSPLANT Right 09/2009   "[facility]"  . RENAL BIOPSY  2015   Family History       Family History  Problem Relation Age of Onset  . Hypertension Father    Social History  reports that he has quit smoking. His smoking use included Cigars. He quit after 22.00 years of use. He has never used smokeless tobacco. He reports that he does not drink alcohol or use drugs. Allergies No Known Allergies Home medications        Prior to Admission medications   Medication Sig Start Date End Date Taking? Authorizing Provider  aspirin 325 MG tablet Take 325 mg by mouth daily.   Yes Historical Provider, MD  calcitRIOL (ROCALTROL) 1 MCG/ML solution Take 1 mcg by mouth at bedtime.  06/24/16  Yes Historical Provider, MD  calcium acetate (PHOSLO) 667 MG capsule Take 667 mg by mouth 3 (three) times daily with meals.   Yes Historical Provider, MD  calcium carbonate (TUMS - DOSED IN MG ELEMENTAL CALCIUM) 500 MG chewable tablet Chew 4 tablets by mouth 3 (three) times daily with meals.   Yes Historical Provider, MD  HYDROcodone-acetaminophen (NORCO/VICODIN) 5-325 MG tablet Take 1 tablet by mouth every 6 (six) hours as needed for moderate pain. 02/26/16  Yes Nishant Dhungel, MD  lidocaine (XYLOCAINE) 2 % jelly Place 1 application into the urethra as needed (for pain).    Yes Historical Provider, MD  metoprolol tartrate (LOPRESSOR) 25 MG tablet Take 1 tablet (25 mg total) by mouth 2 (two) times daily. 11/13/15  Yes [name], DO  predniSONE (DELTASONE) 10 MG tablet Take 7 mg by mouth daily.    Yes Historical Provider, MD  amiodarone (PACERONE) 400 MG tablet Take 1 tablet (400 mg total) by mouth daily. Patient not taking: Reported on 06/28/2016 12/15/15   Peter M Swaziland, MD  oxyCODONE-acetaminophen (PERCOCET/ROXICET) 5-325 MG tablet Take 2 tablets by mouth every 4 (four) hours as needed for severe pain. Patient not taking: Reported on 06/28/2016 05/28/16   Lester Kinsman Dowless, PA-C  warfarin (COUMADIN) 2 MG tablet Take 1 tablet by mouth daily or as directed by coumadin clinic Patient not taking: Reported on 06/28/2016 03/28/16   Peter M Swaziland, MD                Vitals:   06/28/16 1515 06/28/16 1545 06/28/16 1600 06/28/16 1730  BP: 148/94 120/80 118/76 150/89  Pulse: 81 87 87 90  Resp: Temp:      TempSrc:      SpO2: 100% 96% 96% 96%  Weight:      Height:       Exam Gen alert, color better than pre HD yest No rash, cyanosis or gangrene Sclera anicteric, throat clear  No jvd or bruits, healing scar from PTX Chest clear bilat RRR no MRG Abd soft ntnd no mass or ascites +bs GU has sloughing skin off of the glan penis MS R calf  w purplish serpentine tender lesions Ext no LE edema / no wounds or ulcers Neuro is alert, Ox 3 , nf  CT abd showing enlarged, shaggy RLQ kidney transplant with lots of stranding. Bigger than the kidney seen on the 2015 CT abdomen.     Dialysis: TTS [place]  LUA AVF  4h  Hep none 170 lbs dry wt   Assessment: 1  Hyperkalemia - w EKG changes, resolved w HD 2  Abd  pain / vomiting /hx renal Tx 2010, back on HD now. Here the abd CT demonstrates marked inflammatory changes in the transplant kidney w enlarged size and this is most likely c/w acute transplant rejection/ necrosis in setting of IS withdrawal after transplant failure.  Have Rowland/w primary team, this is well known cause of abd symptoms, wt loss, anorexia and vomiting in patient post-transplant who have been taken off of their IS medication. Primary team has Rowland/w [facility] and plan is to treat w steroids for now and will f/u w [facility] after DC.  This is likely the cause of a lot of his recent problems; can be a difficult condition to find / diagnose, thanks to primary team for their good efforts.   3  Hypocalcemia - Ca of 6.7 adjusts to 7.9. Goal in this patient w calciphylaxis would be uncorrected Ca of 7-8 (which corrects to 8-9, range of normal is 8.9- 10.3. Would shoot for lowish Ca levels (8-9 corr or 7-8 uncorr) in this patient w calciphylaxis 4  Leg / penis lesions - prob calciphylaxis 5  MBD - sp recent PTX, on tums 4 tid and phoslo binder and Rocaltrol 1 ug hs 6  Hx failed renal Tx 7  HTN 8  PAF  9  Volume - is under dry wt by 3-4 kg   Plan - HD tomorrow, oral steroids  Vinson Moselleob Shandora Koogler MD [facility] [place] pager [phone]    cell [phone] 06/28/2016, 5:41 PM

## 2016-06-29 NOTE — Progress Notes (Signed)
Initial Nutrition Assessment  DOCUMENTATION CODES:   Non-severe (moderate) malnutrition in context of chronic illness  INTERVENTION:  Discontinue Ensure.   Provide Nepro Shake po BID, each supplement provides 425 kcal and 19 grams protein.  Encourage adequate PO intake.   NUTRITION DIAGNOSIS:   Malnutrition related to chronic illness as evidenced by energy intake < 75% for > or equal to 1 month, moderate depletions of muscle mass.  GOAL:   Patient will meet greater than or equal to 90% of their needs  MONITOR:   PO intake, Supplement acceptance, Labs, Weight trends, Skin, I & O's  REASON FOR ASSESSMENT:   Malnutrition Screening Tool    ASSESSMENT:   55 y.o. male presenting with generalized weakness and n/v. PMH is significant for ESRD on Dialysis T/R/S, HTN, Paroxysmal Afib, Right Renal Transplant, Hypocalcemia s/p parathyroidectomy  Pt reports having a decreased appetite which has been ongoing for more than 2 months. Wife at bedside. She reports pt has been only consuming 1 meal a day no matter how much po encouragement she tries to give. Currently meal completion has been 65%. Per Epic weight records, weight has been fluctuating likely related to fluid status. Pt currently has Ensure ordered and has been refusing them. Pt and wife were agreeable to Nepro shake instead. Wife was educated on the low potassium and phosphorous contents in Nepro when compared to Ensure. Wife additionally requested a diet education and for RD to return tomorrow at 11am for education. RD to return tomorrow.   Nutrition-Focused physical exam completed. Findings are no fat depletion, moderate muscle depletion, and no edema.   Labs and medications reviewed.   Diet Order:  Diet renal with fluid restriction Fluid restriction: 1200 mL Fluid; Room service appropriate? Yes; Fluid consistency: Thin  Skin:  Reviewed, no issues  Last BM:  8/8  Height:   Ht Readings from Last 1 Encounters:  06/28/16  6' (1.829 m)    Weight:   Wt Readings from Last 1 Encounters:  06/28/16 163 lb 3.2 oz (74 kg)    Ideal Body Weight:  80.9 kg  BMI:  Body mass index is 22.13 kg/m.  Estimated Nutritional Needs:   Kcal:  2100-2300  Protein:  100-110 grams  Fluid:  1.2 L/day  EDUCATION NEEDS:   Education needs addressed  Roslyn SmilingStephanie Alto Gandolfo, MS, RD, LDN Pager # [phone] After hours/ weekend pager # [phone]

## 2016-06-30 LAB — RENAL FUNCTION PANEL
ANION GAP: 15 (ref 5–15)
Albumin: 2.6 g/dL — ABNORMAL LOW (ref 3.5–5.0)
BUN: 25 mg/dL — ABNORMAL HIGH (ref 6–20)
CALCIUM: 7.2 mg/dL — AB (ref 8.9–10.3)
CHLORIDE: 92 mmol/L — AB (ref 101–111)
CO2: 27 mmol/L (ref 22–32)
CREATININE: 7.65 mg/dL — AB (ref 0.61–1.24)
GFR calc Af Amer: 8 mL/min — ABNORMAL LOW (ref >60)
GFR calc non Af Amer: 7 mL/min — ABNORMAL LOW (ref >60)
GLUCOSE: 172 mg/dL — AB (ref 65–99)
Phosphorus: 3.5 mg/dL (ref 2.5–4.6)
Potassium: 4.9 mmol/L (ref 3.5–5.1)
SODIUM: 134 mmol/L — AB (ref 135–145)

## 2016-06-30 LAB — CBC
HCT: 36.2 % — ABNORMAL LOW (ref 39.0–52.0)
HEMOGLOBIN: 11.5 g/dL — AB (ref 13.0–17.0)
MCH: 26.9 pg (ref 26.0–34.0)
MCHC: 31.8 g/dL (ref 30.0–36.0)
MCV: 84.6 fL (ref 78.0–100.0)
Platelets: 298 10*3/uL (ref 150–400)
RBC: 4.28 MIL/uL (ref 4.22–5.81)
RDW: 19 % — ABNORMAL HIGH (ref 11.5–15.5)
WBC: 7.7 10*3/uL (ref 4.0–10.5)

## 2016-06-30 MED ORDER — SODIUM CHLORIDE 0.9 % IV SOLN: 100.0000 mL | INTRAVENOUS | Status: DC | PRN

## 2016-06-30 MED ORDER — ALTEPLASE 2 MG IJ SOLR: 2.0000 mg | Freq: Once | INTRAMUSCULAR | Status: DC | PRN

## 2016-06-30 MED ORDER — PENTAFLUOROPROP-TETRAFLUOROETH EX AERO: 1.0000 "application " | INHALATION_SPRAY | CUTANEOUS | Status: DC | PRN

## 2016-06-30 MED ORDER — SODIUM THIOSULFATE 25 % IV SOLN
25.0000 g | INTRAVENOUS | Status: DC
  Filled 2016-06-30: qty 100

## 2016-06-30 MED ORDER — LIDOCAINE-PRILOCAINE 2.5-2.5 % EX CREA: 1.0000 "application " | TOPICAL_CREAM | CUTANEOUS | Status: DC | PRN

## 2016-06-30 MED ORDER — HEPARIN SODIUM (PORCINE) 1000 UNIT/ML DIALYSIS: 1000.0000 [IU] | INTRAMUSCULAR | Status: DC | PRN

## 2016-06-30 MED ORDER — LIDOCAINE HCL (PF) 1 % IJ SOLN: 5.0000 mL | INTRAMUSCULAR | Status: DC | PRN

## 2016-06-30 NOTE — Progress Notes (Signed)
Pt in HemoDialysis- will recheck on pt later in day or next day for OT eval  Lise AuerLori Jatavious Peppard, [place]OT [phone]

## 2016-06-30 NOTE — Progress Notes (Signed)
Family Medicine Teaching Service Daily Progress Note Intern Pager: [phone]  Patient name: Samuel Rowland Medical record number: [id number] Date of birth: 01-01-1961 Age: 55 y.o. Gender: male  Primary Care Provider: Lucilla Edin, MD Consultants: nephrology Code Status: FULL  Pt Overview and Major Events to Date:  Admitted on 06/28/16  Assessment and Plan: Sachit Gilman a 55 y.o.malepresenting with generalized weakness and n/v. PMH is significant for ESRD on Dialysis T/R/S, HTN, Paroxysmal Afib, Right Renal Transplant, Hypocalcemia s/p parathyroidectomy  Renal allograft intolerance, subacute: T 101.50F and WBC 17.1 in ED, but afebrile since then and WBC downtrended to 7.7 this am. Chronically elevated WBC d/t prednisone. 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 Hypotensive this am to 91/59. Also considered UTI/pyelonephritis vs hypovolemia d/t vomiting vs colitis. Will need close follow-up w/primary nephrologist, Dr. Jean Rosenthal, at [facility].  - Nephrology consulted, appreciate recommendations -  of methylprednisolone on (8/9) followed by prednisone  (8/10-) - Ensure for decreased po intake - monitor fever curve, give antibx if fever recurs - IV phenergan prn -blood cultures NG <24h   ESRD Patient with a history of ESRD with transplant in 2010 and subsequent rejection. T/TH/Sat dialysis. Missed HD appt prior to arrival.  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 No progression noted. - follow up with urology as an outpatient - wound care c/s, appreciate recs - Norco 5-325 q6PRN, lidocaine gel   Hypocalcemia, resolved Corrected calcium of 8.3  today. Corrected Ca goal of 8-9 in setting of calcific uremic arteriolopathy. Patient underwent a parathyroidectomy on 6/28 with removal of 3 and 1/2 glands. Had n/v w/po calcium as outpt. Hypocalcemia most likely 2/2 parathyroidectomy and inability to take meds. Albumin 2.6. Phosphorus low to 2.2.  - Nephrology consulted for HD, appreciate assistance. - calcium gluconate 1g IV and I amp bicarbgiven in ED - Follow BMP - calcitriol capsule - Tums  between meals and qHS - continue home Phoslo   Paroxysmal Atrial Fibrillation. Chadsvasc1. 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 Well controlled with home regimen. Pulse 60.  - Metoprolol 25 mg - Continue to monitor  Hypoglycemia, resolved Serum blood sugar of 143 this morning. No history of diabetes. - encourage oral intake  Hyperkalemia, resolved  Potassium now 4.9. Potassium on admission was 6.6, most likely secondary to missed HD session. No peaked T waves noted on EKG. Patient is s/p Calcium Gluconate and Insulin injection.  -Monitor BMP  FEN/GI: renal diet PPx: Lovenox  Disposition: pending  Subjective:  Feeling much better after last night's dose of steroids. Felt like he had an "edge" with mor energy, ability to move around with less pain in his penis and lower extremities.   Pt says his memory is poor but believes he has an outpatient urology appt scheduled but that we need to talk with wife who is in charge of appts. Believes the lesion on his penis and right calf started the within 24 hours of his parathyroidectomy on 05/18/16.  Objective: Temp:  [98 F (36.7 C)-98.2 F (36.8 C)] 98 F (36.7 C) (08/10 0542) Pulse Rate:  [60-76] 60 (08/10 0542) Resp:  [  18] 18 (08/10 0542) BP: (91-133)/(57-79) 91/59 (08/10 0542) SpO2:  [96 %-100 %] 100 % (08/10  0542) Weight:  [75.2 kg (165 lb 11.2 oz)] 75.2 kg (165 lb 11.2 oz) (08/09 2041) Physical Exam: General: resting comfortably in HD bed, NAD Cardiovascular: RRR, 2/6 holosystolic murmur, no gallops or rubs Respiratory: CTAB, no increased work of breathing Abdomen: Normal bowel sounds, non-distended, nontender Extremities: warm, well perfused MSK: 5/5 strength bilaterally Psych: appropriate affect  Laboratory:  Recent Labs Lab 06/28/16 1409 06/29/16 0535 06/30/16 0500  WBC 17.1* 10.5 7.7  HGB 10.3* 9.0* 11.5*  HCT 32.7* 29.0* 36.2*  PLT 266 223 298    Recent Labs Lab 06/28/16 1409 06/29/16 0535 06/30/16 0500  NA 137 136 134*  K 6.6* 4.0 4.9  CL 99* 95* 92*  CO2 25 30 27   BUN 51* 17 25*  CREATININE 11.51* 5.82* 7.65*  CALCIUM 7.0* 6.7* 7.2*  PROT 7.7  --   --   BILITOT 1.3*  --   --   ALKPHOS 176*  --   --   ALT 18  --   --   AST 28  --   --   GLUCOSE 64* 61* 172*  Albumin 2.6  Imaging/Diagnostic Tests: CXR: Enlargement of cardiac silhouette with pulmonary vascular Congestion. No acute infiltrate.  Kristeen MansPaul G Abraham, Medical Student 06/30/2016, 7:06 AM [facility] FPTS Intern pager: [phone], text pages welcome   RESIDENT ADDENDUM I have separately seen and examined the patient. I have discussed the findings and exam with the medical student and agree with the above note. I helped develop the management plan that is described in the student's note, and I agree with the content. Additionally I have outlined my exam and assessment/plan below:  Samuel Newsomeis a 55 y.o.malepresenting with generalized weakness and n/v. PMH is significant for ESRD on Dialysis T/R/S, HTN, Paroxysmal Afib, Right Renal Transplant, Hypocalcemia s/p parathyroidectomy  Resoled nausea, improving abdominal pain. Denies chest pain/SOB  PE: General: NAD CV: RRR Resp: CATB Abd: soft, tender at RLQ Extremities: no LE edema Skin: no rashes noted  Renal Transplant  Hx - DDKT 2010 - Renal failure 2015 with initiation of HD - in 2016 patient self d/ced immunosuppresion therapy abd developed allogaft intolerance with weight loss, nausea, refractory anemia, and graft tenderness - after being started on prednisone his symptoms resolved (noted in care everywhere 05/02/2016, Dr Jean RosenthalPirkle)   A/P: Samuel Rowland is a 55 y.o. male admitted for ith generalized weakness and n/v. PMH is significant for ESRD on Dialysis T/R/S, HTN, Paroxysmal Afib, Right Renal Transplant, Hypocalcemia s/p parathyroidectomy   Generalized weakness/Allograft rejection- given abdominal pain over allograft site, fever on admission, elevated WBC and GI symptoms there is concern for rejection of the allograft/allograft intolerance. [facility] as well as Samuel Rowland's home nephrologist, [facility] Dr Jean RosenthalPirkle states that the patient has had rejection issues (allograft intolerance) in past which have improved with prednisone. He recommends prednisone 40 qD x3 weeks then start a slow taper - will rule out other infectious causes, will obtain CXR, unable to get urine as he is anuric, patient does have a lesion on his which he feels is improved and is not infected appearing at this time - s/p solumedrol, now on prednisone 40 qD - BCx--> NGTD, follow final Cx - follow closely with nephro, appreciate recs  ESRD - continue HD as scheduled TTHS - follow renal function panel  Penile lesion- improving,  no evidence of infection - Wound care following  Hypokalcemia - Tums 4 tabs tid with meals,  phoslo binder and Rocaltrol 1 ug hs  - continue to trend    FEN/GI: Renal diet, SLIV PPX: heparin  Dispo: Pending clinical improvement  Shontay Wallner A. Kennon Rounds MD, MS Family Medicine Resident PGY-3 Pager [phone]

## 2016-06-30 NOTE — Evaluation (Signed)
Physical Therapy Evaluation Patient Details Name: Samuel Rowland MRN: [id number] DOB: 1961/08/12 Today's Date: 06/30/2016   History of Present Illness  Patient is a 55 yo male admitted 06/28/16 with nausea, vomiting, hyperkalemia, hypocalcemia, weakness.   PMH:  ESRD on HD, renal transplant/rejection, HTN, PAF  Clinical Impression  Patient is functioning at Mod I to independent level with all mobility and gait.  Good balance during high level balance activities.  No acute PT needs identified - PT will sign off.    Follow Up Recommendations No PT follow up;Supervision for mobility/OOB    Equipment Recommendations  None recommended by PT    Recommendations for Other Services       Precautions / Restrictions Precautions Precautions: None Restrictions Weight Bearing Restrictions: No      Mobility  Bed Mobility               General bed mobility comments: Patient standing as PT entered room  Transfers Overall transfer level: Modified independent Equipment used: None             General transfer comment: Increased time  Ambulation/Gait Ambulation/Gait assistance: Modified independent (Device/Increase time) Ambulation Distance (Feet): 94 Feet Assistive device: None Gait Pattern/deviations: Step-through pattern;Decreased stride length Gait velocity: decreased Gait velocity interpretation: Below normal speed for age/gender General Gait Details: Patient with good gait pattern and balance.  Stairs            Wheelchair Mobility    Modified Rankin (Stroke Patients Only)       Balance Overall balance assessment: Independent               Single Leg Stance - Right Leg: 18 Single Leg Stance - Left Leg: 22     Rhomberg - Eyes Opened: 30 Rhomberg - Eyes Closed: 30 (min sway) High level balance activites: Backward walking;Direction changes;Turns;Sudden stops;Head turns (Tandem gait) High Level Balance Comments: No loss of balance with high level  balance activities             Pertinent Vitals/Pain Pain Assessment: 0-10 Pain Score: 4  Pain Intervention(s): RN gave pain meds during session (as PT initiating session)    Home Living Family/patient expects to be discharged to:: Private residence Living Arrangements: Spouse/significant other Available Help at Discharge: Family;Available PRN/intermittently (Works 2 am to 10:30 am; patient sleeps while wife works) Type of Home: [employer] Access: Stairs to enter Entrance Stairs-Rails: Right Secretary/administrator of Steps: 2 Home Layout: Two level;Able to live on main level with bedroom/bathroom Home Equipment: Dan Humphreys - 2 wheels;Wheelchair - manual      Prior Function Level of Independence: Independent               Hand Dominance        Extremity/Trunk Assessment   Upper Extremity Assessment: Overall WFL for tasks assessed           Lower Extremity Assessment: Overall WFL for tasks assessed      Cervical / Trunk Assessment: Normal  Communication   Communication: No difficulties  Cognition Arousal/Alertness: Awake/alert Behavior During Therapy: WFL for tasks assessed/performed Overall Cognitive Status: Within Functional Limits for tasks assessed                      General Comments      Exercises        Assessment/Plan    PT Assessment Patent does not need any further PT services  PT Diagnosis Generalized weakness;Acute pain   PT Problem  List    PT Treatment Interventions     PT Goals (Current goals can be found in the Care Plan section) Acute Rehab PT Goals PT Goal Formulation: All assessment and education complete, DC therapy    Frequency     Barriers to discharge        Co-evaluation               End of Session   Activity Tolerance: Patient tolerated treatment well Patient left: in chair;with call bell/phone within reach;with family/visitor present Nurse Communication: Mobility status         Time:  [id number] PT Time Calculation (min) (ACUTE ONLY): 19 min   Charges:   PT Evaluation $PT Eval Low Complexity: 1 Procedure     PT G CodesVena Austria:        Daisha Filosa H 06/30/2016, 2:47 PM Durenda HurtSusan H. Renaldo Fiddleravis, PT, [facility] Acute Rehab Services Pager [phone]

## 2016-06-30 NOTE — Progress Notes (Addendum)
[facility] Progress Note   Subjective: feels " a lot better", less abd pain, and less nausea  Vitals:   06/30/16 0830 06/30/16 0900 06/30/16 0930 06/30/16 1000  BP: 122/89 113/71 112/72 97/64  Pulse: 61 (!) 55 (!) 52 (!) 52  Resp: Temp:      TempSrc:      SpO2:      Weight:      Height:        Inpatient medications: . amiodarone  200 mg Oral Daily  . aspirin  325 mg Oral Daily  . calcitRIOL  1 mcg Oral QHS  . calcium acetate  667 mg Oral TID WC  . calcium carbonate  4 tablet Oral TID WC  . calcium gluconate 1 GM IV  1 g Intravenous Once  . feeding supplement (NEPRO CARB STEADY)  237 mL Oral BID BM  . heparin subcutaneous  5,000 Units Subcutaneous Q8H  . lidocaine  1 application Urethral BID  . metoprolol tartrate  25 mg Oral BID  . predniSONE  40 mg Oral Q breakfast     sodium chloride, sodium chloride, alteplase, heparin, HYDROcodone-acetaminophen, lidocaine (PF), lidocaine-prilocaine, pentafluoroprop-tetrafluoroeth, promethazine  Exam: Looks better No jvd Chest clear bilat RRR no mrg Abd tender transplant kidney RLQ, no rebound, +BS no ascites GU penile skin sloughing Ext R calf purplish tender skin discoloration post NF , Ox3 LUA AVF+bruit  Dialysis: TTS [place] LUA AVF  4h  170lbs  Hep none      Assessment: 1.  Abd pain/ transplant rejection - per CT/ exam, improving symptoms with IV/ po steroids.  This is 4th hospitalization since May according to patient.  [facility] recommends po steroids.  Suggested he consider having the kidney removed given recurrent admissions.  2.  Painful penile/ leg lesions - clinically c/w calciphylaxis.  Will start him on Na thiosulfate w HD tiw. Will d/w his HD unit in [facility] upon dc. Requires Ca++ products because of recent PTX. Shoot for Ca levels 7-8 uncorrected (8-9 corrected).    3.  Hyperkalemia - resolved 4.  ESRD - HD tts 5.  Hx renal Tx from approx 2010- 2015 6.  HTN 7.  PAF 8.  Vol - is  under dry wt by about 4 lbs 9.  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  Getting TUms/ phoslo 1 ac/ calcitriol 1ug hs and uncorrected Ca is in goal range of 7- 8 mg/dL (have set goal uncorr Ca 1 mg/dL below normal range due to active calciphylaxis condition).   Plan - HD today, o/w as above   Vinson Moselle MD [facility] pager [phone]    cell [phone] 06/30/2016, 10:19 AM    Recent Labs Lab 06/28/16 1409 06/29/16 0535 06/30/16 0500  NA 137 136 134*  K 6.6* 4.0 4.9  CL 99* 95* 92*  CO2 GLUCOSE 64* 61* 172*  BUN 51* 17 25*  CREATININE 11.51* 5.82* 7.65*  CALCIUM 7.0* 6.7* 7.2*  PHOS  --  2.2* 3.5    Recent Labs Lab 06/28/16 1409 06/30/16 0500  AST 28  --   ALT 18  --   ALKPHOS 176*  --   BILITOT 1.3*  --   PROT 7.7  --   ALBUMIN 2.5* 2.6*    Recent Labs Lab 06/28/16 1409 06/29/16 0535 06/30/16 0500  WBC 17.1* 10.5 7.7  HGB 10.3* 9.0* 11.5*  HCT 32.7* 29.0* 36.2*  MCV 86.3 84.1  84.6  PLT 266 223 298   Iron/TIBC/Ferritin/ %Sat No results found for: IRON, TIBC, FERRITIN, IRONPCTSAT

## 2016-06-30 NOTE — Plan of Care (Signed)
Problem: Food- and Nutrition-Related Knowledge Deficit (NB-1.1) Goal: Nutrition education Formal process to instruct or train a patient/client in a skill or to impart knowledge to help patients/clients voluntarily manage or modify food choices and eating behavior to maintain or improve health. Outcome: Completed/Met Date Met: 06/30/16 Nutrition Education Note  Pt and wife requested diet education. Provided "Eating Healthy with Kidney Disease" and additionally "Calcium content of food" handout to patient. Wife was not present during scheduled time of education. Reviewed food groups and provided written recommended serving sizes specifically determined for patient's current nutritional status.   Explained why diet restrictions are needed and provided lists of foods to limit/avoid that are high potassium, sodium, and phosphorus. Provided specific recommendations on safer alternatives of these foods. Strongly encouraged compliance of this diet.   Discussed importance of protein intake at each meal and snack. Provided examples of how to maximize protein intake throughout the day. Discussed need for fluid restriction with dialysis and renal-friendly beverage options. Teach back method used.  Expect good compliance.  Corrin Parker, MS, RD, LDN Pager # [phone] After hours/ weekend pager # [phone]

## 2016-07-01 DIAGNOSIS — T8611 Kidney transplant rejection: Principal | ICD-10-CM

## 2016-07-01 DIAGNOSIS — T8612 Kidney transplant failure: Secondary | ICD-10-CM

## 2016-07-01 LAB — RENAL FUNCTION PANEL
ANION GAP: 13 (ref 5–15)
Albumin: 2.3 g/dL — ABNORMAL LOW (ref 3.5–5.0)
BUN: 19 mg/dL (ref 6–20)
CHLORIDE: 92 mmol/L — AB (ref 101–111)
CO2: 27 mmol/L (ref 22–32)
CREATININE: 5.07 mg/dL — AB (ref 0.61–1.24)
Calcium: 7.7 mg/dL — ABNORMAL LOW (ref 8.9–10.3)
GFR calc non Af Amer: 12 mL/min — ABNORMAL LOW (ref >60)
GFR, EST AFRICAN AMERICAN: 13 mL/min — AB (ref >60)
Glucose, Bld: 148 mg/dL — ABNORMAL HIGH (ref 65–99)
POTASSIUM: 4.4 mmol/L (ref 3.5–5.1)
Phosphorus: 2.4 mg/dL — ABNORMAL LOW (ref 2.5–4.6)
Sodium: 132 mmol/L — ABNORMAL LOW (ref 135–145)

## 2016-07-01 LAB — CBC
HEMATOCRIT: 33.1 % — AB (ref 39.0–52.0)
HEMOGLOBIN: 10.1 g/dL — AB (ref 13.0–17.0)
MCH: 26.2 pg (ref 26.0–34.0)
MCHC: 30.5 g/dL (ref 30.0–36.0)
MCV: 85.8 fL (ref 78.0–100.0)
Platelets: 267 10*3/uL (ref 150–400)
RBC: 3.86 MIL/uL — AB (ref 4.22–5.81)
RDW: 19.2 % — ABNORMAL HIGH (ref 11.5–15.5)
WBC: 14.7 10*3/uL — ABNORMAL HIGH (ref 4.0–10.5)

## 2016-07-01 LAB — HEPATITIS B SURFACE ANTIGEN: HEP B S AG: NEGATIVE

## 2016-07-01 MED ORDER — PREDNISONE 20 MG PO TABS: 40.0000 mg | ORAL_TABLET | Freq: Every day | ORAL | 3 refills | Status: DC

## 2016-07-01 NOTE — Progress Notes (Signed)
Family Medicine Teaching Service Daily Progress Note Intern Pager: [phone]  Patient name: Samuel Rowland Medical record number: [id number] Date of birth: 04-13-1961 Age: 55 y.o. Gender: male  Primary Care Provider: Lucilla Edin, MD Consultants: neprology Code Status: FULL  Pt Overview and Major Events to Date:  Admitted on 06/28/16  Assessment and Plan:  Halston Kintz a 55 y.o.malepresenting with generalized weakness and n/v. PMH is significant for ESRD on Dialysis T/R/S, HTN, Paroxysmal Afib, Right Renal Transplant, Hypocalcemia s/p parathyroidectomy  Renal allograft intolerance, subacute: T 101.37F and WBC 17.1 in ED, but afebrile since then. Chronically elevated WBC d/t prednisone. 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 Will need close follow-up w/primary nephrologist, Dr. Jean Rosenthal, at [facility].  - Nephrology consulted, appreciate recommendations -  of methylprednisolone on (8/9) followed by prednisone  (8/10-) - monitor fever curve, give antibx if fever recurs - IV phenergan prn -blood cultures NG <48h   ESRD Patient with a history of ESRD with transplant in 2010 and subsequent rejection. T/TH/Sat dialysis. Missed HD appt prior to arrival.  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 No progression noted. - follow up with nephrology and urology as an outpatient - wound care c/s, appreciate recs - Norco 5-325 q6PRN, lidocaine gel   Hypocalcemia, resolved Corrected calcium of 8.3 today. Corrected Ca goal of 8-9 in setting of calcific uremic arteriolopathy. Patient  underwent a parathyroidectomy on 6/28 with removal of 3 and 1/2 glands. Had n/v w/po calcium as outpt. Hypocalcemia most likely 2/2 parathyroidectomy and inability to take meds. Albumin 2.6. Phosphorus low to 2.2.  - Nephrology consulted for HD, appreciate assistance. - calcium gluconate 1g IV and I amp bicarbgiven in ED - Follow BMP - calcitriol capsule - Tums  between meals and qHS - continue home Phoslo   Paroxysmal Atrial Fibrillation. Chadsvasc1. 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  - Metoprolol 25 mg - Continue to monitor  Hypoglycemia, resolved No history of diabetes. - encourage oral intake  Hyperkalemia, resolved  Potassium now 4.4. Potassium on admission was 6.6, most likely secondary to missed HD session. No peaked T waves noted on EKG. Patient is s/p Calcium Gluconate and Insulin injection.  -Monitor BMP  FEN/GI: renal diet PPx: Lovenox  Disposition: Home on 07/01/16  Subjective:  Feels better today. Ready to go home. Reports decreased pain over R posterior calf wound and penile lesion. Hoping to get his care coordinated and have dialysis meds adjusted as needed to get his calcium levels down.   Objective: Temp:  [96.9 F (36.1 C)-98 F (36.7 C)] 97.6 F (36.4 C) (08/11 0410) Pulse Rate:  [50-66] 62 (08/11 0410) Resp:  [12-24] 20 (08/11 0410) BP: (97-150)/(54-93) 111/79 (08/11 0410) SpO2:  [97 %-100 %] 100 % (08/11 0410) Weight:  [74.4 kg (164 lb 0.4 oz)-75.4 kg (166 lb 3.6 oz)] 74.4 kg (164 lb 0.4 oz) (08/10 1121) Physical Exam: General: sitting up, NAD Cardiovascular: RRR, 2/6 holosystolic murmur, no gallops or rubs Respiratory: no increased work of breathing Abdomen: Normal bowel sounds, non-distended, nontender Extremities: warm, well perfused MSK: 5/5 strength bilaterally  Psych: appropriate  affect  Laboratory:  Recent Labs Lab 06/29/16 0535 06/30/16 0500 07/01/16 0524  WBC 10.5 7.7 14.7*  HGB 9.0* 11.5* 10.1*  HCT 29.0* 36.2* 33.1*  PLT 223 298 267    Recent Labs Lab 06/28/16 1409 06/29/16 0535 06/30/16 0500 07/01/16 0524  NA 137 136 134* 132*  K 6.6* 4.0 4.9 4.4  CL 99* 95* 92* 92*  CO2 25 30 27 27   BUN 51* 17 25* 19  CREATININE 11.51* 5.82* 7.65* 5.07*  CALCIUM 7.0* 6.7* 7.2* 7.7*  PROT 7.7  --   --   --   BILITOT 1.3*  --   --   --   ALKPHOS 176*  --   --   --   ALT 18  --   --   --   AST 28  --   --   --   GLUCOSE 64* 61* 172* 148*  Uncorrected Ca 7.7  Imaging/Diagnostic Tests: none  Kristeen MansPaul G Abraham, Medical Student 07/01/2016, 6:46 AM [facility] FPTS Intern pager: [phone], text pages welcome   RESIDENT ADDENDUM I have separately seen and examined the patient. I have discussed the findings and exam with the medical student and agree with the above note. I helped develop the management plan that is described in the student's note, and I agree with the content. Additionally I have outlined my exam and assessment/plan below:  Samuel Newsomeis a 55 y.o.malepresenting with generalized weakness and n/v. PMH is significant for ESRD on Dialysis T/R/S, HTN, Paroxysmal Afib, Right Renal Transplant, Hypocalcemia s/p parathyroidectomy  Resoled nausea, improving abdominal pain. Denies chest pain/SOB. Feels ready to go home  PE: General: NAD CV: RRR Resp: CATB Abd: soft, tender at RLQ Extremities: no LE edema Skin: no rashes noted  Renal Transplant Hx - DDKT 2010 - Renal failure 2015 with initiation of HD - in 2016 patient self d/ced immunosuppresion therapy abd developed allogaft intolerance with weight loss, nausea, refractory anemia, and graft tenderness - after being started on prednisone his symptoms resolved (noted in care everywhere 05/02/2016, Dr Jean RosenthalPirkle)   A/P: Samuel Ramminglayton Newsomeis a 55 y.o.maleadmitted for ith  generalized weakness and n/v. PMH is significant for ESRD on Dialysis T/R/S, HTN, Paroxysmal Afib, Right Renal Transplant, Hypocalcemia s/p parathyroidectomy   Generalized weakness/Allograft rejection-given abdominal pain over allograft site, fever on admission, elevated WBC and GI symptoms there is concern for rejection of the allograft/allograft intolerance. [facility] well as Mr Gonser[facility]rologist, [facility] Dr Jean RosenthalPirkle states that the patient has had rejection issues (allograft intolerance) in past which have improved with prednisone. He recommends prednisone 40 qD x3 weeks then start a slow taper - s/p solumedrol, now on prednisone 40 qD - BCx--> NGTD, follow final Cx - follow closely with nephro, appreciate recs - follow up with [facility] for further care  ESRD - continue HD as scheduled TTHS - follow renal function panel  Penile/leg lesion- c/w calciphylaxis -Na thiosulfate w HD started by nephro - wound care consulted for lesions, will dress as appropiate - will follow with urology as an outopateint  Penile lesion- improving, no evidence of infection - Wound care following  Hypokalcemia - Tums 4 tabstid with meals,phoslo binder and Rocaltrol 1 ug hs - continue to trend   FEN/GI:Renal diet, SLIV PPX: heparin  Dispo:anticipate d/c today  Davionna Blacksher A. Kennon RoundsHaney MD, MS Family Medicine Resident PGY-3 Pager [phone]

## 2016-07-01 NOTE — Progress Notes (Signed)
OT Cancellation Note  Patient Details Name: Samuel Rowland MRN: [id number] DOB: 10/18/1961   Cancelled Treatment:    Reason Eval/Treat Not Completed: OT screened, no needs identified, will sign off  Janilah Hojnacki, Metro KungLorraine D 07/01/2016, 11:26 AM

## 2016-07-01 NOTE — Care Management Important Message (Signed)
Important Message  Patient Details  Name: Samuel Rowland MRN: [id number] Date of Birth: 07/22/1961   Medicare Important Message Given:  Yes    Baylor Cortez, Annamarie Majorheryl U, RN 07/01/2016, 11:18 AM

## 2016-07-01 NOTE — Progress Notes (Signed)
Samuel Rowland to be D/C'd Home per MD order.  Discussed prescriptions and follow up appointments with the patient. Prescriptions given to patient, medication list explained in detail. Pt verbalized understanding.    Medication List    TAKE these medications   amiodarone 400 MG tablet Commonly known as:  PACERONE Take 1 tablet (400 mg total) by mouth daily.   aspirin 325 MG tablet Take 325 mg by mouth daily.   calcitRIOL 1 MCG/ML solution Commonly known as:  ROCALTROL Take 1 mcg by mouth at bedtime.   calcium acetate 667 MG capsule Commonly known as:  PHOSLO Take 667 mg by mouth 3 (three) times daily with meals.   calcium carbonate 500 MG chewable tablet Commonly known as:  TUMS - dosed in mg elemental calcium Chew 4 tablets by mouth 3 (three) times daily with meals.   HYDROcodone-acetaminophen 5-325 MG tablet Commonly known as:  NORCO/VICODIN Take 1 tablet by mouth every 6 (six) hours as needed for moderate pain.   lidocaine 2 % jelly Commonly known as:  XYLOCAINE Place 1 application into the urethra as needed (for pain).   metoprolol tartrate 25 MG tablet Commonly known as:  LOPRESSOR Take 1 tablet (25 mg total) by mouth 2 (two) times daily.   oxyCODONE-acetaminophen 5-325 MG tablet Commonly known as:  PERCOCET/ROXICET Take 2 tablets by mouth every 4 (four) hours as needed for severe pain.   predniSONE 20 MG tablet Commonly known as:  DELTASONE Take 2 tablets (40 mg total) by mouth daily with breakfast. What changed:  medication strength  how much to take  when to take this   warfarin 2 MG tablet Commonly known as:  COUMADIN Take 1 tablet by mouth daily or as directed by coumadin clinic       Vitals:   07/01/16 0410 07/01/16 0900  BP: 111/79 (!) 157/93  Pulse: 62 96  Resp: 20 20  Temp: 97.6 F (36.4 C) 98.4 F (36.9 C)    Skin clean, dry and intact without evidence of skin break down, no evidence of skin tears noted. IV catheter discontinued  intact. Site without signs and symptoms of complications. Dressing and pressure applied. Pt denies pain at this time. No complaints noted.  An After Visit Summary was printed and given to the patient. Patient escorted via WC, and D/C home via private auto.  Modena NunneryHubbard, Deaundra Kutzer C 07/01/2016 1:53 PM

## 2016-07-01 NOTE — Progress Notes (Signed)
[facility] Progress Note   Subjective: for dc today  Vitals:   06/30/16 1815 06/30/16 2000 07/01/16 0410 07/01/16 0900  BP: 101/65 103/70 111/79 (!) 157/93  Pulse: 63 66 62 96  Resp: Temp: 97.5 F (36.4 C) 97.5 F (36.4 C) 97.6 F (36.4 C) 98.4 F (36.9 C)  TempSrc: Oral Oral Oral Oral  SpO2: 100% 100% 100%   Weight:      Height:        Inpatient medications: . amiodarone  200 mg Oral Daily  . aspirin  325 mg Oral Daily  . calcitRIOL  1 mcg Oral QHS  . calcium acetate  667 mg Oral TID WC  . calcium carbonate  4 tablet Oral TID WC  . calcium gluconate 1 GM IV  1 g Intravenous Once  . feeding supplement (NEPRO CARB STEADY)  237 mL Oral BID BM  . heparin subcutaneous  5,000 Units Subcutaneous Q8H  . lidocaine  1 application Urethral BID  . metoprolol tartrate  25 mg Oral BID  . predniSONE  40 mg Oral Q breakfast  . sodium thiosulfate infusion for calciphylaxis  25 g Intravenous Q T,Th,Sa-HD     HYDROcodone-acetaminophen, promethazine  Exam: Looks better No jvd Chest clear bilat RRR no mrg Abd tender transplant kidney RLQ, no rebound, +BS no ascites GU penile skin sloughing Ext R calf purplish tender skin discoloration post NF , Ox3 LUA AVF+bruit  Dialysis: TTS [place] LUA AVF  4h  170lbs  Hep none      Assessment: 1.  Abd pain/ transplant rejection - per CT/ exam, improving symptoms with IV/ po steroids.  This is 4th hospitalization since May according to patient.  [facility] recommends po steroids.  Suggested he consider having the kidney removed given recurrent admissions, he will d/w his [facility] physicians.   2.  Painful penile/ leg lesions - clinically c/w calciphylaxis.  Will start him on Na thiosulfate w HD tiw. Will d/w his HD unit in [facility] upon dc. Requires Ca++ products because of recent PTX. Shoot for Ca levels 7-8 uncorrected (8-9 corrected).    3.  Hyperkalemia - resolved 4.  ESRD - HD tts 5.  Hx renal Tx from approx 2010-  2015 6.  HTN 7.  PAF 8.  Vol - is under dry wt by about 4 lbs 9.  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  Getting TUms/ phoslo 1 ac/ calcitriol 1ug hs and uncorrected Ca is in goal range of 7- 8 mg/dL (have set goal uncorr Ca 1 mg/dL below normal range due to active calciphylaxis condition).   Plan - for DC today, have discussed pt's condition and details of Rx with staff at his HD unit in [facility], including Ca goals and Na thiosulfate   Vinson Moselle MD [facility] pager [phone]    cell [phone] 07/01/2016, 12:42 PM    Recent Labs Lab 06/29/16 0535 06/30/16 0500 07/01/16 0524  NA 136 134* 132*  K 4.0 4.9 4.4  CL 95* 92* 92*  CO2 GLUCOSE 61* 172* 148*  BUN 17 25* 19  CREATININE 5.82* 7.65* 5.07*  CALCIUM 6.7* 7.2* 7.7*  PHOS 2.2* 3.5 2.4*    Recent Labs Lab 06/28/16 1409 06/30/16 0500 07/01/16 0524  AST 28  --   --   ALT 18  --   --   ALKPHOS 176*  --   --   BILITOT 1.3*  --   --  PROT 7.7  --   --   ALBUMIN 2.5* 2.6* 2.3*    Recent Labs Lab 06/29/16 0535 06/30/16 0500 07/01/16 0524  WBC 10.5 7.7 14.7*  HGB 9.0* 11.5* 10.1*  HCT 29.0* 36.2* 33.1*  MCV 84.1 84.6 85.8  PLT 223 298 267   Iron/TIBC/Ferritin/ %Sat No results found for: IRON, TIBC, FERRITIN, IRONPCTSAT

## 2016-07-01 NOTE — Discharge Instructions (Signed)
Follow up with nephrology on Tuesday morning prior to dialysis concerning kidney transplant and calciphylaxis. Reschedule urology appointment to evaluate for need for scrotal surgery.   Follow-up Information    CLOSE, Mitchell HeirSHERRY MORGAN, NP .   Specialty:  Nephrology Contact information: [address] [place] [id number] [phone]

## 2016-07-03 LAB — CULTURE, BLOOD (ROUTINE X 2)
Culture: NO GROWTH
Culture: NO GROWTH

## 2016-08-11 ENCOUNTER — Ambulatory Visit (INDEPENDENT_AMBULATORY_CARE_PROVIDER_SITE_OTHER): Payer: Medicare Other | Admitting: Physician Assistant

## 2016-08-11 VITALS — BP 130/60 | HR 61 | Temp 98.1°F | Resp 16 | Ht 69.25 in | Wt 166.2 lb

## 2016-08-11 DIAGNOSIS — H109 Unspecified conjunctivitis: Secondary | ICD-10-CM

## 2016-08-11 DIAGNOSIS — H00019 Hordeolum externum unspecified eye, unspecified eyelid: Secondary | ICD-10-CM

## 2016-08-11 MED ORDER — POLYMYXIN B-TRIMETHOPRIM 10000-0.1 UNIT/ML-% OP SOLN: 1.0000 [drp] | Freq: Four times a day (QID) | OPHTHALMIC | 0 refills | Status: AC

## 2016-08-11 NOTE — Progress Notes (Signed)
Samuel Rowland  MRN: [id number] DOB: 1961-01-24  Subjective:  Samuel Rowland is a 55 y.o. male seen in office today for a chief complaint of swollen itching eye x 3 days. Has asociatred purulent discharge that causes eye to be shut in the morning. Denies acute injury, visiual distubarnce, photophobia,  fever, or chills. 5 days ago his right eye had the same issue but it is getting better now. He has tried warm/cold compress with mild relief. Pt does not wear contacts.  Review of Systems Per HPI.  Patient Active Problem List   Diagnosis Date Noted  . Renal transplant failure and rejection   . ESRD (end stage renal disease) ([place]) 06/29/2016  . Malnutrition of moderate degree 06/29/2016  . Abdominal pain   . Heel ulcer ([place])   . Chronic systolic congestive heart failure ([place])   . Hyperkalemia 06/28/2016  . Hypocalcemia 05/31/2016  . 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 Penile ulcer 05/31/2016  . 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 Scrotal abscess 02/26/2016  . Sebaceous cyst of scrotum 02/26/2016  . Cellulitis, scrotum 02/22/2016  . Abnormal nuclear stress test 12/15/2015  . ESRD on dialysis ([place]) 12/15/2015  . Other hypertrophic cardiomyopathy ([place]) 11/26/2015  . Atrial fibrillation ([place]) [I48.91] 11/17/2015  . Long term (current) use of anticoagulants [Z79.01] 11/17/2015  . Paroxysmal a-fib ([place]) 11/12/2015  . Anemia of chronic kidney failure 11/12/2015  . 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 Acute kidney injury ([place])   . Essential hypertension   . Metabolic acidosis 72/53/6644  . Protein-calorie malnutrition, severe ([place]) 09/20/2014  . AKI (acute kidney injury) ([place]) 09/20/2014  . Renal transplant recipient 09/20/2014  . Prostatitis, acute 09/19/2014    Current Outpatient Prescriptions on File Prior to Visit  Medication Sig Dispense Refill  . amiodarone (PACERONE) 400 MG tablet Take 1 tablet (400 mg total) by mouth  daily. 30 tablet 11  . aspirin 325 MG tablet Take 325 mg by mouth daily.    . calcitRIOL (ROCALTROL) 1 MCG/ML solution Take 1 mcg by mouth at bedtime.    . calcium acetate (PHOSLO) 667 MG capsule Take 667 mg by mouth 3 (three) times daily with meals.    . calcium carbonate (TUMS - DOSED IN MG ELEMENTAL CALCIUM) 500 MG chewable tablet Chew 4 tablets by mouth 3 (three) times daily with meals.    . predniSONE (DELTASONE) 20 MG tablet Take 2 tablets (40 mg total) by mouth daily with breakfast. 60 tablet 3  . HYDROcodone-acetaminophen (NORCO/VICODIN) 5-325 MG tablet Take 1 tablet by mouth every 6 (six) hours as needed for moderate pain. (Patient not taking: Reported on 08/11/2016) 20 tablet 0  . lidocaine (XYLOCAINE) 2 % jelly Place 1 application into the urethra as needed (for pain).     . metoprolol tartrate (LOPRESSOR) 25 MG tablet Take 1 tablet (25 mg total) by mouth 2 (two) times daily. 60 tablet 0  . oxyCODONE-acetaminophen (PERCOCET/ROXICET) 5-325 MG tablet Take 2 tablets by mouth every 4 (four) hours as needed for severe pain. (Patient not taking: Reported on 08/11/2016) 15 tablet 0  . warfarin (COUMADIN) 2 MG tablet Take 1 tablet by mouth daily or as directed by coumadin clinic (Patient not taking: Reported on 08/11/2016) 30 tablet 1   No current facility-administered medications on file prior to visit.     No Known Allergies  Objective:  BP 130/60 (BP Location: Right Arm, Patient Position: Sitting, Cuff Size: Normal)   Pulse 61   Temp  98.1 F (36.7 C) (Oral)   Resp 16   Ht 5' 9.25" (1.759 m)   Wt 166 lb 3.2 oz (75.4 kg)   SpO2 100%   BMI 24.37 kg/m   Physical Exam  Constitutional: He is oriented to person, place, and time and well-developed, well-nourished, and in no distress.  HENT:  Head: Normocephalic and atraumatic.  Eyes: Conjunctivae and EOM are normal. Pupils are equal, round, and reactive to light. Right eye exhibits hordeolum. Left eye exhibits discharge (thick purulent  discharge noted at medial and lateral canthus) and hordeolum ( with center pustule on superior eyelid, surrounding erythema and swelling noted). Right conjunctiva is not injected. Left conjunctiva is not injected.    Neck: Normal range of motion.  Pulmonary/Chest: Effort normal.  Lymphadenopathy:       Head (right side): No preauricular and no posterior auricular adenopathy present.       Head (left side): No preauricular and no posterior auricular adenopathy present.  Neurological: He is alert and oriented to person, place, and time. Gait normal.  Skin: Skin is warm and dry.  Psychiatric: Affect normal.  Vitals reviewed.    Assessment and Plan :   1. Conjunctivitis of left eye - trimethoprim-polymyxin b (POLYTRIM) ophthalmic solution; Place 1 drop into the left eye every 6 (six) hours.  Dispense: 10 mL; Refill: 0  2. Hordeolum, unspecified laterality -Bilateral  -Continue warm compress -If no improvement in 7 days, give referral to opthalmologist. -If  fever, chills,or inability to move eye develop, seek medical care immediately.     Tenna Delaine PA-C  [facility] 08/11/2016 5:07 PM

## 2016-08-11 NOTE — Patient Instructions (Addendum)
-Apply warm compress to the affected eye for 10-15 minutes 4-5 times during the day until symptoms improve.  -Avoid aggressively rubbing or cleansing of the eye.  -UseJohnson's baby shampoo to wash the affected area.  -If no improvement in 7 days, return for referral to opthalmologist. -If you develop fever, chills,or inability to move eye, seek medical care immediately.  Use drops in left eye.   Stye A stye is a bump on your eyelid caused by a bacterial infection. A stye can form inside the eyelid (internal stye) or outside the eyelid (external stye). An internal stye may be caused by an infected oil-producing gland inside your eyelid. An external stye may be caused by an infection at the base of your eyelash (hair follicle). Styes are very common. Anyone can get them at any age. They usually occur in just one eye, but you may have more than one in either eye.  CAUSES  The infection is almost always caused by bacteria called Staphylococcus aureus. This is a common type of bacteria that lives on your skin. RISK FACTORS You may be at higher risk for a stye if you have had one before. You may also be at higher risk if you have:  Diabetes.  Long-term illness.  Long-term eye redness.  A skin condition called seborrhea.  High fat levels in your blood (lipids). SIGNS AND SYMPTOMS  Eyelid pain is the most common symptom of a stye. Internal styes are more painful than external styes. Other signs and symptoms may include:  Painful swelling of your eyelid.  A scratchy feeling in your eye.  Tearing and redness of your eye.  Pus draining from the stye. DIAGNOSIS  Your health care provider may be able to diagnose a stye just by examining your eye. The health care provider may also check to make sure:  You do not have a fever or other signs of a more serious infection.  The infection has not spread to other parts of your eye or areas around your eye. TREATMENT  Most styes will clear up  in a few days without treatment. In some cases, you may need to use antibiotic drops or ointment to prevent infection. Your health care provider may have to drain the stye surgically if your stye is:  Large.  Causing a lot of pain.  Interfering with your vision. This can be done using a thin blade or a needle.  HOME CARE INSTRUCTIONS   Take medicines only as directed by your health care provider.  Apply a clean, warm compress to your eye for 10 minutes, 4 times a day.  Do not wear contact lenses or eye makeup until your stye has healed.  Do not try to pop or drain the stye. SEEK MEDICAL CARE IF:  You have chills or a fever.  Your stye does not go away after several days.  Your stye affects your vision.  Your eyeball becomes swollen, red, or painful. MAKE SURE YOU:  Understand these instructions.  Will watch your condition.  Will get help right away if you are not doing well or get worse.   This information is not intended to replace advice given to you by your health care provider. Make sure you discuss any questions you have with your health care provider.   Document Released: 08/17/2005 Document Revised: 11/28/2014 Document Reviewed: 02/21/2014 Elsevier Interactive Patient Education 2016 [employer].  IF you received an x-ray today, you will receive an invoice from [facility]. Please contact [facility]  Radiology at [phone] with questions or concerns regarding your invoice.   IF you received labwork today, you will receive an invoice from [facility]. Please contact [facility] at [phone] with questions or concerns regarding your invoice.   Our billing staff will not be able to assist you with questions regarding bills from these companies.  You will be contacted with the lab results as soon as they are available. The fastest way to get your results is to activate your My Chart account. Instructions are located on the last  page of this paperwork. If you have not heard from us regarding the results in 2 weeks, please contact this office.

## 2016-08-17 IMAGING — CT CT ABD-PELV W/O CM
2 of 4 series · 15 of 46 positions shown, 17 images · non-contrast
Comparison: CT 07/05/2014

CLINICAL DATA: 55-year-old male with a history of weakness

EXAM:
CT ABDOMEN AND PELVIS WITHOUT CONTRAST
TECHNIQUE: Multidetector CT imaging of the abdomen and pelvis was performed
following the standard protocol without IV contrast.

[Series 2: abd/ pelvis 5.0 i30f 1 · axial · 0.72mm/px · z∈[+694,+1129]mm · 12 of 97 slices shown, 14 images]
[im 5/97  soft-tissue]
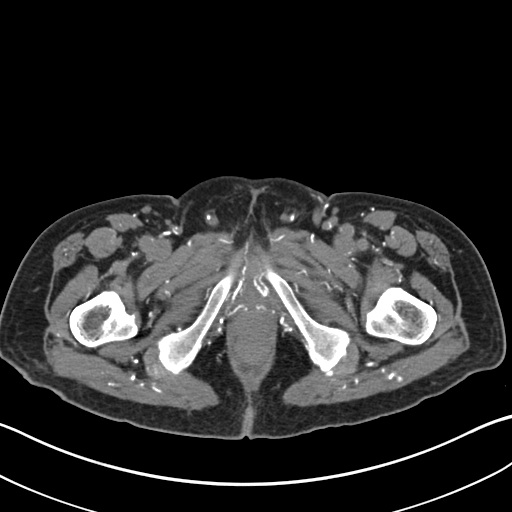
[im 5/97  bone]
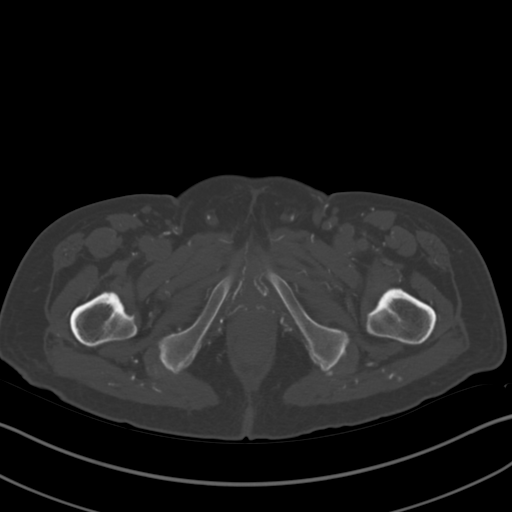
[im 13/97  soft-tissue]
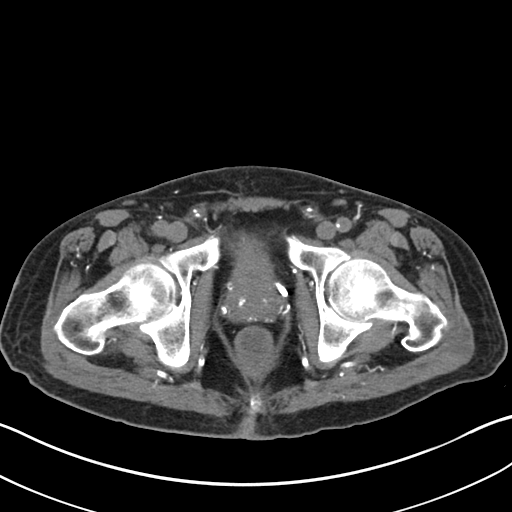
[im 21/97  soft-tissue]
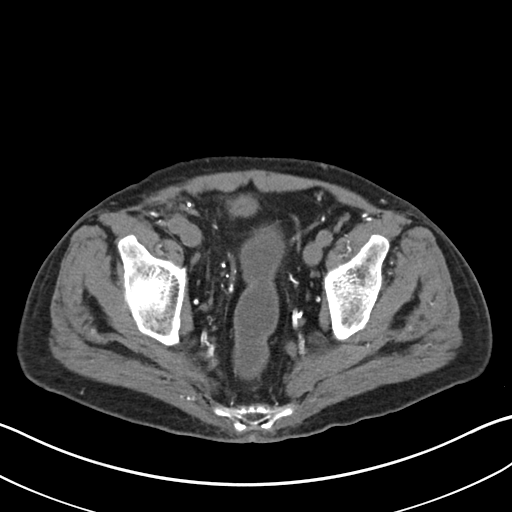
[im 30/97  soft-tissue]
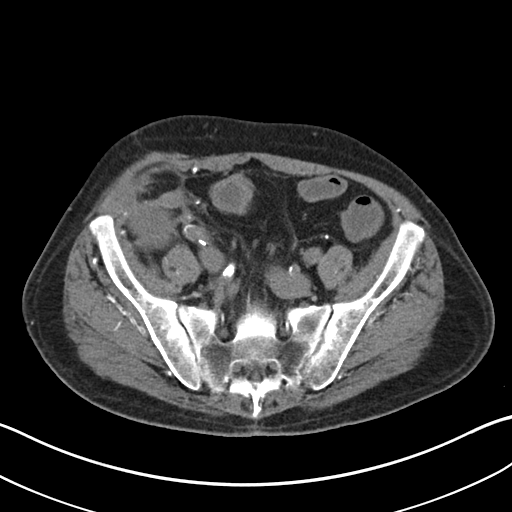
[im 38/97  soft-tissue]
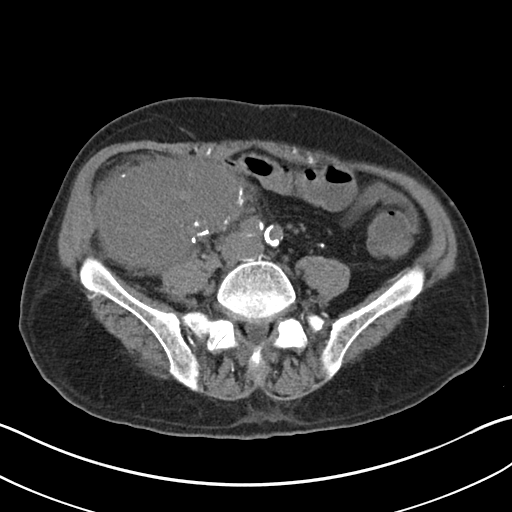
[im 46/97  soft-tissue]
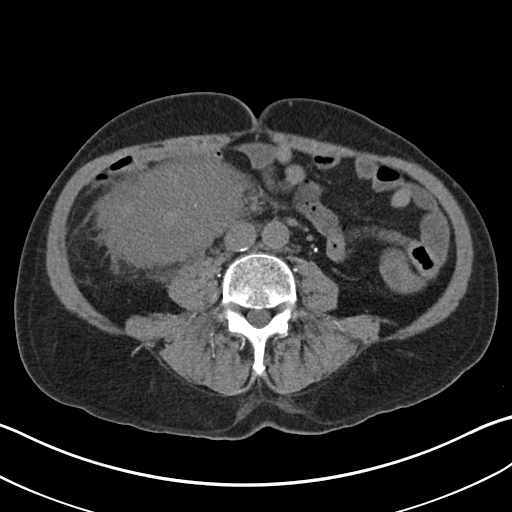
[im 51/97  soft-tissue]
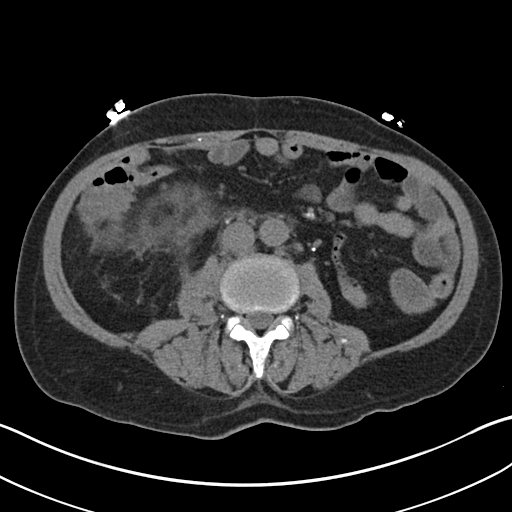
[im 59/97  soft-tissue]
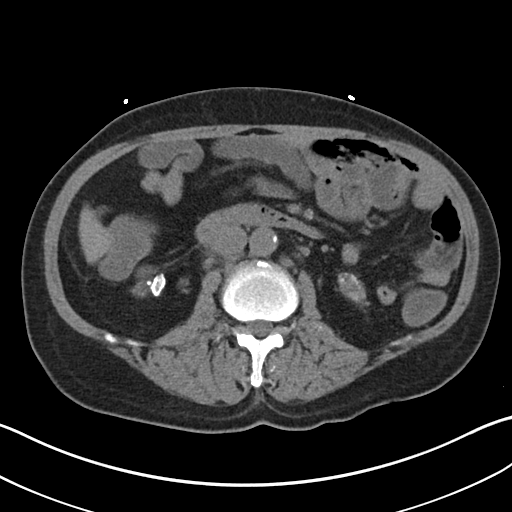
[im 67/97  soft-tissue]
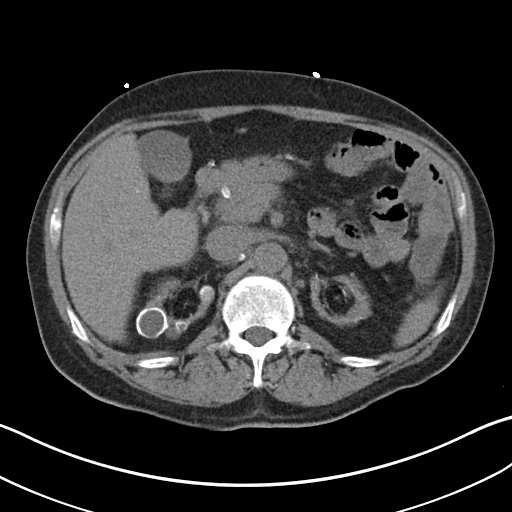
[im 67/97  bone]
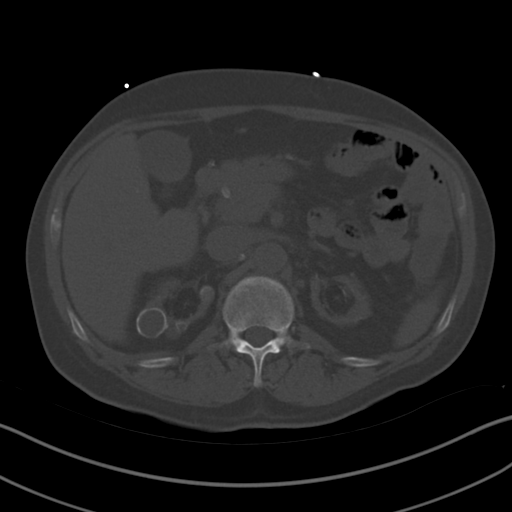
[im 76/97  soft-tissue]
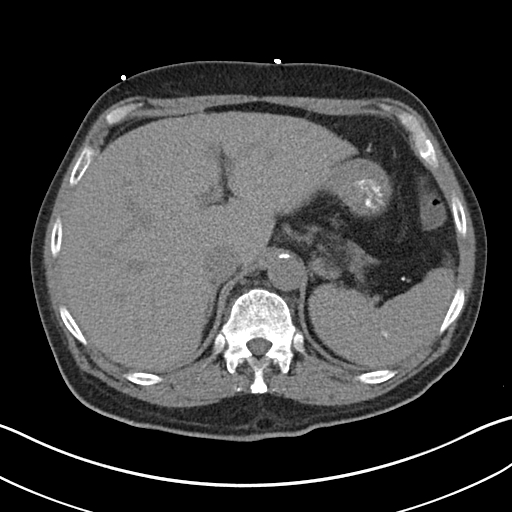
[im 84/97  soft-tissue]
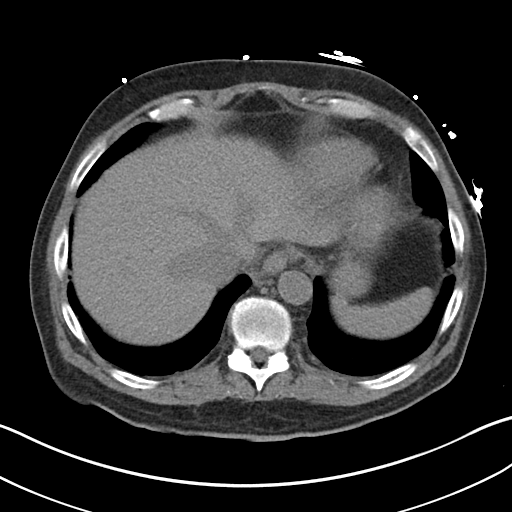
[im 92/97  soft-tissue]
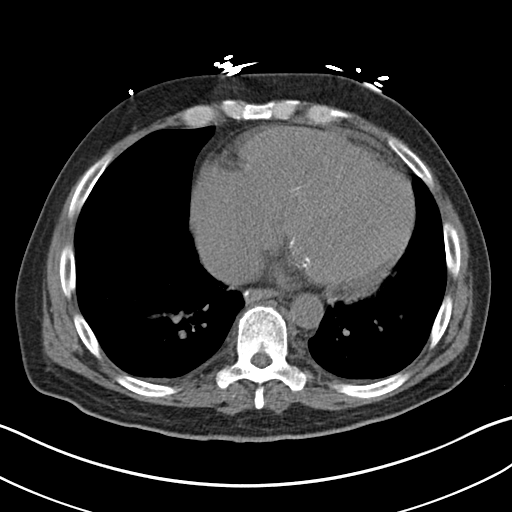

[Series 5: cor st · coronal · 0.72mm/px · 3 of 92 slices shown]
[im 31/92  soft-tissue]
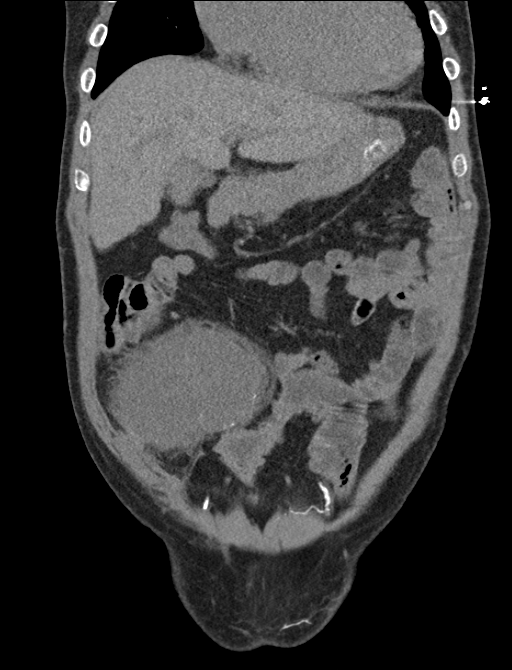
[im 41/92  soft-tissue]
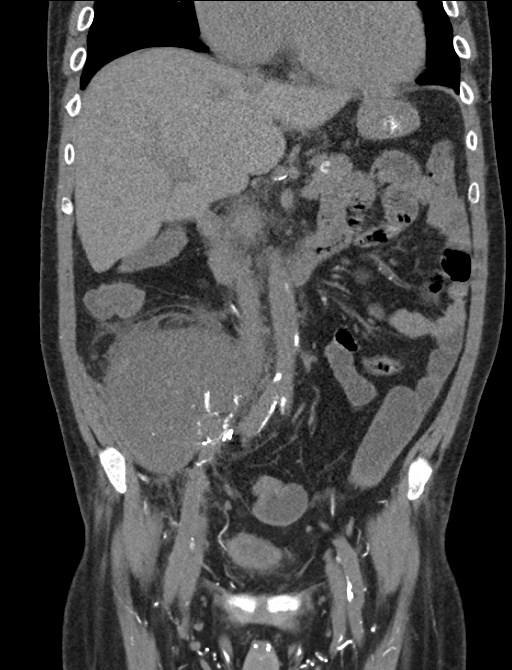
[im 51/92  soft-tissue]
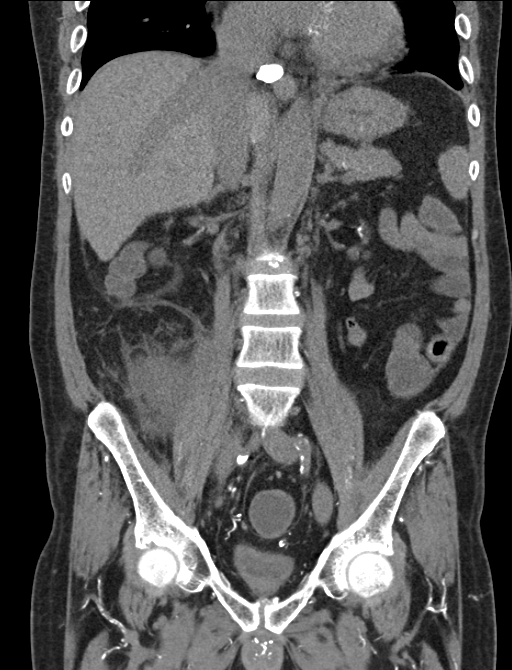

[15 of 46 positions shown; findings below may reference images not displayed]

FINDINGS: Lower chest:

Unremarkable appearance of the soft tissues of the chest wall.

Cardiomegaly. No pericardial fluid. Calcifications in the
distribution the right coronary artery.

Re- demonstration of calcified mediastinal lymph nodes.

Unremarkable appearance of the distal esophagus.

No hiatal hernia.

No confluent airspace disease, pleural fluid, or pneumothorax within
visualized lung.

Abdomen/pelvis:

Unremarkable appearance of liver and spleen.

Unremarkable appearance of bilateral adrenal glands.

No peripancreatic or pericholecystic fluid or inflammatory changes.

Mixed density material air within the gallbladder, potentially
microlithiasis.

No intrahepatic or extrahepatic biliary ductal dilatation.

No intra-peritoneal free air or significant free-fluid.

No abnormally dilated small bowel or colon. No transition point. No
inflammatory changes of the mesenteries.

Normal appendix identified.

Fluid filled sigmoid colon and rectum.

Right Kidney/Ureter:

Native right kidney atrophic with thinning of the cortex and
multiple partially calcified cysts.

Left Kidney/Ureter:

Left kidney atrophic with thinned cortex and multiple partially
calcified cysts.

Re- demonstration of right pelvic transplant kidney. Compared to the
prior CT there is now loss of definition of the renal hilar fat with
infiltration of soft tissue/inflammation. Inflammatory changes
surround the transplant kidney, with trace fluid. No evidence of
hydronephrosis. Greatest diameter on the current CT measures
cm. Previously greatest diameter 10.2 cm.

Urinary bladder relatively decompressed.

Calcifications within the infrarenal aorta and of the mesenteric
vessels. Calcifications of the renal arteries.

Small vessels of the pelvis demonstrate dense calcifications.
Calcifications of the iliac vessels and proximal femoral system.

Musculoskeletal:

No displaced fracture identified.

No significant degenerative changes of the spine.
IMPRESSION: Fluid filled colon, without associated inflammatory changes.
Findings may represent enteritis/colitis, and correlation with
patient history may be useful.

The patient has a right pelvic transplant kidney. Since the
comparison CT of 07/05/2014, there has been development of
significant inflammatory changes of the surrounding fat, as well as
loss of the hilar fat with expansion of the kidney. Findings are
nonspecific, and may represent pyelonephritis, chronic graft
rejection, or potentially post-transplant lymphoproliferative
disorder.

Aortic atherosclerosis.  Small vessel atherosclerotic.

These results were called by telephone at the time of interpretation
on 06/28/2016 at [DATE] to the Physician Assistant caring for the
patient, Mr. BOUATTOUR ALIOUI , who verbally acknowledged these results.

## 2016-08-29 ENCOUNTER — Emergency Department (HOSPITAL_COMMUNITY): Payer: Medicare Other

## 2016-08-29 ENCOUNTER — Encounter (HOSPITAL_COMMUNITY): Payer: Self-pay

## 2016-08-29 ENCOUNTER — Inpatient Hospital Stay (HOSPITAL_COMMUNITY)
Admission: EM | Admit: 2016-08-29 | Discharge: 2016-08-31 | DRG: 698 | Disposition: A | Discharge status: Home / Self Care | Payer: Medicare Other | Attending: Family Medicine | Admitting: Family Medicine

## 2016-08-29 DIAGNOSIS — I5022 Chronic systolic (congestive) heart failure: Secondary | ICD-10-CM | POA: Diagnosis present

## 2016-08-29 DIAGNOSIS — T8611 Kidney transplant rejection: Secondary | ICD-10-CM | POA: Diagnosis present

## 2016-08-29 DIAGNOSIS — R112 Nausea with vomiting, unspecified: Secondary | ICD-10-CM

## 2016-08-29 DIAGNOSIS — Z940 Kidney transplant status: Secondary | ICD-10-CM

## 2016-08-29 DIAGNOSIS — N186 End stage renal disease: Secondary | ICD-10-CM | POA: Diagnosis present

## 2016-08-29 DIAGNOSIS — Z8249 Family history of ischemic heart disease and other diseases of the circulatory system: Secondary | ICD-10-CM

## 2016-08-29 DIAGNOSIS — H109 Unspecified conjunctivitis: Secondary | ICD-10-CM | POA: Diagnosis present

## 2016-08-29 DIAGNOSIS — E8889 Other specified metabolic disorders: Secondary | ICD-10-CM | POA: Diagnosis present

## 2016-08-29 DIAGNOSIS — I132 Hypertensive heart and chronic kidney disease with heart failure and with stage 5 chronic kidney disease, or end stage renal disease: Secondary | ICD-10-CM | POA: Diagnosis present

## 2016-08-29 DIAGNOSIS — Z79899 Other long term (current) drug therapy: Secondary | ICD-10-CM

## 2016-08-29 DIAGNOSIS — I4581 Long QT syndrome: Secondary | ICD-10-CM | POA: Diagnosis present

## 2016-08-29 DIAGNOSIS — Z992 Dependence on renal dialysis: Secondary | ICD-10-CM

## 2016-08-29 DIAGNOSIS — Z7952 Long term (current) use of systemic steroids: Secondary | ICD-10-CM

## 2016-08-29 DIAGNOSIS — Z7982 Long term (current) use of aspirin: Secondary | ICD-10-CM

## 2016-08-29 DIAGNOSIS — X58XXXA Exposure to other specified factors, initial encounter: Secondary | ICD-10-CM | POA: Diagnosis present

## 2016-08-29 DIAGNOSIS — Z87891 Personal history of nicotine dependence: Secondary | ICD-10-CM

## 2016-08-29 DIAGNOSIS — I480 Paroxysmal atrial fibrillation: Secondary | ICD-10-CM | POA: Diagnosis present

## 2016-08-29 LAB — GLUCOSE, CAPILLARY: Glucose-Capillary: 79 mg/dL (ref 65–99)

## 2016-08-29 LAB — COMPREHENSIVE METABOLIC PANEL WITH GFR
ALT: 8 U/L — ABNORMAL LOW (ref 17–63)
AST: 16 U/L (ref 15–41)
Albumin: 3.1 g/dL — ABNORMAL LOW (ref 3.5–5.0)
Alkaline Phosphatase: 129 U/L — ABNORMAL HIGH (ref 38–126)
Anion gap: 16 — ABNORMAL HIGH (ref 5–15)
BUN: 18 mg/dL (ref 6–20)
CO2: 23 mmol/L (ref 22–32)
Calcium: 7.5 mg/dL — ABNORMAL LOW (ref 8.9–10.3)
Chloride: 96 mmol/L — ABNORMAL LOW (ref 101–111)
Creatinine, Ser: 9.1 mg/dL — ABNORMAL HIGH (ref 0.61–1.24)
GFR calc Af Amer: 7 mL/min — ABNORMAL LOW
GFR calc non Af Amer: 6 mL/min — ABNORMAL LOW
Glucose, Bld: 108 mg/dL — ABNORMAL HIGH (ref 65–99)
Potassium: 4.2 mmol/L (ref 3.5–5.1)
Sodium: 135 mmol/L (ref 135–145)
Total Bilirubin: 1.7 mg/dL — ABNORMAL HIGH (ref 0.3–1.2)
Total Protein: 8.1 g/dL (ref 6.5–8.1)

## 2016-08-29 LAB — CBC
HEMATOCRIT: 35.1 % — AB (ref 39.0–52.0)
HEMOGLOBIN: 10.8 g/dL — AB (ref 13.0–17.0)
MCH: 26.8 pg (ref 26.0–34.0)
MCHC: 30.8 g/dL (ref 30.0–36.0)
MCV: 87.1 fL (ref 78.0–100.0)
Platelets: 229 10*3/uL (ref 150–400)
RBC: 4.03 MIL/uL — ABNORMAL LOW (ref 4.22–5.81)
RDW: 19.2 % — AB (ref 11.5–15.5)
WBC: 6.9 10*3/uL (ref 4.0–10.5)

## 2016-08-29 LAB — PROTIME-INR
INR: 1.32
PROTHROMBIN TIME: 16.5 s — AB (ref 11.4–15.2)

## 2016-08-29 LAB — I-STAT CG4 LACTIC ACID, ED: LACTIC ACID, VENOUS: 1.04 mmol/L (ref 0.5–1.9)

## 2016-08-29 MED ORDER — IOPAMIDOL (ISOVUE-300) INJECTION 61%
INTRAVENOUS | Status: AC
  Filled 2016-08-29: qty 30

## 2016-08-29 MED ORDER — METHYLPREDNISOLONE SODIUM SUCC 125 MG IJ SOLR
125.0000 mg | Freq: Once | INTRAMUSCULAR | Status: AC
Start: 2016-08-29 — End: 2016-08-29
  Administered 2016-08-29: 125 mg via INTRAVENOUS
  Filled 2016-08-29: qty 2

## 2016-08-29 MED ORDER — PROCHLORPERAZINE EDISYLATE 5 MG/ML IJ SOLN
10.0000 mg | Freq: Four times a day (QID) | INTRAMUSCULAR | Status: DC | PRN
Start: 2016-08-29 — End: 2016-08-31

## 2016-08-29 MED ORDER — NEPRO/CARBSTEADY PO LIQD: 237.0000 mL | Freq: Two times a day (BID) | ORAL | Status: DC

## 2016-08-29 MED ORDER — ONDANSETRON HCL 4 MG/2ML IJ SOLN
4.0000 mg | Freq: Once | INTRAMUSCULAR | Status: AC
  Administered 2016-08-29: 4 mg via INTRAVENOUS
  Filled 2016-08-29: qty 2

## 2016-08-29 MED ORDER — METHYLPREDNISOLONE SODIUM SUCC 125 MG IJ SOLR
125.0000 mg | Freq: Once | INTRAMUSCULAR | Status: AC
  Administered 2016-08-30: 125 mg via INTRAVENOUS
  Filled 2016-08-29: qty 2

## 2016-08-29 NOTE — ED Triage Notes (Signed)
Patient complains of RLQ pain with fatigue, low grade fever, nausea and vomiting since Thursday. Patient pale on arrival. States that he has chronic low calcium and thinks some of this related. Patient alert and oriented, last dialysis saturday

## 2016-08-29 NOTE — ED Provider Notes (Signed)
[facility] DEPT Provider Note   CSN: [id number] Arrival date & time: 08/29/16  1312  History   Chief Complaint Chief Complaint  Patient presents with  . Fatigue  . Nausea    HPI Samuel Rowland is a 55 y.o. male.  HPI  55 y.o. male with a hx of ESRD on Dialysis (Tuesday, Thursday, Saturday), HTN, Paroxsysmal Afib, Right Renal Transplant, Hypocalcemia s/p parathyroidectomy presents to the Emergency Department today complaining of N/V, generalized fatigue since Thursday of last week. No associated URI symptoms. No cough. No fevers at home. Pt states it feels the same as last time he was admitted in August. Pt follow up with his scheduled appointment with Urology, Nephrology and scheduled Dialysis sessions. Currently on prednisone due to renal transplant. Per patient, there are discussions about removing transplant. Completed Last Dialysis on Saturday. For the past 5 days, the patient has been unable to take his home PO medication. Pt is on coumadin. Does not produce urine. No other symptoms noted.   08-05-16--N/V likely attributed to allograft intolerance. Continue Prednisone. He is on 89m Daily  Past Medical History:  Diagnosis Date  . Anemia, chronic renal failure   . Coronary artery disease cardiologist -- dr jMartinique  mild minimal nonobstrucive CAD per cath 12-23-2015  . ESRD (end stage renal disease) on dialysis ([facility]    [facility] (11/12/2015)  after failed kidney transplant 11/ 2010  . GERD (gastroesophageal reflux disease)   . Heart murmur   . History of acute prostatitis    11/ 2015  . History of esophagitis    2010  . History of hiatal hernia   . History of lower GI bleeding    2010 secondary to hemorrhoids  . History of metabolic acidosis    117/25102 . Hypertension   . Kidney transplant as cause of abnormal reaction or later complication    [facility] . Nausea and vomiting    chronic  . Nonischemic  cardiomyopathy ([place]   . Paroxysmal atrial fibrillation with RVR ([facility] [place]  [place]-  s/p  x2 cardioversions 12/ 2016  . Systolic CHF, chronic ([facility]     Patient Active Problem List   Diagnosis Date Noted  . Renal transplant failure and rejection   . ESRD (end stage renal disease) ([facility] 06/29/2016  . Malnutrition of moderate degree 06/29/2016  . Abdominal pain   . Heel ulcer ([facility]   . Chronic systolic congestive heart failure ([facility]   . Hyperkalemia 06/28/2016  . Hypocalcemia 05/31/2016  . Postsurgical hypoparathyroidism ([facility] 05/31/2016  . Penile ulcer 05/31/2016  . 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 Scrotal abscess 02/26/2016  . Sebaceous cyst of scrotum 02/26/2016  . Cellulitis, scrotum 02/22/2016  . Abnormal nuclear stress test 12/15/2015  . ESRD on dialysis ([facility] 12/15/2015  . Other hypertrophic cardiomyopathy ([facility] 11/26/2015  . Atrial fibrillation ([place] [I48.91] 11/17/2015  . Long term (current) use of anticoagulants [Z79.01] 11/17/2015  . Paroxysmal a-fib ([facility] [facility] 11/12/2015  . Anemia of chronic kidney failure 11/12/2015  . 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 Acute kidney injury ([facility]   . Essential hypertension   . Metabolic acidosis 158/52/7782 . Protein-calorie malnutrition, severe ([facility] 09/20/2014  . AKI (acute kidney injury) ([facility] 09/20/2014  . Renal transplant recipient 09/20/2014  . Prostatitis, acute 09/19/2014    Past Surgical History:  Procedure Laterality Date  .  ANTERIOR CERVICAL DECOMP/DISCECTOMY FUSION  03/ 2000   C5-6   . BACK SURGERY    . CARDIAC CATHETERIZATION N/A 12/23/2015   Procedure: Left Heart Cath and Coronary Angiography;  Surgeon: Peter M Martinique, MD;  Location: [facility] CV LAB;  Service: Cardiovascular;  Laterality: N/A;  abnormal nuclear test/  moderate single vessel CAD involving first diagonal (70%);  normal LV filling pressures  . CARDIOVASCULAR STRESS TEST   12-09-2015  dr Martinique   Intermediate risk nuclear study w/ medium size, moderate intensity, partially reversible inferior defect consistent w/ probable thinning VS. small prior infarct, minimal inferor ishcemia, LVEF moderately decreased ef 41% (30-44%) w/ global hypokinesis, severe LVE (findings likely suggest nonischemic cardiomyopathy  . CARDIOVERSION  x2   12/ 2016   one at [facility] and one at [facility] ED  . COLONOSCOPY W/ POLYPECTOMY  10/2009  . ESOPHAGOGASTRODUODENOSCOPY  10/2009  . KIDNEY TRANSPLANT Right 09/2009   "[facility]"  . RENAL BIOPSY  2015       Home Medications    Prior to Admission medications   Medication Sig Start Date End Date Taking? Authorizing Provider  amiodarone (PACERONE) 400 MG tablet Take 1 tablet (400 mg total) by mouth daily. 12/15/15   Peter M Martinique, MD  aspirin 325 MG tablet Take 325 mg by mouth daily.    Historical Provider, MD  calcitRIOL (ROCALTROL) 1 MCG/ML solution Take 1 mcg by mouth at bedtime. 06/24/16   Historical Provider, MD  calcium acetate (PHOSLO) 667 MG capsule Take 667 mg by mouth 3 (three) times daily with meals.    Historical Provider, MD  calcium carbonate (TUMS - DOSED IN MG ELEMENTAL CALCIUM) 500 MG chewable tablet Chew 4 tablets by mouth 3 (three) times daily with meals.    Historical Provider, MD  HYDROcodone-acetaminophen (NORCO/VICODIN) 5-325 MG tablet Take 1 tablet by mouth every 6 (six) hours as needed for moderate pain. Patient not taking: Reported on 08/11/2016 02/26/16   Nishant Dhungel, MD  lidocaine (XYLOCAINE) 2 % jelly Place 1 application into the urethra as needed (for pain).     Historical Provider, MD  metoprolol tartrate (LOPRESSOR) 25 MG tablet Take 1 tablet (25 mg total) by mouth 2 (two) times daily. 11/13/15   Jule Ser, DO  oxyCODONE-acetaminophen (PERCOCET/ROXICET) 5-325 MG tablet Take 2 tablets by mouth every 4 (four) hours as needed for severe pain. Patient not taking: Reported on 08/11/2016 05/28/16   Dondra Spry Dowless, PA-C  predniSONE (DELTASONE) 20 MG tablet Take 2 tablets (40 mg total) by mouth daily with breakfast. 07/01/16   Veatrice Bourbon, MD  warfarin (COUMADIN) 2 MG tablet Take 1 tablet by mouth daily or as directed by coumadin clinic Patient not taking: Reported on 08/11/2016 03/28/16   Peter M Martinique, MD    Family History Family History  Problem Relation Age of Onset  . Hypertension Father     Social History Social History  Substance Use Topics  . Smoking status: Former Smoker    Years: 22.00    Types: Cigars  . Smokeless tobacco: Never Used     Comment: "stopped smoking in 1998"  . Alcohol use No     Allergies   No known allergies   Review of Systems Review of Systems ROS reviewed and all are negative for acute change except as noted in the HPI.  Physical Exam Updated Vital Signs BP 147/89   Pulse 73   Temp 99.2 F (37.3 C) (Oral)   Resp 17  SpO2 97%   Physical Exam  Constitutional: He is oriented to person, place, and time. Vital signs are normal. He appears well-developed and well-nourished.  HENT:  Head: Normocephalic and atraumatic.  Right Ear: Hearing normal.  Left Ear: Hearing normal.  Eyes: Conjunctivae and EOM are normal. Pupils are equal, round, and reactive to light.  Neck: Normal range of motion. Neck supple.  Cardiovascular: Normal rate, regular rhythm, normal heart sounds and intact distal pulses.   Pulmonary/Chest: Effort normal and breath sounds normal.  Abdominal: Soft. Normal appearance and bowel sounds are normal. There is tenderness (area hard on palpation) in the right lower quadrant.  Genitourinary:  Genitourinary Comments: Calcium deposition on scrotum  Musculoskeletal:  Left AV Fistula palpable with good thrill. No erythema or signs of infection.   Neurological: He is alert and oriented to person, place, and time.  Skin: Skin is warm and dry.  Psychiatric: He has a normal mood and affect. His speech is normal and behavior is  normal. Thought content normal.  Nursing note and vitals reviewed.  ED Treatments / Results  Labs (all labs ordered are listed, but only abnormal results are displayed) Labs Reviewed  CBC - Abnormal; Notable for the following:       Result Value   RBC 4.03 (*)    Hemoglobin 10.8 (*)    HCT 35.1 (*)    RDW 19.2 (*)    All other components within normal limits  COMPREHENSIVE METABOLIC PANEL - Abnormal; Notable for the following:    Chloride 96 (*)    Glucose, Bld 108 (*)    Creatinine, Ser 9.10 (*)    Calcium 7.5 (*)    Albumin 3.1 (*)    ALT 8 (*)    Alkaline Phosphatase 129 (*)    Total Bilirubin 1.7 (*)    GFR calc non Af Amer 6 (*)    GFR calc Af Amer 7 (*)    Anion gap 16 (*)    All other components within normal limits  PROTIME-INR - Abnormal; Notable for the following:    Prothrombin Time 16.5 (*)    All other components within normal limits  URINALYSIS, ROUTINE W REFLEX MICROSCOPIC (NOT AT [facility])  I-STAT CG4 LACTIC ACID, ED    EKG  EKG Interpretation None       Radiology Ct Abdomen Pelvis Wo Contrast  Result Date: 08/29/2016 CLINICAL DATA:  Right lower quadrant pain with nausea and vomiting. End-stage renal disease. Kidney transplant. EXAM: CT ABDOMEN AND PELVIS WITHOUT CONTRAST TECHNIQUE: Multidetector CT imaging of the abdomen and pelvis was performed following the standard protocol without IV contrast. COMPARISON:  CT scans dated 06/28/2016 and 07/05/2014 FINDINGS: Lower chest: Chronic cardiomegaly. Calcified lymph nodes in the middle mediastinum, stable. Hepatobiliary: Small calcified granulomas in the liver. Sludge or tiny stones in the gallbladder. The gallbladder wall is not thickened and there are no dilated bile ducts. Pancreas: Normal. Spleen: Normal. Adrenals/Urinary Tract: Chronic atrophy of both kidneys with extensive calcifications in both kidneys, stable. Marked enlargement and inhomogeneous density of the right transplant kidney. Soft tissue stranding  around the kidney persists. No appreciable dilatation of the renal collecting system. The soft tissue stranding around the transplant has slightly increased. The differential diagnosis remains pyelonephritis, chronic graft rejection or posttransplant lymphoproliferative disorder. Stomach/Bowel: Normal. Vascular/Lymphatic: Aortic atherosclerosis. Numerous small periaortic lymph nodes which are unchanged and felt to be benign reactive nodes. Reproductive: No acute abnormality. Extensive vascular calcifications throughout the pelvis. Other: No free air. Musculoskeletal: No  significant abnormality. IMPRESSION: Persistent and slightly progressed perinephric inflammation around the diffusely edematous transplant kidney in the right lower quadrant. Differential diagnosis remains pyelonephritis, chronic graft rejection, or post transplant lymphoproliferative disorder. Aortic atherosclerosis. Electronically Signed   By: Lorriane Shire M.D.   On: 08/29/2016 17:35   Procedures Procedures (including critical care time)  Medications Ordered in ED Medications - No data to display  Initial Impression / Assessment and Plan / ED Course  I have reviewed the triage vital signs and the nursing notes.  Pertinent labs & imaging results that were available during my care of the patient were reviewed by me and considered in my medical decision making (see chart for details).  Clinical Course   Final Clinical Impressions(s) / ED Diagnoses  I have reviewed and evaluated the relevant laboratory values I have reviewed and evaluated the relevant imaging studies.  I have reviewed the relevant previous healthcare records. I obtained HPI from historian. Patient discussed with supervising physician  ED Course:  Assessment: Pt is a 34yM with hx ESRD on Dialysis (Tuesday, Thursday, Saturday), HTN, Paroxsysmal Afib, Right Renal Transplant, Hypocalcemia s/p parathyroidectomy who presents with N/V and fatigue since Thursday. Notes  feeling similar on previous admission. Has RLQ pain where renal transplant is located. No fevers at home. Has not taken PO medications x5 days. On exam, pt in NAD. Nontoxic/nonseptic appearing. VSS. Afebrile. Lungs CTA. Heart RRR. Abdomen with hard mass on RLQ. Likely transplant. Labs Cr 9.10. Calcium 7.5. Anion Gap 16. No Leukocytosis. Lactate ~1. INR not at therapeutic level. CT Abdomen/pelvis with worsening perinephric inflammation around transplant kidney. No indication for infection. Does not meet SIRS criteria. Review of Notes with Nephrology notes N/V could possible be due to association with renal transplant rejection. Discussed with supervising physician. Plan is to Admit to family medicine.   Family Medicine Doc- Arlyss Queen, MD   Disposition/Plan:  Admit Pt acknowledges and agrees with plan  Supervising Physician Deno Etienne, DO   Final diagnoses:  Non-intractable vomiting with nausea, unspecified vomiting type  Renal transplant rejection    New Prescriptions New Prescriptions   No medications on file     Shary Decamp, PA-C 08/29/16 [place], DO 08/29/16 1851

## 2016-08-29 NOTE — ED Notes (Signed)
Tyler PA at bedside   

## 2016-08-29 NOTE — ED Notes (Signed)
Report given to 6E RN patient stable for transport at this time.

## 2016-08-29 NOTE — H&P (Signed)
[facility] Admission History and Physical Service Pager: [phone]  Patient name: Samuel Rowland Medical record number: [id number] Date of birth: November 28, 1960 Age: 55 y.o. Gender: male  Primary Care Provider: Jenny Reichmann, MD Consultants: Nephrology Code Status: Full code  Chief Complaint: fatigue and nausea  Assessment and Plan: [name] is a 55 y.o. male presenting with fatigue and nausea. PMH is significant for hypertension, paroxysmal A. fib, end-stage renal disease on dialysis, right renal transplant, hypercalcemia status post parathyroidectomy  Abdominal pain and nausea Picture is consistent with previous admissions for subacute transplant rejection, however patient is afebrile, but reported fevers to 101 at home. No leukocytosis.  Differential diagnosis remains pyelonephritis, chronic graft rejection, or post transplant lymphoproliferative disorder. CT abdomen showed persistent and slightly progressed perinephric inflammation around the transplanted kidney in RLQ. Patient afebrile and without leukocytosis at admission. Picture most consistent with acute transplant rejection.  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 T/TH/Sat dialysis.  Will receive dialysis tomorrow.  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 Looks stable from last admission. - consider wound care c/s - lidocaine gel PRN pain and triple antibiotic ointment  -patient was not taking  narcotics at admission, pain control as needed   Hypocalcemia Calcium 7.5 (8.2 corrected) upon admission. Corrected Ca goal of 8-9 and uncorrected goal of 7-8 in setting of calcific uremic arteriolopathy. Patient underwent a parathyroidectomy on 6/28 with removal of 3 and 1/2 glands.  Hypocalcemia most likely 2/2 parathyroidectomy and inability to take meds.  - Nephrology consulted for HD, appreciate assistance. - Follow renal panel  - calcitriol daily - Tums 800104metween meals and qHS   Paroxysmal Atrial Fibrillation. Chadsvasc1. 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  Stable - Metoprolol 25 mg - Continue to monitor  HFrEF: TEE 12/16 showed EF 30-35%, right ventricle mildly dilated, diffuse calcification of aortic valve, aortic valve sclerosis, moderate TR. No signs of volume overload at admission.   Conjunctivitis: Bilateral. Appears concerning for bacterial infection given history and exam findings of purulent drainage. Was seen at urgent care and given trimethoprim-polymyxin.  -trimethoprim-polymyxin ordered, if patient does not experience relief with this could try alternate anti-bacterial agent and other supportive care measures such as lubricating eye drops   Qtc Prolongation: Present at last admission with Qtc of 557.  -avoid QTc prolonging medications such as Zofran -repeat EKG in AM   FEN/GI: renal diet; SLIV  PPx: Heparin   Disposition: admit to observation under attending [place] Present Illness:  Samuel Rollyson55 y.o51male with history of right kidney transplant rejection, end-stage renal disease on dialysis and calcific uremic arteriolopathy presenting with nausea, vomiting, fevers and chills for 3-4 days.   He was recently admitted in mid August for a similar picture.  At the time of discharge  he was recommended to follow up with his primary nephrologist, [facility]  There was a strong suggestion by Dr. Roney Jaffe, that the patient should consider nephrectomy of the transplanted kidney given the rejection. Patient is presenting with nausea and vomiting 3 days, fatigue, chills and fever to 101. He receives dialysis 3 times weekly on Tuesdays Thursdays and Saturdays and reports good compliance. He stated that his wife made him come into the hospital.  Phenergan typically works for vomiting at home, but he ran out several days ago.  During last admission he was started on sodium thiosulfate to help with the calciphylaxis, however he feels that this is what causes him to be nauseous.  Also continues to complain of penile lesion and scrotal pain from calciphylaxis.   No tobacco, alcohol or drugs.   Review Of Systems: Per HPI with the following additions: No SOB or chest pain.   ROS  Patient Active Problem List   Diagnosis Date Noted  . Renal transplant rejection 08/29/2016  . Renal transplant failure and rejection   . ESRD (end stage renal disease) ([place]) 06/29/2016  . Malnutrition of moderate degree 06/29/2016  . Abdominal pain   . Heel ulcer ([place])   . Chronic systolic congestive heart failure ([place])   . Hyperkalemia 06/28/2016  . Hypocalcemia 05/31/2016  . Postsurgical hypoparathyroidism ([place]) 05/31/2016  . Penile ulcer 05/31/2016  . 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 Scrotal abscess 02/26/2016  . Sebaceous cyst of scrotum 02/26/2016  . Cellulitis, scrotum 02/22/2016  . Abnormal nuclear stress test 12/15/2015  . ESRD on dialysis ([place]) 12/15/2015  . Other hypertrophic cardiomyopathy ([place]) 11/26/2015  . Atrial fibrillation ([place]) [I48.91] 11/17/2015  . Long term (current) use of anticoagulants [Z79.01] 11/17/2015  . Paroxysmal a-fib ([place]) 11/12/2015  . Anemia of chronic kidney failure 11/12/2015  . Kidney disease, chronic, stage IV (severe, EGFR 15-29 ml/min) (HCC)  04/13/2015  . Acute kidney injury ([place])   . Essential hypertension   . Metabolic acidosis 94/58/5929  . Protein-calorie malnutrition, severe ([place]) 09/20/2014  . AKI (acute kidney injury) ([place]) 09/20/2014  . Renal transplant recipient 09/20/2014  . Prostatitis, acute 09/19/2014    Past Medical History: Past Medical History:  Diagnosis Date  . Anemia, chronic renal failure   . Coronary artery disease cardiologist -- dr Martinique   mild minimal nonobstrucive CAD per cath 12-23-2015  . ESRD (end stage renal disease) on dialysis [facility])    "[facility]; TTS since ~10/2014" (11/12/2015)  after failed kidney transplant 11/ 2010  . GERD (gastroesophageal reflux disease)   . Heart murmur   . History of acute prostatitis    11/ 2015  . History of esophagitis    2010  . History of hiatal hernia   . History of lower GI bleeding    2010 secondary to hemorrhoids  . History of metabolic acidosis    24/ 4628  . Hypertension   . Kidney transplant as cause of abnormal reaction or later complication    transplant done 11/ 2010 at [facility]  . Nausea and vomiting    chronic  . Nonischemic cardiomyopathy ([place])   . Paroxysmal atrial fibrillation with RVR [facility]) dx 12/ 2016  at [place]-  dr Martinique--  s/p  x2 cardioversions 12/ 2016  . Systolic CHF, chronic ([place])     Past Surgical History: Past Surgical History:  Procedure Laterality Date  . ANTERIOR CERVICAL DECOMP/DISCECTOMY FUSION  03/ 2000   C5-6   . BACK SURGERY    . CARDIAC CATHETERIZATION N/A 12/23/2015   Procedure:  Left Heart Cath and Coronary Angiography;  Surgeon: Peter M Martinique, MD;  Location: [place] CV LAB;  Service: Cardiovascular;  Laterality: N/A;  abnormal nuclear test/  moderate single vessel CAD involving first diagonal (70%);  normal LV filling pressures  . CARDIOVASCULAR STRESS TEST  12-09-2015  dr Martinique   Intermediate risk nuclear study w/ medium size, moderate intensity, partially reversible  inferior defect consistent w/ probable thinning VS. small prior infarct, minimal inferor ishcemia, LVEF moderately decreased ef 41% (30-44%) w/ global hypokinesis, severe LVE (findings likely suggest nonischemic cardiomyopathy  . CARDIOVERSION  x2   12/ 2016   one at [facility] and one at [facility] ED  . COLONOSCOPY W/ POLYPECTOMY  10/2009  . ESOPHAGOGASTRODUODENOSCOPY  10/2009  . KIDNEY TRANSPLANT Right 09/2009   "[facility]"  . RENAL BIOPSY  2015    Social History: Social History  Substance Use Topics  . Smoking status: Former Smoker    Years: 22.00    Types: Cigars  . Smokeless tobacco: Never Used     Comment: "stopped smoking in 1998"  . Alcohol use No    Family History: Family History  Problem Relation Age of Onset  . Hypertension Father      Allergies and Medications: Allergies  Allergen Reactions  . No Known Allergies    No current facility-administered medications on file prior to encounter.    Current Outpatient Prescriptions on File Prior to Encounter  Medication Sig Dispense Refill  . amiodarone (PACERONE) 400 MG tablet Take 1 tablet (400 mg total) by mouth daily. (Patient taking differently: Take 200 mg by mouth at bedtime. ) 30 tablet 11  . aspirin 325 MG tablet Take 325 mg by mouth at bedtime.     . calcium carbonate (TUMS - DOSED IN MG ELEMENTAL CALCIUM) 500 MG chewable tablet Chew 4 tablets by mouth 3 (three) times daily after meals.     . lidocaine (XYLOCAINE) 2 % jelly Place 1 application into the urethra 2 (two) times daily as needed (pain).     . metoprolol tartrate (LOPRESSOR) 25 MG tablet Take 1 tablet (25 mg total) by mouth 2 (two) times daily. 60 tablet 0  . HYDROcodone-acetaminophen (NORCO/VICODIN) 5-325 MG tablet Take 1 tablet by mouth every 6 (six) hours as needed for moderate pain. (Patient not taking: Reported on 08/29/2016) 20 tablet 0  . oxyCODONE-acetaminophen (PERCOCET/ROXICET) 5-325 MG tablet Take 2 tablets by mouth every 4 (four) hours as needed  for severe pain. (Patient not taking: Reported on 08/29/2016) 15 tablet 0  . predniSONE (DELTASONE) 20 MG tablet Take 2 tablets (40 mg total) by mouth daily with breakfast. (Patient not taking: Reported on 08/29/2016) 60 tablet 3  . warfarin (COUMADIN) 2 MG tablet Take 1 tablet by mouth daily or as directed by coumadin clinic (Patient not taking: Reported on 08/29/2016) 30 tablet 1    Objective: BP 161/80   Pulse 76   Temp 99.2 F (37.3 C) (Oral)   Resp 15   SpO2 99%  Exam: General:  55yo ill appearing male lying in bed in NAD Eyes: bilateral conjunctivitis with purulent discharge in left eye, PERRL, EOMI  Neck: Scar present from parathyroidectomy Cardiovascular: RRR, 2/6 holosystolic murmur, no gallops or rubs, left AV fistula with palpable thrill  Respiratory: normal WOB, CTABL, no wheezing or rhonchi.  Gastrointestinal: +BS, soft, moderately tender on right side with palpable large mass, no distension.  MSK: moves all limbs spontaneously Derm: calcium deposits in legs and scrotum Neuro: AAOx3 Psych: normal  mood and affect  Labs and Imaging: CBC BMET   Recent Labs Lab 08/29/16 1323  WBC 6.9  HGB 10.8*  HCT 35.1*  PLT 229    Recent Labs Lab 08/29/16 1323  NA 135  K 4.2  CL 96*  CO2 23  BUN 18  CREATININE 9.10*  GLUCOSE 108*  CALCIUM 7.5*      Eloise Levels, MD 08/29/2016, 7:08 PM PGY-1, [place] Intern pager: [phone], text pages welcome  Upper Level Addendum:  I have seen and evaluated this patient along with Dr. Rosalyn Gess and reviewed the above note, making necessary revisions in green.   Phill Myron, D.O. 08/30/2016, 6:24 AM PGY-2, [place]

## 2016-08-29 NOTE — Consult Note (Signed)
Reason for Consult: ESRD Referring Physician: Dr. Rosana Berger  Chief Complaint: Nausea and abdominal pain  Assessment/Plan: 1 Acute transplant rejection - Certainly possible to have lymphoproliferative disorder but presentation is consistent with rejection. This kidney is not serving any purpose and with each rejection (q2-3 mths) he's receiving the equivalent of ~27mhs of steroids that his body produces. This is obviously not the kidney to perform a transplant nephrectomy with acute rejection + wound healing issues with the calciphylaxis. But once the calciphylaxis is under reasonable control (has already had a PTX) the transplant kidney needs to come out. Certainly a risky endeavor bec of the vascular nature of the transplant and sometimes can be a difficult dissection but frequent rejection in a failed transplant is an absolute indication for a nephrectomy. - Will pulse him with Solumedrol 1255magain tomorrow AM (#2) and will likely need a 3rd pulse followed by 40-6064maily with taper as an outpatient. - He seems to flare up when the prednisone is tapered below 61m13mily but I agree that he can't stay on 61mg67mefinitely which is why the transplant kidney needs to come out. 2 ESRD: Last HD Sat (full treatment and he left 3kg under EDW). Will HD in the AM with only 1L UF as tolerated; his appetite and intake has been very poor. Usually EDW is 168 lb and he is already below his EDW. 3 Hypertension: Controlled 4. Anemia of ESRD: Stable 5. Metabolic Bone Disease: PTX 04/2016 w/ hypocalcemia. 6. Calciphylaxis - on Thiosulfate for almost 2 mths; he needs Phenergan with as the Thiosulfate makes him nauseous.  Dialyzes at [facility] [address] Nephrologist Dr. PirklJolinda Croak168 lb Access Lt BCF  HPI: Samuel Rowland 55 y.53 male with a h/o ESRD w/ a failed right sided kidney transplant which has lead to q2-3 mth rejections for the past 2 years. He also has biopsy proven calciphylaxis on Sodium  Thiosulfate for the past 2 mths --> nausea for which he takes Phenergan. He is presenting with nausea, anorexia, fevers, body aches but worse over the transplanted kidney since Thursday. He denies any diarrhea, no longer makes urine, denies a sore throat or cough. He also denies dyspnea or chest pain. His last dialysis treatment was on Sat for which he was able to complete a full treatment. [facility]zes at [facility] w/ Dr. PirklJolinda Croak a EDW of 168 lb but he left Sat at 165 lb. He's been told @ [facility] they are hesitant to remove the transplanted kidney bec of the high risk of bleeding and now wound healing given the calciphylaxis.     Past Medical History:  Diagnosis Date  . Anemia, chronic renal failure   . Coronary artery disease cardiologist -- dr jordaMartiniqueld minimal nonobstrucive CAD per cath 12-23-2015  . ESRD (end stage renal disease) on dialysis (HCC)[facility] [place] [facility] since ~10/2014" (11/12/2015)  after failed kidney transplant 11/ 2010  . GERD (gastroesophageal reflux disease)   . Heart murmur   . History of acute prostatitis    11/ 2015  . History of esophagitis    2010  . History of hiatal hernia   . History of lower GI bleeding    2010 secondary to hemorrhoids  . History of metabolic acidosis    11/ 232/ 2025ypertension   . Kidney transplant as cause of abnormal reaction or later complication    transplant  done 11/ 2010 at [facility]  . Nausea and vomiting    chronic  . Nonischemic cardiomyopathy ([place])   . Paroxysmal atrial fibrillation with RVR [facility]) dx 12/ 2016  at [place]-  dr Martinique--  s/p  x2 cardioversions 12/ 2016  . Systolic CHF, chronic (HCC)     Medications: I have reviewed the patient's current medications.  Allergies:  Allergies  Allergen Reactions  . No Known Allergies     Past Surgical History:  Procedure Laterality Date  . ANTERIOR CERVICAL DECOMP/DISCECTOMY FUSION  03/ 2000   C5-6   .  BACK SURGERY    . CARDIAC CATHETERIZATION N/A 12/23/2015   Procedure: Left Heart Cath and Coronary Angiography;  Surgeon: Peter M Martinique, MD;  Location: [place] CV LAB;  Service: Cardiovascular;  Laterality: N/A;  abnormal nuclear test/  moderate single vessel CAD involving first diagonal (70%);  normal LV filling pressures  . CARDIOVASCULAR STRESS TEST  12-09-2015  dr Martinique   Intermediate risk nuclear study w/ medium size, moderate intensity, partially reversible inferior defect consistent w/ probable thinning VS. small prior infarct, minimal inferor ishcemia, LVEF moderately decreased ef 41% (30-44%) w/ global hypokinesis, severe LVE (findings likely suggest nonischemic cardiomyopathy  . CARDIOVERSION  x2   12/ 2016   one at [facility] and one at [facility] ED  . COLONOSCOPY W/ POLYPECTOMY  10/2009  . ESOPHAGOGASTRODUODENOSCOPY  10/2009  . KIDNEY TRANSPLANT Right 09/2009   "[facility]"  . RENAL BIOPSY  2015    Family History  Problem Relation Age of Onset  . Hypertension Father     Social History:  reports that he has quit smoking. His smoking use included Cigars. He quit after 22.00 years of use. He has never used smokeless tobacco. He reports that he does not drink alcohol or use drugs.  ROS Pertinent items are noted in HPI.  Blood pressure 128/72, pulse 79, temperature 100 F (37.8 C), temperature source Oral, resp. rate 16, height 5' 9"  (1.753 m), weight 75.4 kg (166 lb 3.6 oz), SpO2 96 %. General appearance: alert, cooperative and appears stated age Head: Normocephalic, without obvious abnormality, atraumatic Eyes: negative Neck: no adenopathy, no carotid bruit, no JVD, supple, symmetrical, trachea midline and thyroid not enlarged, symmetric, no tenderness/mass/nodules Back: negative, symmetric, no curvature. ROM normal. No CVA tenderness. Resp: clear to auscultation bilaterally Chest wall: no tenderness Cardio: regular rate and rhythm, S1, S2 normal, no murmur, click, rub or  gallop GI: tender in RLQ Extremities: extremities normal, atraumatic, no cyanosis or edema Pulses: 2+ and symmetric Great bruit in the left BCF Skin: Lesions in calves and penis Lymph nodes: Cervical, supraclavicular, and axillary nodes normal. Neurologic: Grossly normal  Results for orders placed or performed during the hospital encounter of 08/29/16 (from the past 48 hour(s))  CBC     Status: Abnormal   Collection Time: 08/29/16  1:23 PM  Result Value Ref Range   WBC 6.9 4.0 - 10.5 K/uL   RBC 4.03 (L) 4.22 - 5.81 MIL/uL   Hemoglobin 10.8 (L) 13.0 - 17.0 g/dL   HCT 35.1 (L) 39.0 - 52.0 %   MCV 87.1 78.0 - 100.0 fL   MCH 26.8 26.0 - 34.0 pg   MCHC 30.8 30.0 - 36.0 g/dL   RDW 19.2 (H) 11.5 - 15.5 %   Platelets 229 150 - 400 K/uL  Comprehensive metabolic panel     Status: Abnormal   Collection Time: 08/29/16  1:23 PM  Result Value  Ref Range   Sodium 135 135 - 145 mmol/L   Potassium 4.2 3.5 - 5.1 mmol/L   Chloride 96 (L) 101 - 111 mmol/L   CO2 23 22 - 32 mmol/L   Glucose, Bld 108 (H) 65 - 99 mg/dL   BUN 18 6 - 20 mg/dL   Creatinine, Ser 9.10 (H) 0.61 - 1.24 mg/dL   Calcium 7.5 (L) 8.9 - 10.3 mg/dL   Total Protein 8.1 6.5 - 8.1 g/dL   Albumin 3.1 (L) 3.5 - 5.0 g/dL   AST 16 15 - 41 U/L   ALT 8 (L) 17 - 63 U/L   Alkaline Phosphatase 129 (H) 38 - 126 U/L   Total Bilirubin 1.7 (H) 0.3 - 1.2 mg/dL   GFR calc non Af Amer 6 (L) >60 mL/min   GFR calc Af Amer 7 (L) >60 mL/min    Comment: (NOTE) The eGFR has been calculated using the CKD EPI equation. This calculation has not been validated in all clinical situations. eGFR's persistently <60 mL/min signify possible Chronic Kidney Disease.    Anion gap 16 (H) 5 - 15  Protime-INR     Status: Abnormal   Collection Time: 08/29/16  4:55 PM  Result Value Ref Range   Prothrombin Time 16.5 (H) 11.4 - 15.2 seconds   INR 1.32   I-Stat CG4 Lactic Acid, ED     Status: None   Collection Time: 08/29/16  5:14 PM  Result Value Ref Range    Lactic Acid, Venous 1.04 0.5 - 1.9 mmol/L  Glucose, capillary     Status: None   Collection Time: 08/29/16  9:28 PM  Result Value Ref Range   Glucose-Capillary 79 65 - 99 mg/dL    Ct Abdomen Pelvis Wo Contrast  Result Date: 08/29/2016 CLINICAL DATA:  Right lower quadrant pain with nausea and vomiting. End-stage renal disease. Kidney transplant. EXAM: CT ABDOMEN AND PELVIS WITHOUT CONTRAST TECHNIQUE: Multidetector CT imaging of the abdomen and pelvis was performed following the standard protocol without IV contrast. COMPARISON:  CT scans dated 06/28/2016 and 07/05/2014 FINDINGS: Lower chest: Chronic cardiomegaly. Calcified lymph nodes in the middle mediastinum, stable. Hepatobiliary: Small calcified granulomas in the liver. Sludge or tiny stones in the gallbladder. The gallbladder wall is not thickened and there are no dilated bile ducts. Pancreas: Normal. Spleen: Normal. Adrenals/Urinary Tract: Chronic atrophy of both kidneys with extensive calcifications in both kidneys, stable. Marked enlargement and inhomogeneous density of the right transplant kidney. Soft tissue stranding around the kidney persists. No appreciable dilatation of the renal collecting system. The soft tissue stranding around the transplant has slightly increased. The differential diagnosis remains pyelonephritis, chronic graft rejection or posttransplant lymphoproliferative disorder. Stomach/Bowel: Normal. Vascular/Lymphatic: Aortic atherosclerosis. Numerous small periaortic lymph nodes which are unchanged and felt to be benign reactive nodes. Reproductive: No acute abnormality. Extensive vascular calcifications throughout the pelvis. Other: No free air. Musculoskeletal: No significant abnormality. IMPRESSION: Persistent and slightly progressed perinephric inflammation around the diffusely edematous transplant kidney in the right lower quadrant. Differential diagnosis remains pyelonephritis, chronic graft rejection, or post transplant  lymphoproliferative disorder. Aortic atherosclerosis. Electronically Signed   By: Lorriane Shire M.D.   On: 08/29/2016 17:35     Dwana Melena, MD 08/29/2016, 9:48 PM

## 2016-08-29 NOTE — ED Notes (Signed)
Pt states he does not make urine, unable to give us a urine sample, informed Brian-RN.

## 2016-08-30 DIAGNOSIS — Z8249 Family history of ischemic heart disease and other diseases of the circulatory system: Secondary | ICD-10-CM | POA: Diagnosis not present

## 2016-08-30 DIAGNOSIS — Z992 Dependence on renal dialysis: Secondary | ICD-10-CM | POA: Diagnosis not present

## 2016-08-30 DIAGNOSIS — R112 Nausea with vomiting, unspecified: Secondary | ICD-10-CM | POA: Diagnosis not present

## 2016-08-30 DIAGNOSIS — E8889 Other specified metabolic disorders: Secondary | ICD-10-CM | POA: Diagnosis present

## 2016-08-30 DIAGNOSIS — I5022 Chronic systolic (congestive) heart failure: Secondary | ICD-10-CM | POA: Diagnosis present

## 2016-08-30 DIAGNOSIS — Z79899 Other long term (current) drug therapy: Secondary | ICD-10-CM | POA: Diagnosis not present

## 2016-08-30 DIAGNOSIS — Z940 Kidney transplant status: Secondary | ICD-10-CM | POA: Diagnosis not present

## 2016-08-30 DIAGNOSIS — X58XXXA Exposure to other specified factors, initial encounter: Secondary | ICD-10-CM | POA: Diagnosis present

## 2016-08-30 DIAGNOSIS — H109 Unspecified conjunctivitis: Secondary | ICD-10-CM | POA: Diagnosis present

## 2016-08-30 DIAGNOSIS — Z7982 Long term (current) use of aspirin: Secondary | ICD-10-CM | POA: Diagnosis not present

## 2016-08-30 DIAGNOSIS — I132 Hypertensive heart and chronic kidney disease with heart failure and with stage 5 chronic kidney disease, or end stage renal disease: Secondary | ICD-10-CM | POA: Diagnosis present

## 2016-08-30 DIAGNOSIS — I10 Essential (primary) hypertension: Secondary | ICD-10-CM | POA: Diagnosis not present

## 2016-08-30 DIAGNOSIS — T8610 Unspecified complication of kidney transplant: Secondary | ICD-10-CM | POA: Diagnosis not present

## 2016-08-30 DIAGNOSIS — Z87891 Personal history of nicotine dependence: Secondary | ICD-10-CM | POA: Diagnosis not present

## 2016-08-30 DIAGNOSIS — I480 Paroxysmal atrial fibrillation: Secondary | ICD-10-CM | POA: Diagnosis present

## 2016-08-30 DIAGNOSIS — N186 End stage renal disease: Secondary | ICD-10-CM | POA: Diagnosis present

## 2016-08-30 DIAGNOSIS — T8611 Kidney transplant rejection: Secondary | ICD-10-CM | POA: Diagnosis present

## 2016-08-30 DIAGNOSIS — I4581 Long QT syndrome: Secondary | ICD-10-CM | POA: Diagnosis present

## 2016-08-30 DIAGNOSIS — Z7952 Long term (current) use of systemic steroids: Secondary | ICD-10-CM | POA: Diagnosis not present

## 2016-08-30 LAB — RENAL FUNCTION PANEL
ALBUMIN: 3.1 g/dL — AB (ref 3.5–5.0)
ANION GAP: 19 — AB (ref 5–15)
BUN: 23 mg/dL — AB (ref 6–20)
CHLORIDE: 94 mmol/L — AB (ref 101–111)
CO2: 22 mmol/L (ref 22–32)
Calcium: 7.6 mg/dL — ABNORMAL LOW (ref 8.9–10.3)
Creatinine, Ser: 10.52 mg/dL — ABNORMAL HIGH (ref 0.61–1.24)
GFR calc Af Amer: 6 mL/min — ABNORMAL LOW (ref >60)
GFR, EST NON AFRICAN AMERICAN: 5 mL/min — AB (ref >60)
Glucose, Bld: 172 mg/dL — ABNORMAL HIGH (ref 65–99)
PHOSPHORUS: 4.2 mg/dL (ref 2.5–4.6)
POTASSIUM: 5.2 mmol/L — AB (ref 3.5–5.1)
Sodium: 135 mmol/L (ref 135–145)

## 2016-08-30 LAB — CBC
HEMATOCRIT: 35.6 % — AB (ref 39.0–52.0)
HEMOGLOBIN: 11.1 g/dL — AB (ref 13.0–17.0)
MCH: 26.9 pg (ref 26.0–34.0)
MCHC: 31.2 g/dL (ref 30.0–36.0)
MCV: 86.4 fL (ref 78.0–100.0)
Platelets: 220 10*3/uL (ref 150–400)
RBC: 4.12 MIL/uL — ABNORMAL LOW (ref 4.22–5.81)
RDW: 19.1 % — AB (ref 11.5–15.5)
WBC: 8.2 10*3/uL (ref 4.0–10.5)

## 2016-08-30 LAB — MRSA PCR SCREENING: MRSA by PCR: POSITIVE — AB

## 2016-08-30 MED ORDER — SODIUM CHLORIDE 0.9 % IV SOLN: 100.0000 mL | INTRAVENOUS | Status: DC | PRN

## 2016-08-30 MED ORDER — AMIODARONE HCL 200 MG PO TABS
200.0000 mg | ORAL_TABLET | Freq: Every day | ORAL | Status: DC
  Administered 2016-08-30 – 2016-08-31 (×2): 200 mg via ORAL
  Filled 2016-08-30 (×2): qty 1

## 2016-08-30 MED ORDER — CALCITRIOL 0.5 MCG PO CAPS
1.2500 ug | ORAL_CAPSULE | Freq: Two times a day (BID) | ORAL | Status: DC
  Administered 2016-08-30 – 2016-08-31 (×3): 1.25 ug via ORAL
  Filled 2016-08-30 (×3): qty 1

## 2016-08-30 MED ORDER — HEPARIN SODIUM (PORCINE) 1000 UNIT/ML DIALYSIS: 1000.0000 [IU] | INTRAMUSCULAR | Status: DC | PRN

## 2016-08-30 MED ORDER — SODIUM CHLORIDE 0.9% FLUSH
3.0000 mL | Freq: Two times a day (BID) | INTRAVENOUS | Status: DC
  Administered 2016-08-30 – 2016-08-31 (×3): 3 mL via INTRAVENOUS

## 2016-08-30 MED ORDER — POLYMYXIN B-TRIMETHOPRIM 10000-0.1 UNIT/ML-% OP SOLN
1.0000 [drp] | Freq: Two times a day (BID) | OPHTHALMIC | Status: DC
  Administered 2016-08-30: 1 [drp] via OPHTHALMIC
  Filled 2016-08-30: qty 10

## 2016-08-30 MED ORDER — DARBEPOETIN ALFA 25 MCG/0.42ML IJ SOSY: 25.0000 ug | PREFILLED_SYRINGE | INTRAMUSCULAR | Status: DC

## 2016-08-30 MED ORDER — SODIUM THIOSULFATE 25 % IV SOLN: 12.5000 g | INTRAVENOUS | Status: DC

## 2016-08-30 MED ORDER — LIDOCAINE HCL 2 % EX GEL: 1.0000 "application " | Freq: Two times a day (BID) | CUTANEOUS | Status: DC | PRN

## 2016-08-30 MED ORDER — SODIUM CHLORIDE 0.9% FLUSH: 3.0000 mL | INTRAVENOUS | Status: DC | PRN

## 2016-08-30 MED ORDER — LIDOCAINE-PRILOCAINE 2.5-2.5 % EX CREA: 1.0000 "application " | TOPICAL_CREAM | CUTANEOUS | Status: DC | PRN

## 2016-08-30 MED ORDER — CALCIUM CARBONATE ANTACID 500 MG PO CHEW
4.0000 | CHEWABLE_TABLET | Freq: Three times a day (TID) | ORAL | Status: DC
  Administered 2016-08-30 – 2016-08-31 (×3): 800 mg via ORAL
  Filled 2016-08-30 (×3): qty 4

## 2016-08-30 MED ORDER — ACETAMINOPHEN 500 MG PO TABS
500.0000 mg | ORAL_TABLET | Freq: Four times a day (QID) | ORAL | Status: DC | PRN
  Administered 2016-08-30: 500 mg via ORAL
  Filled 2016-08-30: qty 1

## 2016-08-30 MED ORDER — SENNOSIDES-DOCUSATE SODIUM 8.6-50 MG PO TABS: 1.0000 | ORAL_TABLET | Freq: Every evening | ORAL | Status: DC | PRN

## 2016-08-30 MED ORDER — METHYLPREDNISOLONE SODIUM SUCC 125 MG IJ SOLR
125.0000 mg | Freq: Every day | INTRAMUSCULAR | Status: AC
  Administered 2016-08-31: 125 mg via INTRAVENOUS
  Filled 2016-08-30 (×2): qty 2

## 2016-08-30 MED ORDER — SODIUM CHLORIDE 0.9 % IV SOLN: 250.0000 mL | INTRAVENOUS | Status: DC | PRN

## 2016-08-30 MED ORDER — PENTAFLUOROPROP-TETRAFLUOROETH EX AERO
1.0000 | INHALATION_SPRAY | CUTANEOUS | Status: DC | PRN
Start: 2016-08-30 — End: 2016-08-30

## 2016-08-30 MED ORDER — LIDOCAINE HCL (PF) 1 % IJ SOLN: 5.0000 mL | INTRAMUSCULAR | Status: DC | PRN

## 2016-08-30 MED ORDER — HEPARIN SODIUM (PORCINE) 1000 UNIT/ML DIALYSIS: 2000.0000 [IU] | Freq: Once | INTRAMUSCULAR | Status: DC

## 2016-08-30 MED ORDER — HEPARIN SODIUM (PORCINE) 5000 UNIT/ML IJ SOLN
5000.0000 [IU] | Freq: Three times a day (TID) | INTRAMUSCULAR | Status: DC
  Administered 2016-08-30: 5000 [IU] via SUBCUTANEOUS
  Filled 2016-08-30 (×2): qty 1

## 2016-08-30 MED ORDER — BACITRACIN-NEOMYCIN-POLYMYXIN OINTMENT TUBE
1.0000 "application " | TOPICAL_OINTMENT | Freq: Two times a day (BID) | CUTANEOUS | Status: DC
  Administered 2016-08-30 (×2): 1 via TOPICAL
  Filled 2016-08-30: qty 15

## 2016-08-30 MED ORDER — METOPROLOL TARTRATE 25 MG PO TABS
25.0000 mg | ORAL_TABLET | Freq: Two times a day (BID) | ORAL | Status: DC
  Administered 2016-08-30 – 2016-08-31 (×3): 25 mg via ORAL
  Filled 2016-08-30 (×3): qty 1

## 2016-08-30 MED ORDER — ASPIRIN 325 MG PO TABS
325.0000 mg | ORAL_TABLET | Freq: Every day | ORAL | Status: DC
  Administered 2016-08-30 (×2): 325 mg via ORAL
  Filled 2016-08-30 (×2): qty 1

## 2016-08-30 NOTE — Progress Notes (Signed)
Initial Nutrition Assessment  DOCUMENTATION CODES:   Not applicable  INTERVENTION:  Continue Nepro Shake po BID, each supplement provides 425 kcal and 19 grams protein.  Encourage adequate PO intake.   NUTRITION DIAGNOSIS:   Increased nutrient needs related to chronic illness as evidenced by estimated needs.  GOAL:   Patient will meet greater than or equal to 90% of their needs  MONITOR:   PO intake, Supplement acceptance, Labs, Weight trends, Skin, I & O's  REASON FOR ASSESSMENT:   Malnutrition Screening Tool    ASSESSMENT:   55 y.o. male presenting with fatigue and nausea. PMH is significant for hypertension, paroxysmal A. fib, end-stage renal disease on dialysis, right renal transplant, hypercalcemia status post parathyroidectomy. CT abdomen showed persistent and slightly progressed perinephric inflammation around the transplanted kidney in RLQ. Patient afebrile and without leukocytosis at admission. Picture most consistent with acute transplant rejection.  He also has biopsy proven calciphylaxis. Per MD note, pt needs nephrectomy when calciphylaxis under control.  Pt reports appetite has been improving. Meal completion has been 100%. Pt reports usual consumption of at least 6 small meals a day with snacks in between. Pt does report consuming Nepro Shake especially if po intake is poor or pt is sick. Pt reports usual body weight of ~168 lbs. Pt with a 7% weight loss. Weight loss may be related to fluid status. Pt currently has Nepro Shake ordered. RD to continue with current orders.   Nutrition-Focused physical exam completed. Findings are no fat depletion, moderate muscle depletion, and no edema.   Labs and medications reviewed. Potassium elevated at 5.2.   Diet Order:  Diet renal/carb modified with fluid restriction Diet-HS Snack? Nothing; Room service appropriate? Yes; Fluid consistency: Thin  Skin:  Reviewed, no issues  Last BM:  Unknown  Height:   Ht Readings  from Last 1 Encounters:  08/29/16 5\' 9"  (1.753 m)    Weight:   Wt Readings from Last 1 Encounters:  08/30/16 156 lb 8.4 oz (71 kg)    Ideal Body Weight:  72.7 kg  BMI:  Body mass index is 23.11 kg/m.  Estimated Nutritional Needs:   Kcal:  2000-2200  Protein:  95-110 grams  Fluid:  1.2 L/day  EDUCATION NEEDS:   No education needs identified at this time  Roslyn SmilingStephanie Autym Siess, MS, RD, LDN Pager # [phone] After hours/ weekend pager # [phone]

## 2016-08-30 NOTE — Progress Notes (Signed)
Family Medicine Teaching Service Daily Progress Note Intern Pager: [phone]  Patient name: Samuel Rowland Medical record number: [id number] Date of birth: 07/18/1961 Age: 55 y.o. Gender: male  Primary Care Provider: Lucilla Edin, MD Consultants: Nephrology  Code Status: FULL  Pt Overview and Major Events to Date:  Admit 10/9  Assessment and Plan: Samuel Rowland is a 55 y.o. male presenting with fatigue and nausea. PMH is significant for hypertension, paroxysmal A. fib, end-stage renal disease on dialysis, right renal transplant, hypercalcemia status post parathyroidectomy  Abdominal pain and nausea Picture consistent with previous admissions for subacute transplant rejection, however patient is afebrile, but reported fevers to 101 at home. No leukocytosis.  Differential diagnosis remains pyelonephritis, chronic graft rejection, or post transplant lymphoproliferative disorder. CT abdomen showed persistent and slightly progressed perinephric inflammation around the transplanted kidney in RLQ. Patient afebrile and without leukocytosis at admission. Picture most consistent with transplant rejection.  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 T/TH/Sat dialysis.  Will receive dialysis tomorrow.  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 Stable from last admission.   - consider wound care c/s - lidocaine gel PRN pain and triple antibiotic ointment  -patient was not taking  narcotics at admission, pain control as needed   Hypocalcemia Calcium 7.5 (8.2 corrected) upon admission. Corrected Ca goal of 8-9 and uncorrected goal of 7-8 in setting of calcific uremic arteriolopathy. Patient underwent a parathyroidectomy on 6/28 with removal of 3 and 1/2 glands.  Hypocalcemia most likely 2/2 parathyroidectomy and inability to take meds.  - Nephrology consulted for HD, appreciate assistance. - Follow renal panel  - 1.25 mcg bid calcitriol - Tums 800mg  in between meals and qHS  Paroxysmal Atrial Fibrillation. Chadsvasc1. 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  Stable - Metoprolol 25 mg - Continue to monitor  HFrEF: TEE 12/16 showed EF 30-35%, right ventricle mildly dilated, diffuse calcification of aortic valve, aortic valve sclerosis, moderate TR. No signs of volume overload at admission.   Conjunctivitis: Bilateral. Appears concerning for bacterial infection given history and exam findings of purulent drainage. Was seen at urgent care and given trimethoprim-polymyxin.  -trimethoprim-polymyxin ordered, if patient does not experience relief with this could try alternate anti-bacterial agent and other supportive care measures such as lubricating eye drops   Qtc Prolongation: Present at last admission with Qtc of 557.  -avoid QTc prolonging medications such as Zofran -repeat EKG in AM   FEN/GI: renal diet; SLIV  PPx: Heparin   Disposition: Dispo pending clinical improvement.  Subjective:  Patient states he is not currently experiencing nausea or vomiting.  Got Hemodialysis today.   No other complaints at this time.  Objective: Temp:  [94.1 F (34.5 C)-100 F (37.8 C)] 97.4 F (36.3 C) (10/10 0800) Pulse Rate:  [59-79] (P) 62 (10/10 0915) Resp:  [15-22] 16 (10/10 0800) BP: (123-193)/(72-106) 178/95 (10/10  0900) SpO2:  [96 %-100 %]  100 % (10/10 0800) Weight:  [158 lb 15.2 oz (72.1 kg)-166 lb 3.6 oz (75.4 kg)] 158 lb 15.2 oz (72.1 kg) (10/10 0800) Physical Exam: General:  55yo ill appearing male lying in bed in NAD Eyes: purulent discharge in left eye, PERRL, EOMI  Neck: Scar present , no LAD Cardiovascular: RRR, 2/6 holosystolic murmur, no gallops or rubs, left AV fistula with palpable thrill  Respiratory: normal WOB, CTABL, no wheezing or rhonchi.  Gastrointestinal: +BS, soft, tenderness on right side with RLQ palpable mass  MSK: FROMx4 Derm: calcium deposits in legs and scrotum Neuro: AAOx3 Psych: normal mood and affect  Laboratory:  Recent Labs Lab 08/29/16 1323 08/30/16 0502  WBC 6.9 8.2  HGB 10.8* 11.1*  HCT 35.1* 35.6*  PLT 229 220    Recent Labs Lab 08/29/16 1323 08/30/16 0502  NA 135 135  K 4.2 5.2*  CL 96* 94*  CO2 23 22  BUN 18 23*  CREATININE 9.10* 10.52*  CALCIUM 7.5* 7.6*  PROT 8.1  --   BILITOT 1.7*  --   ALKPHOS 129*  --   ALT 8*  --   AST 16  --   GLUCOSE 108* 172*   Imaging/Diagnostic Tests: Ct Abdomen Pelvis Wo Contrast  Result Date: 08/29/2016 CLINICAL DATA:  Right lower quadrant pain with nausea and vomiting. End-stage renal disease. Kidney transplant. EXAM: CT ABDOMEN AND PELVIS WITHOUT CONTRAST TECHNIQUE: Multidetector CT imaging of the abdomen and pelvis was performed following the standard protocol without IV contrast. COMPARISON:  CT scans dated 06/28/2016 and 07/05/2014 FINDINGS: Lower chest: Chronic cardiomegaly. Calcified lymph nodes in the middle mediastinum, stable. Hepatobiliary: Small calcified granulomas in the liver. Sludge or tiny stones in the gallbladder. The gallbladder wall is not thickened and there are no dilated bile ducts. Pancreas: Normal. Spleen: Normal. Adrenals/Urinary Tract: Chronic atrophy of both kidneys with extensive calcifications in both kidneys, stable. Marked enlargement and inhomogeneous density of the right  transplant kidney. Soft tissue stranding around the kidney persists. No appreciable dilatation of the renal collecting system. The soft tissue stranding around the transplant has slightly increased. The differential diagnosis remains pyelonephritis, chronic graft rejection or posttransplant lymphoproliferative disorder. Stomach/Bowel: Normal. Vascular/Lymphatic: Aortic atherosclerosis. Numerous small periaortic lymph nodes which are unchanged and felt to be benign reactive nodes. Reproductive: No acute abnormality. Extensive vascular calcifications throughout the pelvis. Other: No free air. Musculoskeletal: No significant abnormality. IMPRESSION: Persistent and slightly progressed perinephric inflammation around the diffusely edematous transplant kidney in the right lower quadrant. Differential diagnosis remains pyelonephritis, chronic graft rejection, or post transplant lymphoproliferative disorder. Aortic atherosclerosis. Electronically Signed   By: Francene BoyersJames  Maxwell M.D.   On: 08/29/2016 17:35   Samuel MarchYashika Tyannah Sane, MD 08/30/2016, 9:40 AM PGY-1, [facility] FPTS Intern pager: [phone], text pages welcome

## 2016-08-30 NOTE — Progress Notes (Signed)
S: Still with RLQ abd pain and decreased appetite O:BP (!) 163/85 (BP Location: Right Arm)   Pulse 64   Temp 97.7 F (36.5 C) (Oral)   Resp 14   Ht 5\' 9"  (1.753 m)   Wt 72.1 kg (158 lb 15.2 oz)   SpO2 100%   BMI 23.47 kg/m   Intake/Output Summary (Last 24 hours) at 08/30/16 1238 Last data filed at 08/30/16 1213  Gross per 24 hour  Intake                0 ml  Output              486 ml  Net             -486 ml   Weight change:  EAV:WUJWJ and alert CVS:RRR Resp:clear Abd:+ RLQ tenderness over tx kidney Ext: No edema.  Healing calciphylactic lesions over post calf.  LUA AVF + bruit NEURO:CNI M&SI Ox3   . amiodarone  200 mg Oral Daily  . aspirin  325 mg Oral QHS  . calcitRIOL  1.25 mcg Oral BID  . calcium carbonate  4 tablet Oral TID PC  . feeding supplement (NEPRO CARB STEADY)  237 mL Oral BID BM  . heparin  2,000 Units Dialysis Once in dialysis  . heparin  5,000 Units Subcutaneous Q8H  . methylPREDNISolone (SOLU-MEDROL) injection  125 mg Intravenous Once  . metoprolol tartrate  25 mg Oral BID  . neomycin-bacitracin-polymyxin  1 application Topical BID  . sodium chloride flush  3 mL Intravenous Q12H  . trimethoprim-polymyxin b  1 drop Both Eyes BID   Ct Abdomen Pelvis Wo Contrast  Result Date: 08/29/2016 CLINICAL DATA:  Right lower quadrant pain with nausea and vomiting. End-stage renal disease. Kidney transplant. EXAM: CT ABDOMEN AND PELVIS WITHOUT CONTRAST TECHNIQUE: Multidetector CT imaging of the abdomen and pelvis was performed following the standard protocol without IV contrast. COMPARISON:  CT scans dated 06/28/2016 and 07/05/2014 FINDINGS: Lower chest: Chronic cardiomegaly. Calcified lymph nodes in the middle mediastinum, stable. Hepatobiliary: Small calcified granulomas in the liver. Sludge or tiny stones in the gallbladder. The gallbladder wall is not thickened and there are no dilated bile ducts. Pancreas: Normal. Spleen: Normal. Adrenals/Urinary Tract: Chronic  atrophy of both kidneys with extensive calcifications in both kidneys, stable. Marked enlargement and inhomogeneous density of the right transplant kidney. Soft tissue stranding around the kidney persists. No appreciable dilatation of the renal collecting system. The soft tissue stranding around the transplant has slightly increased. The differential diagnosis remains pyelonephritis, chronic graft rejection or posttransplant lymphoproliferative disorder. Stomach/Bowel: Normal. Vascular/Lymphatic: Aortic atherosclerosis. Numerous small periaortic lymph nodes which are unchanged and felt to be benign reactive nodes. Reproductive: No acute abnormality. Extensive vascular calcifications throughout the pelvis. Other: No free air. Musculoskeletal: No significant abnormality. IMPRESSION: Persistent and slightly progressed perinephric inflammation around the diffusely edematous transplant kidney in the right lower quadrant. Differential diagnosis remains pyelonephritis, chronic graft rejection, or post transplant lymphoproliferative disorder. Aortic atherosclerosis. Electronically Signed   By: Francene Boyers M.D.   On: 08/29/2016 17:35   BMET    Component Value Date/Time   NA 135 08/30/2016 0502   K 5.2 (H) 08/30/2016 0502   CL 94 (L) 08/30/2016 0502   CO2 22 08/30/2016 0502   GLUCOSE 172 (H) 08/30/2016 0502   BUN 23 (H) 08/30/2016 0502   CREATININE 10.52 (H) 08/30/2016 0502   CALCIUM 7.6 (L) 08/30/2016 0502   GFRNONAA 5 (L) 08/30/2016 0502   GFRAA  6 (L) 08/30/2016 0502   CBC    Component Value Date/Time   WBC 8.2 08/30/2016 0502   RBC 4.12 (L) 08/30/2016 0502   HGB 11.1 (L) 08/30/2016 0502   HCT 35.6 (L) 08/30/2016 0502   PLT 220 08/30/2016 0502   MCV 86.4 08/30/2016 0502   MCH 26.9 08/30/2016 0502   MCHC 31.2 08/30/2016 0502   RDW 19.1 (H) 08/30/2016 0502   LYMPHSABS 2.6 05/31/2016 1024   MONOABS 1.4 (H) 05/31/2016 1024   EOSABS 0.9 (H) 05/31/2016 1024   BASOSABS 0.1 05/31/2016 1024   HD  orders: TTS BFR 350/DFR 800, 16g needles, 4hr, DW 76.4  Assessment: 1. Abd Pain most likely secondary to chronically rejecting tx kidney 2. ESRD On HD TTS at [facility] 3. HTN 4. Anemia 5. Calciphylaxis  Plan: 1. Will give 3rd dose solumedrol tomorrow 2. Will get info from his Dx unit 3.  He needs to get [facility].  Pt getting frustrated over recurrent pain and illness from the rejecting transplant.     Samuel Rowland

## 2016-08-31 DIAGNOSIS — I10 Essential (primary) hypertension: Secondary | ICD-10-CM

## 2016-08-31 DIAGNOSIS — T8611 Kidney transplant rejection: Principal | ICD-10-CM

## 2016-08-31 DIAGNOSIS — T8610 Unspecified complication of kidney transplant: Secondary | ICD-10-CM

## 2016-08-31 DIAGNOSIS — R112 Nausea with vomiting, unspecified: Secondary | ICD-10-CM

## 2016-08-31 LAB — CBC
HCT: 32.5 % — ABNORMAL LOW (ref 39.0–52.0)
Hemoglobin: 10.3 g/dL — ABNORMAL LOW (ref 13.0–17.0)
MCH: 26.8 pg (ref 26.0–34.0)
MCHC: 31.7 g/dL (ref 30.0–36.0)
MCV: 84.6 fL (ref 78.0–100.0)
PLATELETS: 235 10*3/uL (ref 150–400)
RBC: 3.84 MIL/uL — ABNORMAL LOW (ref 4.22–5.81)
RDW: 19.2 % — AB (ref 11.5–15.5)
WBC: 17.2 10*3/uL — AB (ref 4.0–10.5)

## 2016-08-31 LAB — BASIC METABOLIC PANEL
ANION GAP: 14 (ref 5–15)
BUN: 22 mg/dL — ABNORMAL HIGH (ref 6–20)
CALCIUM: 8.3 mg/dL — AB (ref 8.9–10.3)
CO2: 27 mmol/L (ref 22–32)
Chloride: 95 mmol/L — ABNORMAL LOW (ref 101–111)
Creatinine, Ser: 6.68 mg/dL — ABNORMAL HIGH (ref 0.61–1.24)
GFR, EST AFRICAN AMERICAN: 10 mL/min — AB (ref >60)
GFR, EST NON AFRICAN AMERICAN: 8 mL/min — AB (ref >60)
GLUCOSE: 196 mg/dL — AB (ref 65–99)
Potassium: 3.7 mmol/L (ref 3.5–5.1)
SODIUM: 136 mmol/L (ref 135–145)

## 2016-08-31 MED ORDER — PREDNISONE 20 MG PO TABS: 40.0000 mg | ORAL_TABLET | Freq: Every day | ORAL | 0 refills | Status: AC

## 2016-08-31 NOTE — Progress Notes (Signed)
Samuel Rowland to be D/C'd Home per MD order.  Discussed prescriptions and follow up appointments with the patient. Prescriptions given to patient, medication list explained in detail. Pt verbalized understanding.    Medication List    TAKE these medications   acetaminophen 500 MG tablet Commonly known as:  TYLENOL Take 500 mg by mouth every 6 (six) hours as needed for headache (pain).   albuterol 108 (90 Base) MCG/ACT inhaler Commonly known as:  PROVENTIL HFA;VENTOLIN HFA Inhale 2 puffs into the lungs every 6 (six) hours as needed for wheezing or shortness of breath.   amiodarone 400 MG tablet Commonly known as:  PACERONE Take 1 tablet (400 mg total) by mouth daily. What changed:  how much to take  when to take this   aspirin 325 MG tablet Take 325 mg by mouth at bedtime.   calcitRIOL 0.25 MCG capsule Commonly known as:  ROCALTROL Take 1.25 mcg by mouth 2 (two) times daily.   calcium carbonate 500 MG chewable tablet Commonly known as:  TUMS - dosed in mg elemental calcium Chew 4 tablets by mouth 3 (three) times daily after meals.   lidocaine 2 % jelly Commonly known as:  XYLOCAINE Place 1 application into the urethra 2 (two) times daily as needed (pain).   metoprolol tartrate 25 MG tablet Commonly known as:  LOPRESSOR Take 1 tablet (25 mg total) by mouth 2 (two) times daily.   neomycin-bacitracin-polymyxin Oint Commonly known as:  NEOSPORIN Apply 1 application topically 2 (two) times daily.   oxyCODONE-acetaminophen 5-325 MG tablet Commonly known as:  PERCOCET/ROXICET Take 2 tablets by mouth every 4 (four) hours as needed for severe pain.   predniSONE 20 MG tablet Commonly known as:  DELTASONE Take 2 tablets (40 mg total) by mouth daily with breakfast. What changed:  medication strength  how much to take  when to take this   trimethoprim-polymyxin b ophthalmic solution Commonly known as:  POLYTRIM Place 1 drop into both eyes 2 (two) times daily.        Vitals:   08/31/16 1028 08/31/16 1100  BP: (!) 155/87   Pulse: 60 60  Resp:  19  Temp:  97.9 F (36.6 C)    Skin clean, dry and intact without evidence of skin break down, no evidence of skin tears noted. IV catheter discontinued intact. Site without signs and symptoms of complications. Dressing and pressure applied. Pt denies pain at this time. No complaints noted.  An After Visit Summary was printed and given to the patient. Patient escorted via WC, and D/C home via private auto.  Janeann ForehandLuke Naara Kelty BSN, RN

## 2016-08-31 NOTE — Discharge Summary (Signed)
[facility] Discharge Summary  Patient name: Samuel Rowland Medical record number: [id number] Date of birth: [birth year] Age: 55 y.o. Gender: male Date of Admission: 08/29/2016  Date of Discharge: 08/31/2016 Admitting Physician: Blane Ohara McDiarmid, MD  Primary Care Provider: No primary care provider on file. Consultants: Nephrology  Indication for Hospitalization: fatigue and nausea  Discharge Diagnoses/Problem List:  Patient Active Problem List   Diagnosis Date Noted  . Non-intractable vomiting with nausea   . Renal transplant rejection 08/29/2016  . Renal transplant failure and rejection   . ESRD (end stage renal disease) ([place]) 06/29/2016  . Malnutrition of moderate degree 06/29/2016  . Abdominal pain   . Heel ulcer ([place])   . Chronic systolic congestive heart failure ([place])   . Hyperkalemia 06/28/2016  . Hypocalcemia 05/31/2016  . 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 Penile ulcer 05/31/2016  . 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 Scrotal abscess 02/26/2016  . Sebaceous cyst of scrotum 02/26/2016  . Cellulitis, scrotum 02/22/2016  . Abnormal nuclear stress test 12/15/2015  . ESRD on dialysis ([place]) 12/15/2015  . 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 Atrial fibrillation ([place]) [I48.91] 11/17/2015  . Long term (current) use of anticoagulants [Z79.01] 11/17/2015  . Paroxysmal a-fib ([place]) 11/12/2015  . Anemia of chronic kidney failure 11/12/2015  . 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 Acute kidney injury ([place])   . Essential hypertension   . Metabolic acidosis 66/44/0347  . Protein-calorie malnutrition, severe ([place]) 09/20/2014  . AKI (acute kidney injury) ([place]) 09/20/2014  . Renal transplant recipient 09/20/2014  . Prostatitis, acute 09/19/2014   Disposition: Home  Discharge Condition: Stable  Discharge Exam:  General: 55yo AA male lying in bed in NAD Eyes:  PERRL, EOMI Neck:  Supple, no LAD Cardiovascular: RRR, 2/6 holosystolic murmur, no gallops or rubs, left AV fistula with palpable thrill  Respiratory: normal WOB, CTABL, no wheezing or rhonchi.  Gastrointestinal: +BS, soft, tenderness on right side with RLQ palpable mass  MSK: FROMx4 Derm: calcium deposits in legs and scrotum Neuro: AAOx3 Psych: normal mood and affect  Brief Hospital Course:  Samuel Rowland a 55 y.o.malepresenting with fatigue and nausea. PMH is significant for hypertension, paroxysmal A. fib, end-stage renal disease on dialysis, right renal transplant, hypercalcemia status post parathyroidectomy  Picture consistent with previous admissions for subacute transplant rejection.  Patientafebrile, but reported fevers to 101 at home. CT abdomen showed persistent and slightly progressed perinephric inflammation around the transplanted kidney in RLQ. Patient admitted for similar in August 2017. Patient without leukocytosis at admission. Given 125 mg Methylprednisolone in ED, then given Solu-Medrol while admitted.  Nephrology was consulted.  He continued receiving HD while here.  Was given IV compazine.  His nausea and vomiting improved the following day and was able to start eating.  Per Nephrology recommendations, cleared to discharge on Prednisone 40 mg and appointment made to see Dr. Vida Rigger on October 16th at 10 am at [facility].  At time of discharge, patient felt much improved and was stable.  Issues for Follow Up:  1. Will need to follow up with [facility] for nephrectomy  Significant Procedures: None  Significant Labs and Imaging:   Recent Labs Lab 08/29/16 1323 08/30/16 0502 08/31/16 0426  WBC 6.9 8.2 17.2*  HGB 10.8* 11.1* 10.3*  HCT 35.1* 35.6* 32.5*  PLT 229 220 235    Recent Labs Lab 08/29/16 1323 08/30/16 0502 08/31/16 0426  NA 135 135 136  K 4.2 5.2*  3.7  CL 96* 94* 95*  CO2 23 22 27   GLUCOSE 108* 172* 196*  BUN 18 23* 22*  CREATININE 9.10* 10.52* 6.68*   CALCIUM 7.5* 7.6* 8.3*  PHOS  --  4.2  --   ALKPHOS 129*  --   --   AST 16  --   --   ALT 8*  --   --   ALBUMIN 3.1* 3.1*  --    Results/Tests Pending at Time of Discharge: None  Discharge Medications:    Medication List    TAKE these medications   acetaminophen 500 MG tablet Commonly known as:  TYLENOL Take 500 mg by mouth every 6 (six) hours as needed for headache (pain).   albuterol 108 (90 Base) MCG/ACT inhaler Commonly known as:  PROVENTIL HFA;VENTOLIN HFA Inhale 2 puffs into the lungs every 6 (six) hours as needed for wheezing or shortness of breath.   amiodarone 400 MG tablet Commonly known as:  PACERONE Take 1 tablet (400 mg total) by mouth daily. What changed:  how much to take  when to take this   aspirin 325 MG tablet Take 325 mg by mouth at bedtime.   calcitRIOL 0.25 MCG capsule Commonly known as:  ROCALTROL Take 1.25 mcg by mouth 2 (two) times daily.   calcium carbonate 500 MG chewable tablet Commonly known as:  TUMS - dosed in mg elemental calcium Chew 4 tablets by mouth 3 (three) times daily after meals.   lidocaine 2 % jelly Commonly known as:  XYLOCAINE Place 1 application into the urethra 2 (two) times daily as needed (pain).   metoprolol tartrate 25 MG tablet Commonly known as:  LOPRESSOR Take 1 tablet (25 mg total) by mouth 2 (two) times daily.   neomycin-bacitracin-polymyxin Oint Commonly known as:  NEOSPORIN Apply 1 application topically 2 (two) times daily.   oxyCODONE-acetaminophen 5-325 MG tablet Commonly known as:  PERCOCET/ROXICET Take 2 tablets by mouth every 4 (four) hours as needed for severe pain.   predniSONE 20 MG tablet Commonly known as:  DELTASONE Take 2 tablets (40 mg total) by mouth daily with breakfast. What changed:  medication strength  how much to take  when to take this   trimethoprim-polymyxin b ophthalmic solution Commonly known as:  POLYTRIM Place 1 drop into both eyes 2 (two) times daily.       Discharge Instructions: Please refer to Patient Instructions section of EMR for full details.  Patient was counseled important signs and symptoms that should prompt return to medical care, changes in medications, dietary instructions, activity restrictions, and follow up appointments.   Follow-Up Appointments: Follow-up Information    Lucienne Minks, MD Follow up on 09/05/2016.   Specialty:  Surgery Why:  10 am appointment Contact information: [place] [address] [phone]          Lovenia Kim, MD 08/31/2016, 8:39 PM PGY-1, [facility]

## 2016-08-31 NOTE — Progress Notes (Signed)
S: Pain better O:BP (!) 163/84 (BP Location: Right Arm)   Pulse (!) 56   Temp 98.1 F (36.7 C) (Oral)   Resp 18   Ht 5\' 9"  (1.753 m)   Wt 73.8 kg (162 lb 9.6 oz)   SpO2 99%   BMI 24.01 kg/m   Intake/Output Summary (Last 24 hours) at 08/31/16 0956 Last data filed at 08/31/16 0805  Gross per 24 hour  Intake              490 ml  Output              486 ml  Net                4 ml   Weight change: -3.3 kg (-7 lb 4.4 oz) ZOX:WRUEA and alert CVS:RRR Resp:clear Abd: Less tenderness over TX in RLQ Ext: No edema.  Healing calciphylactic lesions over post calf.  LUA AVF + bruit NEURO:CNI M&SI Ox3   . amiodarone  200 mg Oral Daily  . aspirin  325 mg Oral QHS  . calcitRIOL  1.25 mcg Oral BID  . calcium carbonate  4 tablet Oral TID PC  . [START ON 09/01/2016] darbepoetin (ARANESP) injection - DIALYSIS  25 mcg Intravenous Q Thu-HD  . feeding supplement (NEPRO CARB STEADY)  237 mL Oral BID BM  . heparin  5,000 Units Subcutaneous Q8H  . methylPREDNISolone (SOLU-MEDROL) injection  125 mg Intravenous Daily  . metoprolol tartrate  25 mg Oral BID  . neomycin-bacitracin-polymyxin  1 application Topical BID  . sodium chloride flush  3 mL Intravenous Q12H  . [START ON 09/01/2016] sodium thiosulfate infusion for calciphylaxis  12.5 g Intravenous Once per day on Tue Thu Sat  . trimethoprim-polymyxin b  1 drop Both Eyes BID   Ct Abdomen Pelvis Wo Contrast  Result Date: 08/29/2016 CLINICAL DATA:  Right lower quadrant pain with nausea and vomiting. End-stage renal disease. Kidney transplant. EXAM: CT ABDOMEN AND PELVIS WITHOUT CONTRAST TECHNIQUE: Multidetector CT imaging of the abdomen and pelvis was performed following the standard protocol without IV contrast. COMPARISON:  CT scans dated 06/28/2016 and 07/05/2014 FINDINGS: Lower chest: Chronic cardiomegaly. Calcified lymph nodes in the middle mediastinum, stable. Hepatobiliary: Small calcified granulomas in the liver. Sludge or tiny stones in the  gallbladder. The gallbladder wall is not thickened and there are no dilated bile ducts. Pancreas: Normal. Spleen: Normal. Adrenals/Urinary Tract: Chronic atrophy of both kidneys with extensive calcifications in both kidneys, stable. Marked enlargement and inhomogeneous density of the right transplant kidney. Soft tissue stranding around the kidney persists. No appreciable dilatation of the renal collecting system. The soft tissue stranding around the transplant has slightly increased. The differential diagnosis remains pyelonephritis, chronic graft rejection or posttransplant lymphoproliferative disorder. Stomach/Bowel: Normal. Vascular/Lymphatic: Aortic atherosclerosis. Numerous small periaortic lymph nodes which are unchanged and felt to be benign reactive nodes. Reproductive: No acute abnormality. Extensive vascular calcifications throughout the pelvis. Other: No free air. Musculoskeletal: No significant abnormality. IMPRESSION: Persistent and slightly progressed perinephric inflammation around the diffusely edematous transplant kidney in the right lower quadrant. Differential diagnosis remains pyelonephritis, chronic graft rejection, or post transplant lymphoproliferative disorder. Aortic atherosclerosis. Electronically Signed   By: Francene Boyers M.D.   On: 08/29/2016 17:35   BMET    Component Value Date/Time   NA 136 08/31/2016 0426   K 3.7 08/31/2016 0426   CL 95 (L) 08/31/2016 0426   CO2 27 08/31/2016 0426   GLUCOSE 196 (H) 08/31/2016 0426   BUN  22 (H) 08/31/2016 0426   CREATININE 6.68 (H) 08/31/2016 0426   CALCIUM 8.3 (L) 08/31/2016 0426   GFRNONAA 8 (L) 08/31/2016 0426   GFRAA 10 (L) 08/31/2016 0426   CBC    Component Value Date/Time   WBC 17.2 (H) 08/31/2016 0426   RBC 3.84 (L) 08/31/2016 0426   HGB 10.3 (L) 08/31/2016 0426   HCT 32.5 (L) 08/31/2016 0426   PLT 235 08/31/2016 0426   MCV 84.6 08/31/2016 0426   MCH 26.8 08/31/2016 0426   MCHC 31.7 08/31/2016 0426   RDW 19.2 (H)  08/31/2016 0426   LYMPHSABS 2.6 05/31/2016 1024   MONOABS 1.4 (H) 05/31/2016 1024   EOSABS 0.9 (H) 05/31/2016 1024   BASOSABS 0.1 05/31/2016 1024   HD orders: TTS BFR 350/DFR 800, 16g needles, 4hr, DW 76.4  Assessment: 1. Abd Pain most likely secondary to chronically rejecting tx kidney 2. ESRD On HD TTS at [facility] 3. HTN 4. Anemia 5. Calciphylaxis  Plan: 1. Cont with PO steroids. 40mg  q d 2. I have spoken to his Tx surgeon, Dr Mortimer FriesFarney, and nephrologist yest with plan to get in to see the surgeon ASAP if better today vs [facility]  Pt is better today and OK with outpt appt.  I have a call in to Dr Mortimer FriesFarney to get the appt.... Dr Mortimer FriesFarney called back abd says to have pt go to Tx clinic on Monday 09/05/16 at 10AM. Pt aware of Appt 3. OK for DC today    Annaliese Saez T

## 2016-08-31 NOTE — Progress Notes (Signed)
Family Medicine Teaching Service Daily Progress Note Intern Pager: [phone]  Patient name: Samuel Rowland Medical record number: [id number] Date of birth: [birth year] Age: 55 y.o. Gender: male  Primary Care Provider: No primary care provider on file. Consultants: Nephrology  Code Status: FULL  Pt Overview and Major Events to Date:  Admit 10/9  Assessment and Plan: Samuel Rowland is a 55 y.o. male presenting with fatigue and nausea. PMH is significant for hypertension, paroxysmal A. fib, end-stage renal disease on dialysis, right renal transplant, hypercalcemia status post parathyroidectomy  Abdominal pain and nausea Picture consistent with previous admissions for subacute transplant rejection, however patient is afebrile, but reported fevers to 101 at home. No leukocytosis.   CT abdomen showed persistent and slightly progressed perinephric inflammation around the transplanted kidney in RLQ. Patient afebrile and without leukocytosis at admission. Picture most consistent with transplant rejection.  Has not had any nausea or vomiting for the past 2 days and feels much improved.  On exam today, he is back at his baseline and no significant findings.  - consult nephrology, appreciate recs  -Give third dose Solu-Medrol today.  Plan to discharge on Prednisone 40 mg daily.   -Follow up appointment made with Dr. Rolene Course on Oct 16 at 10 am for eval and nephrectomy.   - IV compazine prn for nausea  ESRD Patient with a history of ESRD with transplant in 2010 and subsequent rejection. T/TH/Sat dialysis.  Received HD yesterday. -monitor BMP Cr improved 10.52>6.68 yesterday, K+ 5.2>3.7 - renal supplements  Calcific Uremic Arteriolopathy  Penile and scrotal lesions concerning for calciphylaxis given excruciating pain. Stable from last admission.   - lidocaine gel PRN pain and triple antibiotic ointment  -patient was not taking narcotics at admission, pain control as needed   Hypocalcemia Calcium  7.5 (8.2 corrected) upon admission. Corrected Ca goal of 8-9 and uncorrected goal of 7-8 in setting of calcific uremic arteriolopathy. Patient underwent a parathyroidectomy on 6/28 with removal of 3 and 1/2 glands.  Hypocalcemia most likely 2/2 parathyroidectomy and inability to take meds.  - Nephrology consulted for HD, appreciate assistance. - Follow renal panel  - 1.25 mcg bid calcitriol - Tums 800mg  in between meals and qHS  Paroxysmal Atrial Fibrillation. Chadsvasc1. 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  Stable - Metoprolol 25 mg - Continue to monitor  HFrEF: TEE 12/16 showed EF 30-35%, right ventricle mildly dilated, diffuse calcification of aortic valve, aortic valve sclerosis, moderate TR. No signs of volume overload at admission.   Conjunctivitis: Bilateral. Appears concerning for bacterial infection given history and exam findings of purulent drainage. Was seen at urgent care and given trimethoprim-polymyxin.  -trimethoprim-polymyxin ordered, if patient does not experience relief with this could try alternate anti-bacterial agent and other supportive care measures such as lubricating eye drops   Qtc Prolongation: Present at last admission with Qtc of 557.  -avoid QTc prolonging medications such as Zofran -repeat EKG in AM   FEN/GI: renal diet; SLIV  PPx: Heparin   Disposition: Discharge home today with follow up with [facility].   Subjective:  Patient states he is not currently experiencing nausea or vomiting.  Got Hemodialysis yesterday and feeling well.  No pain at current time and is ready to go home.   Objective: Temp:  [97.4 F (36.3 C)-98.1 F (36.7 C)] 97.9 F (36.6 C) (10/11 1100) Pulse Rate:  [56-77] 60 (  10/11 1100) Resp:  [18-19] 19 (10/11 1100) BP: (155-171)/(74-87) 155/87 (10/11 1028) SpO2:  [95 %-100 %] 100  % (10/11 1100) Weight:  [162 lb 9.6 oz (73.8 kg)] 162 lb 9.6 oz (73.8 kg) (10/10 2117) Physical Exam: General:  55yo AA male lying in bed in NAD Eyes:  PERRL, EOMI Neck: Supple, no LAD Cardiovascular: RRR, 2/6 holosystolic murmur, no gallops or rubs, left AV fistula with palpable thrill  Respiratory: normal WOB, CTABL, no wheezing or rhonchi.  Gastrointestinal: +BS, soft, tenderness on right side with RLQ palpable mass  MSK: FROMx4 Derm: calcium deposits in legs and scrotum Neuro: AAOx3 Psych: normal mood and affect  Laboratory:  Recent Labs Lab 08/29/16 1323 08/30/16 0502 08/31/16 0426  WBC 6.9 8.2 17.2*  HGB 10.8* 11.1* 10.3*  HCT 35.1* 35.6* 32.5*  PLT 229 220 235    Recent Labs Lab 08/29/16 1323 08/30/16 0502 08/31/16 0426  NA 135 135 136  K 4.2 5.2* 3.7  CL 96* 94* 95*  CO2 23 22 27   BUN 18 23* 22*  CREATININE 9.10* 10.52* 6.68*  CALCIUM 7.5* 7.6* 8.3*  PROT 8.1  --   --   BILITOT 1.7*  --   --   ALKPHOS 129*  --   --   ALT 8*  --   --   AST 16  --   --   GLUCOSE 108* 172* 196*   Imaging/Diagnostic Tests: No results found. Samuel MarchYashika Floraine Buechler, MD 08/31/2016, 12:13 PM PGY-1, [facility] FPTS Intern pager: [phone], text pages welcome

## 2016-09-21 DEATH — deceased

## 2016-10-27 ENCOUNTER — Ambulatory Visit: Payer: Self-pay | Admitting: Pharmacist Clinician (PhC)/ Clinical Pharmacy Specialist

## 2016-10-27 DIAGNOSIS — I480 Paroxysmal atrial fibrillation: Secondary | ICD-10-CM
